# Patient Record
Sex: Female | Born: 1950 | Race: White | Hispanic: No | Marital: Single | Smoking: Never smoker
Health system: Southern US, Community
[De-identification: ages and names within clinical notes are randomized; demographics above are authoritative.]

## PROBLEM LIST (undated history)

## (undated) DIAGNOSIS — Z87442 Personal history of urinary calculi: Secondary | ICD-10-CM

## (undated) DIAGNOSIS — Z8709 Personal history of other diseases of the respiratory system: Secondary | ICD-10-CM

## (undated) DIAGNOSIS — M797 Fibromyalgia: Secondary | ICD-10-CM

## (undated) DIAGNOSIS — Z9889 Other specified postprocedural states: Secondary | ICD-10-CM

## (undated) DIAGNOSIS — M549 Dorsalgia, unspecified: Secondary | ICD-10-CM

## (undated) DIAGNOSIS — Z8669 Personal history of other diseases of the nervous system and sense organs: Secondary | ICD-10-CM

## (undated) DIAGNOSIS — K649 Unspecified hemorrhoids: Secondary | ICD-10-CM

## (undated) DIAGNOSIS — Z8489 Family history of other specified conditions: Secondary | ICD-10-CM

## (undated) DIAGNOSIS — H269 Unspecified cataract: Secondary | ICD-10-CM

## (undated) DIAGNOSIS — Z9289 Personal history of other medical treatment: Secondary | ICD-10-CM

## (undated) DIAGNOSIS — G4700 Insomnia, unspecified: Secondary | ICD-10-CM

## (undated) DIAGNOSIS — F329 Major depressive disorder, single episode, unspecified: Secondary | ICD-10-CM

## (undated) DIAGNOSIS — F32A Depression, unspecified: Secondary | ICD-10-CM

## (undated) DIAGNOSIS — Z860100 Personal history of colon polyps, unspecified: Secondary | ICD-10-CM

## (undated) DIAGNOSIS — E039 Hypothyroidism, unspecified: Secondary | ICD-10-CM

## (undated) DIAGNOSIS — M4326 Fusion of spine, lumbar region: Secondary | ICD-10-CM

## (undated) DIAGNOSIS — R202 Paresthesia of skin: Secondary | ICD-10-CM

## (undated) DIAGNOSIS — R200 Anesthesia of skin: Secondary | ICD-10-CM

## (undated) DIAGNOSIS — E119 Type 2 diabetes mellitus without complications: Secondary | ICD-10-CM

## (undated) DIAGNOSIS — E559 Vitamin D deficiency, unspecified: Secondary | ICD-10-CM

## (undated) DIAGNOSIS — K219 Gastro-esophageal reflux disease without esophagitis: Secondary | ICD-10-CM

## (undated) DIAGNOSIS — IMO0001 Reserved for inherently not codable concepts without codable children: Secondary | ICD-10-CM

## (undated) DIAGNOSIS — Z8719 Personal history of other diseases of the digestive system: Secondary | ICD-10-CM

## (undated) DIAGNOSIS — I10 Essential (primary) hypertension: Secondary | ICD-10-CM

## (undated) DIAGNOSIS — R350 Frequency of micturition: Secondary | ICD-10-CM

## (undated) DIAGNOSIS — D693 Immune thrombocytopenic purpura: Secondary | ICD-10-CM

## (undated) DIAGNOSIS — M62838 Other muscle spasm: Secondary | ICD-10-CM

## (undated) DIAGNOSIS — R011 Cardiac murmur, unspecified: Secondary | ICD-10-CM

## (undated) DIAGNOSIS — D649 Anemia, unspecified: Secondary | ICD-10-CM

## (undated) DIAGNOSIS — R112 Nausea with vomiting, unspecified: Secondary | ICD-10-CM

## (undated) DIAGNOSIS — R413 Other amnesia: Secondary | ICD-10-CM

## (undated) DIAGNOSIS — G8929 Other chronic pain: Secondary | ICD-10-CM

## (undated) HISTORY — PX: ABDOMINAL HYSTERECTOMY: SHX81

## (undated) HISTORY — PX: CERVICAL FUSION: SHX112

## (undated) HISTORY — PX: OTHER SURGICAL HISTORY: SHX169

## (undated) HISTORY — PX: CATARACT EXTRACTION, BILATERAL: SHX1313

## (undated) HISTORY — DX: Fusion of spine, lumbar region: M43.26

## (undated) HISTORY — PX: CARDIAC CATHETERIZATION: SHX172

## (undated) HISTORY — DX: Other amnesia: R41.3

## (undated) HISTORY — PX: ESOPHAGOGASTRODUODENOSCOPY: SHX1529

## (undated) HISTORY — PX: JOINT REPLACEMENT: SHX530

## (undated) HISTORY — PX: GASTRIC BYPASS: SHX52

## (undated) HISTORY — PX: LUMBAR FUSION: SHX111

## (undated) HISTORY — PX: KNEE ARTHROSCOPY: SUR90

## (undated) HISTORY — PX: SPINAL CORD STIMULATOR IMPLANT: SHX2422

## (undated) HISTORY — PX: COLONOSCOPY: SHX174

## (undated) HISTORY — PX: TONSILLECTOMY: SUR1361

---

## 2006-11-12 ENCOUNTER — Emergency Department (HOSPITAL_COMMUNITY): Admission: EM | Admit: 2006-11-12 | Discharge: 2006-11-12 | Payer: Self-pay | Admitting: Family Medicine

## 2007-10-09 DIAGNOSIS — Z8709 Personal history of other diseases of the respiratory system: Secondary | ICD-10-CM

## 2007-10-09 HISTORY — DX: Personal history of other diseases of the respiratory system: Z87.09

## 2008-03-31 ENCOUNTER — Inpatient Hospital Stay (HOSPITAL_COMMUNITY): Admission: RE | Admit: 2008-03-31 | Discharge: 2008-04-01 | Payer: Self-pay | Admitting: Neurosurgery

## 2009-02-01 ENCOUNTER — Encounter
Admission: RE | Admit: 2009-02-01 | Discharge: 2009-02-01 | Payer: Self-pay | Admitting: Physical Medicine and Rehabilitation

## 2009-07-26 ENCOUNTER — Ambulatory Visit (HOSPITAL_BASED_OUTPATIENT_CLINIC_OR_DEPARTMENT_OTHER): Admission: RE | Admit: 2009-07-26 | Discharge: 2009-07-26 | Payer: Self-pay | Admitting: Orthopedic Surgery

## 2010-05-25 ENCOUNTER — Ambulatory Visit (HOSPITAL_BASED_OUTPATIENT_CLINIC_OR_DEPARTMENT_OTHER): Admission: RE | Admit: 2010-05-25 | Discharge: 2010-05-25 | Payer: Self-pay | Admitting: Orthopedic Surgery

## 2010-06-16 ENCOUNTER — Ambulatory Visit (HOSPITAL_BASED_OUTPATIENT_CLINIC_OR_DEPARTMENT_OTHER): Admission: RE | Admit: 2010-06-16 | Discharge: 2010-06-16 | Payer: Self-pay | Admitting: Orthopedic Surgery

## 2010-12-21 LAB — CBC
HCT: 34.3 % — ABNORMAL LOW (ref 36.0–46.0)
Hemoglobin: 11.2 g/dL — ABNORMAL LOW (ref 12.0–15.0)
MCHC: 32.7 g/dL (ref 30.0–36.0)
RBC: 4.16 MIL/uL (ref 3.87–5.11)
WBC: 4.4 10*3/uL (ref 4.0–10.5)

## 2010-12-21 LAB — BASIC METABOLIC PANEL
GFR calc Af Amer: >60 mL/min (ref >60)
GFR calc non Af Amer: >60 mL/min (ref >60)
Glucose, Bld: 121 mg/dL — ABNORMAL HIGH (ref 70–99)
Potassium: 4.7 mEq/L (ref 3.5–5.1)
Sodium: 139 mEq/L (ref 135–145)

## 2010-12-22 LAB — POCT I-STAT 4, (NA,K, GLUC, HGB,HCT)
Glucose, Bld: 138 mg/dL — ABNORMAL HIGH (ref 70–99)
HCT: 36 % (ref 36.0–46.0)
Hemoglobin: 12.2 g/dL (ref 12.0–15.0)
Potassium: 4 mEq/L (ref 3.5–5.1)
Sodium: 138 mEq/L (ref 135–145)

## 2010-12-28 ENCOUNTER — Other Ambulatory Visit: Payer: Self-pay | Admitting: Orthopedic Surgery

## 2010-12-28 ENCOUNTER — Other Ambulatory Visit (HOSPITAL_COMMUNITY): Payer: Self-pay | Admitting: Orthopedic Surgery

## 2010-12-28 ENCOUNTER — Ambulatory Visit (HOSPITAL_COMMUNITY)
Admission: RE | Admit: 2010-12-28 | Discharge: 2010-12-28 | Disposition: A | Discharge status: Home / Self Care | Payer: Medicare Other | Source: Ambulatory Visit | Attending: Orthopedic Surgery | Admitting: Orthopedic Surgery

## 2010-12-28 ENCOUNTER — Encounter (HOSPITAL_COMMUNITY): Payer: Medicare Other

## 2010-12-28 DIAGNOSIS — E039 Hypothyroidism, unspecified: Secondary | ICD-10-CM | POA: Insufficient documentation

## 2010-12-28 DIAGNOSIS — Z01818 Encounter for other preprocedural examination: Secondary | ICD-10-CM

## 2010-12-28 DIAGNOSIS — E119 Type 2 diabetes mellitus without complications: Secondary | ICD-10-CM | POA: Insufficient documentation

## 2010-12-28 DIAGNOSIS — K219 Gastro-esophageal reflux disease without esophagitis: Secondary | ICD-10-CM | POA: Insufficient documentation

## 2010-12-28 DIAGNOSIS — R0602 Shortness of breath: Secondary | ICD-10-CM | POA: Insufficient documentation

## 2010-12-28 DIAGNOSIS — M48061 Spinal stenosis, lumbar region without neurogenic claudication: Secondary | ICD-10-CM | POA: Insufficient documentation

## 2010-12-28 LAB — BASIC METABOLIC PANEL
BUN: 9 mg/dL (ref 6–23)
CO2: 25 mEq/L (ref 19–32)
Calcium: 9.7 mg/dL (ref 8.4–10.5)
GFR calc non Af Amer: >60 mL/min (ref >60)
Glucose, Bld: 129 mg/dL — ABNORMAL HIGH (ref 70–99)

## 2010-12-28 LAB — CBC
MCH: 28.3 pg (ref 26.0–34.0)
MCHC: 31.3 g/dL (ref 30.0–36.0)
MCV: 90.3 fL (ref 78.0–100.0)
Platelets: 194 10*3/uL (ref 150–400)
RDW: 17.9 % — ABNORMAL HIGH (ref 11.5–15.5)

## 2010-12-28 LAB — SURGICAL PCR SCREEN: Staphylococcus aureus: NEGATIVE

## 2011-01-02 ENCOUNTER — Inpatient Hospital Stay (HOSPITAL_COMMUNITY): Payer: Medicare Other

## 2011-01-02 ENCOUNTER — Inpatient Hospital Stay (HOSPITAL_COMMUNITY)
Admission: RE | Admit: 2011-01-02 | Discharge: 2011-01-07 | DRG: 491 | Disposition: A | Discharge status: Home Health | Payer: Medicare Other | Source: Ambulatory Visit | Attending: Orthopedic Surgery | Admitting: Orthopedic Surgery

## 2011-01-02 DIAGNOSIS — J45909 Unspecified asthma, uncomplicated: Secondary | ICD-10-CM | POA: Diagnosis present

## 2011-01-02 DIAGNOSIS — I10 Essential (primary) hypertension: Secondary | ICD-10-CM | POA: Diagnosis present

## 2011-01-02 DIAGNOSIS — E039 Hypothyroidism, unspecified: Secondary | ICD-10-CM | POA: Diagnosis present

## 2011-01-02 DIAGNOSIS — E119 Type 2 diabetes mellitus without complications: Secondary | ICD-10-CM | POA: Diagnosis present

## 2011-01-02 DIAGNOSIS — K219 Gastro-esophageal reflux disease without esophagitis: Secondary | ICD-10-CM | POA: Diagnosis present

## 2011-01-02 DIAGNOSIS — M48061 Spinal stenosis, lumbar region without neurogenic claudication: Principal | ICD-10-CM | POA: Diagnosis present

## 2011-01-02 DIAGNOSIS — Z01812 Encounter for preprocedural laboratory examination: Secondary | ICD-10-CM

## 2011-01-03 LAB — GLUCOSE, CAPILLARY
Glucose-Capillary: 146 mg/dL — ABNORMAL HIGH (ref 70–99)
Glucose-Capillary: 176 mg/dL — ABNORMAL HIGH (ref 70–99)

## 2011-01-04 LAB — GLUCOSE, CAPILLARY
Glucose-Capillary: 173 mg/dL — ABNORMAL HIGH (ref 70–99)
Glucose-Capillary: 118 mg/dL — ABNORMAL HIGH (ref 70–99)
Glucose-Capillary: 113 mg/dL — ABNORMAL HIGH (ref 70–99)

## 2011-01-05 LAB — GLUCOSE, CAPILLARY
Glucose-Capillary: 110 mg/dL — ABNORMAL HIGH (ref 70–99)
Glucose-Capillary: 131 mg/dL — ABNORMAL HIGH (ref 70–99)

## 2011-01-06 LAB — GLUCOSE, CAPILLARY
Glucose-Capillary: 141 mg/dL — ABNORMAL HIGH (ref 70–99)
Glucose-Capillary: 115 mg/dL — ABNORMAL HIGH (ref 70–99)

## 2011-01-07 LAB — GLUCOSE, CAPILLARY: Glucose-Capillary: 116 mg/dL — ABNORMAL HIGH (ref 70–99)

## 2011-01-09 NOTE — Op Note (Signed)
  [name], [name]               ACCOUNT NO.:  [id number]  MEDICAL RECORD NO.:  [id number]           PATIENT TYPE:  I  LOCATION:  1616                         FACILITY:  [facility]  PHYSICIAN:  Marlowe Kays, M.D.  DATE OF BIRTH:  01-Apr-1951  DATE OF PROCEDURE:  01/02/2011 DATE OF DISCHARGE:                              OPERATIVE REPORT   PREOPERATIVE DIAGNOSIS:  Spinal stenosis L2-L3, L3-L4, L4-L5.  POSTOPERATIVE DIAGNOSIS:  Spinal stenosis L2-L3, L3-L4, L4-L5.  OPERATION:  Decompressive laminectomy L2-L3, L3-L4, L4-L5.  SURGEON:  Marlowe Kays, MD  ASSISTANT:  Georges Lynch. Gioffre, MD  ANESTHESIA:  General.  JUSTIFICATION FOR PROCEDURE:  She is having progressive back and bilateral leg pain, refractory to nonsurgical treatment.  MRIs demonstrated significant spinal stenosis at L4-L5, moderate at L3-L4, slightly less at L2-L3.  At L5-S1, there was some very minimal foraminal stenosis, but no central stenosis.  There was no true disk herniation.  PROCEDURE:  Prophylactic antibiotics, satisfied general anesthesia, Foley catheter inserted, prone position on the Wilson frame.  Time-out performed.  Back was prepped with DuraPrep, draped in sterile field. Ioban employed.  Vertical midline incision with spinous processes what we thought were L2 and L5 tagged with Kocher clamps after a shallow invasive entrance to these areas.  Now, x-ray was taken and confirmed that we were at L2 and at L4.  Accordingly, I extended the incision distalward and we took a second x-ray with Kocher clamps confirming that we are at L2 and L5.  Soft tissue was dissected off the neural arches in this interval and self-retaining McCullough retractors applied.  First with double-action rongeurs, I removed major portion of the spinous process of L2 and superior portion of L5 and the intervening bone as well.  When the dissection became a little more complex, we brought in the microscope and continued it with  2-3 mm Kerrison rongeurs.  Towards the end of the decompression, we used hockey-stick cephalad and a Penfield 4 instrument caudad to check on the extent of our decompression.  At the conclusion of decompression, the spinal canal was widely patent both proximally and distalward and the foramina were all patent to hockey-stick.  Wound was then irrigated with sterile saline and Gelfoam soaked thrombin was placed over the dura.  Self-retaining retractors were removed.  There was good bit of oozing and I elected to use a 0.25 inch Penrose drain to the posterior flank.  We carefully closed the wound with the Penrose drain under direct visualization with interrupted #1 Vicryl and the paralumbar muscle and fascia as well as the deep subcutaneous tissue, 2-0 Vicryl superficial subcutaneous tissue, staples in the skin.  Betadine, Adaptic, dry sterile dressing were applied.  At the time of this dictation, she was on her way to the recovery room in satisfactory addition, with no known complications.  Estimated blood loss 275 cc.  No blood replacement.          ______________________________ Marlowe Kays, M.D.     JA/MEDQ  D:  01/02/2011  T:  01/03/2011  Job:  [id number]  Electronically Signed by Marlowe Kays M.D. on 01/09/2011 03:40:47 PM

## 2011-01-09 NOTE — H&P (Signed)
NAMEWINNI, EHRHARD               ACCOUNT NO.:  [id number]  MEDICAL RECORD NO.:  [id number]           PATIENT TYPE:  LOCATION:                                 FACILITY:  PHYSICIAN:  Marlowe Kays, M.D.  DATE OF BIRTH:  January 24, 1951  DATE OF ADMISSION: DATE OF DISCHARGE:                             HISTORY & PHYSICAL   CHIEF COMPLAINT:  "Pain in my legs with numbness in my left foot."  HISTORY OF PRESENT ILLNESS:  A 60 year old white female who has been seen by [name] for continued breast progressive problems concerning her lumbar spine.  She has had evaluation by our physiatrist, Dr. Ethelene Hal, with epidural steroid injections x2.  Unfortunately, this does not reduce her pain and discomfort.  MRI has shown spinal stenosis, moderate at L3-L4, severe at L4-5 with spondylolisthesis of L5-S1.  Lateral recess stenosis was seen at L2-3.  The stenotic area is her most concern, Dr. Simonne Come discussed with her in detail as well as shared with and shown the films of the MRI.  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  She is quite exhausted with this continuing pain and discomfort, which is essentially "worn her out."  After much discussion including the health and benefits of surgery, it was felt that she would benefit with the surgical procedure and she is being admitted for same.  CURRENT MEDICATIONS: 1. Synthroid 25 mcg tablet daily. 2. Metformin 500 mg tablet 2 tablets by mouth twice a day. 3. Flexeril 10 mg tablet 3 times a day as needed. 4. Vicodin for discomfort. 5. Wellbutrin XL 150 mg tablet daily. 6. Restoril 15 mg tablet p.r.n. h.s. 7. Vitamin D. 8. Ferrous sulfate. 9. Cozaar 100 mg tablet daily. 10.Celexa 40 mg tablets daily. 11.Glucophage 5 mg 1 tablet b.i.d. 12.Neurontin 300 mg caps 3 times a day. 13.Estradiol acetate oral 1 mg p.o. daily. 14.Nexium 40 mg every morning before breakfast. 15.Flonase 50 mcg  actuation nasal spray, 1 spray each nostril daily. 16.Phenergan 25 mg p.r.n. 17.She uses butalbital p.r.n. for migraine headaches. 18.She use ammonium iodide, potassium iodide, topical for p.r.n.     rashes.  Also, 0.75% metronidazole twice a day p.r.n. rash. 19.Zyrtec 10 mg daily. 20.Scopolamine tabs 1 on the dermis every 3rd day p.r.n. cruises.  PAST MEDICAL HISTORY: 1. Positive for intermittent migraines. 2. Difficulty with vision at night. 3. She has depression as well. 4. Hypertension. 5. Heart murmur. 6. Reflux. 7. Hypothyroidism. 8. Frequent urinary tract infections. 9. Diabetes type 2. 10.Chronic anemia.  PAST SURGICAL HISTORY:  Right knee surgeries done in 2011, renal calculi in 2007, broken left arm with open reduction in 2006, tummy tuck in 2005, gastric bypass in 2004, hysterectomy in 2000, tonsillectomy as a child, and tubal ligation in 1984.  FAMILY HISTORY:  Positive for father deceased secondary to suicide. Mother deceased with lung cancer.  SOCIAL HISTORY:  The patient is single.  She is a Designer, jewellery.  No intake of alcohol products or tobacco products.  She lives alone, but has family support.  She has a one-level home.  REVIEW OF SYSTEMS:  CNS:  No seizure disorder or paralysis, she does have numbness in her foot.  CARDIOVASCULAR:  No chest pain, no angina, no orthopnea.  RESPIRATORY:  No productive cough, no hemoptysis, or no shortness of breath.  GASTROINTESTINAL:  No nausea, vomiting, melena, or bloody stool.  GENITOURINARY:  No discharge, dysuria, or hematuria, but she does have frequent urinary tract infections.  MUSCULOSKELETAL: Primarily in present illness.  PHYSICAL EXAMINATION:  GENERAL:  Alert, cooperative, friendly 60 year old white female. VITAL SIGNS:  Blood pressure, left arm seated, 118/68; pulse 76 and regular; respirations 18, unlabored. HEENT:  Normocephalic.  PERRLA.  EOM intact.  Oropharynx is clear. CHEST:  Clear to  auscultation.  No rhonchi, no rales. HEART:  Regular rate and rhythm.  No murmurs are heard. ABDOMEN:  Soft, obese.  Liver and spleen, not felt. GENITALIA/RECTAL/PELVIC/BREASTS:  Not done, not pertinent to present illness. EXTREMITIES:  The patient has no deformity, relatively good motor function.  She walks an aid.  ADMISSION DIAGNOSES: 1. Spinal stenosis, L2-L3, L3-L4, L5-L5. 2. Noninsulin diabetic. 3. Hypothyroidism. 4. Reflux.  PLAN:  The patient will undergo decompressive lumbar laminectomy at L2- L3, L3-L4, L4-L5.  Plan to have home health along with her family helping her after her discharge home.  Anticipated hospital stay is 2-3 days.  Gavin Pound and I went over today the perioperative plan for this surgical intervention.     [name].   ______________________________ Marlowe Kays, M.D.    DLU/MEDQ  D:  12/28/2010  T:  12/29/2010  Job:  [id number]  Electronically Signed by Marlowe Kays M.D. on 01/09/2011 03:40:41 PM

## 2011-01-11 LAB — GLUCOSE, CAPILLARY: Glucose-Capillary: 129 mg/dL — ABNORMAL HIGH (ref 70–99)

## 2011-01-11 LAB — POCT I-STAT 4, (NA,K, GLUC, HGB,HCT)
Hemoglobin: 12.6 g/dL (ref 12.0–15.0)
Potassium: 4 mEq/L (ref 3.5–5.1)

## 2011-01-24 NOTE — Discharge Summary (Signed)
[name], [name]               ACCOUNT NO.:  [id number]  MEDICAL RECORD NO.:  [id number]           PATIENT TYPE:  I  LOCATION:  1616                         FACILITY:  [facility]  PHYSICIAN:  Marlowe Kays, M.D.  DATE OF BIRTH:  28-Jul-1951  DATE OF ADMISSION:  01/02/2011 DATE OF DISCHARGE:  01/07/2011                              DISCHARGE SUMMARY   ADMITTING DIAGNOSIS:  Spinal stenosis, L2-L3, L3-L4, L4-L5.  DISCHARGE DIAGNOSIS:  Spinal stenosis, L2-L3, L3-L4, L4-L5.  OPERATION:  On January 02, 2011, the patient underwent decompressive lumbar laminectomy, L2-L3, L3-L4, L4-L5, Dr. Ranee Gosselin assisted.  BRIEF HISTORY:  This lady who has been seen by [name] for progressive pain in her low back as well as radiating in the lower legs.  Nonsurgical treatment did not seem to help her and we eventually went for an MRI, which showed significant spinal stenosis at L4-L5 with moderate L3-L4. Some was also seen at L2-L3.  Minimal foraminal stenosis, but no central stenosis seen at L5-S1.  After much discussion including the risks and benefits of surgery, it was decided she would benefit with the above procedure and was admitted for same.  COURSE IN THE HOSPITAL:  The patient tolerated surgical procedure quite well.  Physical Therapy evaluated her and she was able to ambulate in the hall at least for 150 feet.  Wound remained essentially clean and dry, was changed appropriately.  She had her own pain medications as well as muscle relaxants at home.  It was decided to place her on Coumadin protocol postoperatively, which be followed by [facility].  Dr. Jillyn Hidden saw the patient on the day of discharge.  She was out of bed, had marked reduction in the left leg pain and lower extremity pain.  He felt that she could be maintained in her home environment and arrangements made for discharge.  The patient was discharged on her home medications.  She will use, 1. Flexeril as a muscle  relaxant. 2. Metformin XR 500 mg twice daily. 3. Foltrin 1 cap daily. 4. Scopolamine patch every 3 days as needed. 5. Vitamin B12 one tablet daily. 6. Diphenhydramine 25 mg every 6 hours as needed. 7. Zyrtec 10 mg dose 1 tablet daily. 8. Metronidazole topical lotion as needed. 9. Ferrous sulfate 325 mg every morning. 10.Hydrocodone as needed for pain. 11.Citalopram 40 mg 1 tablet daily at bedtime. 12.Estradiol 1 tablet daily at bedtime. 13.Vitamin D 3 units as previously taken. 14.Levothroid 25 mcg every morning. 15.Nexium 40 mg every morning. 16.Glucosamine, she was taking at home. 17.We will start her on potassium 100 mEq dose every morning. 18.Wellbutrin XL 1 tablet every morning. 19.Gabapentin 300 mg cap 3 times a day.  Continue with her home medication and diet as well.  She is to return to the office 2 weeks after the date of surgery, they even use dry dressing p.r.n. to her low back, continue with home physical therapy, ambulating ad lib.  She may shower 4 days after the date of surgery.     [name].   ______________________________ Marlowe Kays, M.D.    DLU/MEDQ  D:  01/23/2011  T:  01/24/2011  Job:  [id number]  Electronically Signed by Marlowe Kays M.D. on 01/24/2011 07:34:56 AM

## 2011-02-20 NOTE — Op Note (Signed)
[name], [name]               ACCOUNT NO.:  [id number]   MEDICAL RECORD NO.:  [id number]          PATIENT TYPE:  INP   LOCATION:  3035                         FACILITY:  [facility]   PHYSICIAN:  Hilda Lias, M.D.   DATE OF BIRTH:  August 15, 1951   DATE OF PROCEDURE:  03/31/2008  DATE OF DISCHARGE:                               OPERATIVE REPORT   PREOPERATIVE DIAGNOSIS:  C4-C5, C5-C6, and C6-C7 spondylosis, stenosis  and chronic radiculopathy.   POSTOPERATIVE DIAGNOSIS:  C4-C5, C5-C6, and C6-C7 spondylosis, stenosis  and chronic radiculopathy.   PROCEDURE:  1. Anterior C4-5, C5-6 and C6-7 diskectomy.  2. Decompression of the spinal cord.  3. Interbody fusion with allograft and autograft with DPX.  4. Microscope.   SURGEON:  Hilda Lias, MD   ASSISTANT:  Hewitt Shorts, MD   CLINICAL HISTORY:  Ms. Pauling is a lady who has been complaining of neck  pain radiating to both upper extremities associated with weakness.  This  lady is seen and was supposed to have a corpectomy in [place], [place] where she worked.  But insurance company, there was some  question and she was sent to [name] for further evaluation.  Clinically, we  found that she had weakness of deltoid, biceps and triceps with a severe  stenosis at those two levels.  Surgery was advised.   PROCEDURE:  The patient was taken to the OR and after intubation, the  left side of the neck was cleaned with DuraPrep.  Transverse incision  was made through the skin, subcutaneous tissue down to the cervical  spine.  The patient has a short neck and we were able to see only the  needle at the level of C4-5.  We brought the microscope into the area.  The patient has an anterior osteophyte at the posterior level.  They  were removed.  To get into the disk space, we had to drill the anterior  ligament which was calcified.  Total gross diskectomy was achieved with  decompression of the posterior ligament, as well as with  decompression  of the spinal cord after we opened the posterior ligament.  Then,  centrally and laterally, decompression was achieved using the 1 and 2 mm  Kerrison punch, as well as the drill with plenty of room for the C5  nerve root.  Essentially, the same procedure was done at the level C5-6  and C6-7 with accomplishment of decompression and space for the C6-7  nerve root.  Having done that, the area was irrigated.  We drilled the  endplates.  Then 3 grafts of allograft with autograft and PBX and the  needle was inserted.  It was lordotic 7 mm height.  This was followed by  a plate using eight screws.  Lateral cervical  spine showed that the top of C4-5 and C5-6 were normal.  Because of the  shoulder, we were unable to see the C6-7.  The area was irrigated and  once we accomplished good hemostasis, the wound was closed with Vicryl  and Steri-Strips.  ______________________________  Hilda Lias, M.D.     EB/MEDQ  D:  03/31/2008  T:  04/01/2008  Job:  [id number]

## 2011-03-22 ENCOUNTER — Ambulatory Visit (HOSPITAL_BASED_OUTPATIENT_CLINIC_OR_DEPARTMENT_OTHER)
Admission: RE | Admit: 2011-03-22 | Discharge: 2011-03-22 | Disposition: A | Discharge status: Home / Self Care | Payer: Medicare Other | Source: Ambulatory Visit | Attending: Orthopedic Surgery | Admitting: Orthopedic Surgery

## 2011-03-22 DIAGNOSIS — X58XXXA Exposure to other specified factors, initial encounter: Secondary | ICD-10-CM | POA: Insufficient documentation

## 2011-03-22 DIAGNOSIS — M1710 Unilateral primary osteoarthritis, unspecified knee: Secondary | ICD-10-CM | POA: Insufficient documentation

## 2011-03-22 DIAGNOSIS — IMO0002 Reserved for concepts with insufficient information to code with codable children: Secondary | ICD-10-CM | POA: Insufficient documentation

## 2011-03-22 LAB — POCT I-STAT 4, (NA,K, GLUC, HGB,HCT)
Glucose, Bld: 163 mg/dL — ABNORMAL HIGH (ref 70–99)
HCT: 39 % (ref 36.0–46.0)
Potassium: 4.3 mEq/L (ref 3.5–5.1)
Sodium: 136 mEq/L (ref 135–145)

## 2011-04-04 NOTE — Op Note (Signed)
NAMECALIYA, [name]               ACCOUNT NO.:  [id number]  MEDICAL RECORD NO.:  [id number]  LOCATION:                                 FACILITY:  PHYSICIAN:  Marlowe Kays, M.D.  DATE OF BIRTH:  09-18-1951  DATE OF PROCEDURE:  03/22/2011 DATE OF DISCHARGE:                              OPERATIVE REPORT   PREOPERATIVE DIAGNOSES: 1. 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 2. Osteoarthritis of left knee.  POSTOPERATIVE DIAGNOSES: 1. 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 2. Osteoarthritis of left knee.  OPERATION: 1. Right knee arthroscopy with partial medial meniscectomy, shaving of     medial femoral condyle. 2. Intra-articular injection of left knee with 80 mg of Depo-Medrol.  SURGEON:  Marlowe Kays, MD  ASSISTANT:  Nurse.  ANESTHESIA:  General.  PATHOLOGY AND JUSTIFICATION FOR PROCEDURE:  I had arthroscoped her right knee some 18 years ago.  She had a recent fall with pain medially and an MRI demonstrated the preoperative diagnoses.  These were confirmed at surgery.  PROCEDURE:  Satisfied general anesthesia, Ace wrap and knee support to left lower extremity, pneumatic tourniquet applied to right lower extremity with leg Esmarched out nonsterilely and tourniquet inflated to 325 mmHg, right thigh stabilizer was applied.  The right leg was then prepped with DuraPrep from stabilizer to ankle and draped in sterile field.  Time-out performed.  Through the transpatellar tendon approach technique and a Betadine prep, I injected 80 mg of Depo-Medrol mixed with 2 cc of Marcaine into her left knee joint.  In the right knee, superomedial saline inflow through first an anterolateral portal, medial compartment of knee joint was evaluated.  She has marked wear of the joint.  Her ACL appeared to be intact.  There was a good bit of desquamation of the articular cartilage from medial femoral condyle, particularly adjacent to the  ACL.  I smoothed all this down with a combination of baskets and a 3.5 shaver.  Posteriorly she did have the tears depicted on MRI with a radial type tear of the posterior intercondylar root of the medial meniscus, which I resected back to stable rim with a combination of baskets and a 3.5 shaver.  Final pictures were taken.  It should be noted that she does have full- thickness wear of the medial tibial plateau as well in the midportion. Looking at the medial gutter and suprapatellar area, she had a good bit of wear of her patella, which I shaved down until smooth.  She has full thickness wear there.  Next, through an anteromedial portal, I evaluated the lateral joint.  She has minimal wear and fraying of lateral meniscus and so nothing required surgery.  Knee joint was then irrigated until clear and all fluid possibly removed.  I closed 2 anterior portals with 4-0 nylon and then injected through the inflow apparatus, 20 cc of 0.5% Marcaine with adrenaline, 4 mg of morphine.  This portal was then closed with 4-0 nylon as well.  Betadine, Adaptic, dry sterile dressing were applied.  Tourniquet was released.  At time of this dictation, she was on her way to recovery room in satisfactory condition with no  known complications.          ______________________________ Marlowe Kays, M.D.     JA/MEDQ  D:  03/22/2011  T:  03/22/2011  Job:  [id number]  Electronically Signed by Marlowe Kays M.D. on 04/04/2011 02:22:15 PM

## 2011-07-05 LAB — BASIC METABOLIC PANEL
CO2: 24
Calcium: 9.6
Chloride: 99
Creatinine, Ser: 0.73
GFR calc Af Amer: >60
Glucose, Bld: 154 — ABNORMAL HIGH

## 2011-07-05 LAB — CBC
MCHC: 33.4
MCV: 91.8
RBC: 4.57
RDW: 12.9

## 2011-09-21 ENCOUNTER — Other Ambulatory Visit: Payer: Self-pay | Admitting: Orthopedic Surgery

## 2011-09-21 NOTE — H&P (Signed)
  Give 2 gm ancef iv pre-op

## 2011-10-09 DIAGNOSIS — Z9289 Personal history of other medical treatment: Secondary | ICD-10-CM

## 2011-10-09 HISTORY — DX: Personal history of other medical treatment: Z92.89

## 2011-10-24 ENCOUNTER — Inpatient Hospital Stay (HOSPITAL_COMMUNITY): Admission: RE | Admit: 2011-10-24 | Payer: Medicare Other | Source: Ambulatory Visit | Admitting: Orthopedic Surgery

## 2011-10-24 ENCOUNTER — Encounter (HOSPITAL_COMMUNITY): Admission: RE | Payer: Self-pay | Source: Ambulatory Visit

## 2011-10-24 SURGERY — ARTHROPLASTY, KNEE, TOTAL
Anesthesia: General | Site: Knee | Laterality: Right

## 2014-12-30 ENCOUNTER — Ambulatory Visit: Payer: Medicare Other | Admitting: Internal Medicine

## 2015-01-05 ENCOUNTER — Other Ambulatory Visit: Payer: Self-pay | Admitting: Neurosurgery

## 2015-01-05 DIAGNOSIS — M5127 Other intervertebral disc displacement, lumbosacral region: Secondary | ICD-10-CM

## 2015-01-17 ENCOUNTER — Ambulatory Visit
Admission: RE | Admit: 2015-01-17 | Discharge: 2015-01-17 | Disposition: A | Discharge status: Home / Self Care | Payer: Medicare Other | Source: Ambulatory Visit | Attending: Neurosurgery | Admitting: Neurosurgery

## 2015-01-17 DIAGNOSIS — M5127 Other intervertebral disc displacement, lumbosacral region: Secondary | ICD-10-CM

## 2015-01-17 MED ORDER — IOHEXOL 300 MG/ML  SOLN: 10.0000 mL | Freq: Once | INTRAMUSCULAR | Status: AC | PRN

## 2015-01-17 MED ORDER — DIAZEPAM 5 MG PO TABS
5.0000 mg | ORAL_TABLET | Freq: Once | ORAL | Status: AC
  Administered 2015-01-17: 10 mg via ORAL

## 2015-01-17 NOTE — Discharge Instructions (Signed)

## 2015-01-24 ENCOUNTER — Other Ambulatory Visit: Payer: Self-pay | Admitting: Neurosurgery

## 2015-02-02 NOTE — Pre-Procedure Instructions (Signed)
[name]  02/02/2015   Your procedure is scheduled on:  Tues, May 3 @ 11:00 AM  Report to [facility] and report to Admitting at 8:00 AM.  Call this number if you have problems the morning of surgery: [phone]   Remember:   Do not eat food or drink liquids after midnight.   Take these medicines the morning of surgery with A SIP OF WATER: Gabapentin(Neurontin),Synthroid(Levothyroxine),and Pain Pill(if needed)              Stop taking your Mobic. No Goody's,BC's,Aleve,Aspirin,Ibuprofen,Fish Oil,or any Herbal Medications.    Do not wear jewelry, make-up or nail polish.  Do not wear lotions, powders, or perfumes. You may wear deodorant.  Do not shave 48 hours prior to surgery.   Do not bring valuables to the hospital.  [facility] is not responsible                  for any belongings or valuables.               Contacts, dentures or bridgework may not be worn into surgery.  Leave suitcase in the car. After surgery it may be brought to your room.  For patients admitted to the hospital, discharge time is determined by your                treatment team.                Special Instructions:  [place] - Preparing for Surgery  Before surgery, you can play an important role.  Because skin is not sterile, your skin needs to be as free of germs as possible.  You can reduce the number of germs on you skin by washing with CHG (chlorahexidine gluconate) soap before surgery.  CHG is an antiseptic cleaner which kills germs and bonds with the skin to continue killing germs even after washing.  Please DO NOT use if you have an allergy to CHG or antibacterial soaps.  If your skin becomes reddened/irritated stop using the CHG and inform your nurse when you arrive at Short Stay.  Do not shave (including legs and underarms) for at least 48 hours prior to the first CHG shower.  You may shave your face.  Please follow these instructions carefully:   1.  Shower with CHG Soap the  night before surgery and the                                morning of Surgery.  2.  If you choose to wash your hair, wash your hair first as usual with your       normal shampoo.  3.  After you shampoo, rinse your hair and body thoroughly to remove the                      Shampoo.  4.  Use CHG as you would any other liquid soap.  You can apply chg directly       to the skin and wash gently with scrungie or a clean washcloth.  5.  Apply the CHG Soap to your body ONLY FROM THE NECK DOWN.        Do not use on open wounds or open sores.  Avoid contact with your eyes,       ears, mouth and genitals (private parts).  Wash genitals (private parts)  with your normal soap.  6.  Wash thoroughly, paying special attention to the area where your surgery        will be performed.  7.  Thoroughly rinse your body with warm water from the neck down.  8.  DO NOT shower/wash with your normal soap after using and rinsing off       the CHG Soap.  9.  Pat yourself dry with a clean towel.            10.  Wear clean pajamas.            11.  Place clean sheets on your bed the night of your first shower and do not        sleep with pets.  Day of Surgery  Do not apply any lotions/deoderants the morning of surgery.  Please wear clean clothes to the hospital/surgery center.    Please read over the following fact sheets that you were given: Pain Booklet, Coughing and Deep Breathing, Blood Transfusion Information, MRSA Information and Surgical Site Infection Prevention

## 2015-02-03 ENCOUNTER — Encounter (HOSPITAL_COMMUNITY): Payer: Self-pay

## 2015-02-03 ENCOUNTER — Other Ambulatory Visit: Payer: Self-pay

## 2015-02-03 ENCOUNTER — Encounter (HOSPITAL_COMMUNITY)
Admission: RE | Admit: 2015-02-03 | Discharge: 2015-02-03 | Disposition: A | Discharge status: Home / Self Care | Payer: Medicare Other | Source: Ambulatory Visit | Attending: Neurosurgery | Admitting: Neurosurgery

## 2015-02-03 HISTORY — DX: Insomnia, unspecified: G47.00

## 2015-02-03 HISTORY — DX: Personal history of urinary calculi: Z87.442

## 2015-02-03 HISTORY — DX: Anemia, unspecified: D64.9

## 2015-02-03 HISTORY — DX: Frequency of micturition: R35.0

## 2015-02-03 HISTORY — DX: Nausea with vomiting, unspecified: R11.2

## 2015-02-03 HISTORY — DX: Reserved for inherently not codable concepts without codable children: IMO0001

## 2015-02-03 HISTORY — DX: Essential (primary) hypertension: I10

## 2015-02-03 HISTORY — DX: Major depressive disorder, single episode, unspecified: F32.9

## 2015-02-03 HISTORY — DX: Other muscle spasm: M62.838

## 2015-02-03 HISTORY — DX: Personal history of other diseases of the nervous system and sense organs: Z86.69

## 2015-02-03 HISTORY — DX: Anesthesia of skin: R20.0

## 2015-02-03 HISTORY — DX: Personal history of other medical treatment: Z92.89

## 2015-02-03 HISTORY — DX: Unspecified cataract: H26.9

## 2015-02-03 HISTORY — DX: Paresthesia of skin: R20.2

## 2015-02-03 HISTORY — DX: Personal history of colonic polyps: Z86.010

## 2015-02-03 HISTORY — DX: Personal history of colon polyps, unspecified: Z86.0100

## 2015-02-03 HISTORY — DX: Cardiac murmur, unspecified: R01.1

## 2015-02-03 HISTORY — DX: Hypothyroidism, unspecified: E03.9

## 2015-02-03 HISTORY — DX: Other specified postprocedural states: Z98.890

## 2015-02-03 HISTORY — DX: Immune thrombocytopenic purpura: D69.3

## 2015-02-03 HISTORY — DX: Gastro-esophageal reflux disease without esophagitis: K21.9

## 2015-02-03 HISTORY — DX: Family history of other specified conditions: Z84.89

## 2015-02-03 HISTORY — DX: Vitamin D deficiency, unspecified: E55.9

## 2015-02-03 HISTORY — DX: Type 2 diabetes mellitus without complications: E11.9

## 2015-02-03 HISTORY — DX: Personal history of other diseases of the digestive system: Z87.19

## 2015-02-03 HISTORY — DX: Depression, unspecified: F32.A

## 2015-02-03 HISTORY — DX: Personal history of other diseases of the respiratory system: Z87.09

## 2015-02-03 HISTORY — DX: Fibromyalgia: M79.7

## 2015-02-03 HISTORY — DX: Unspecified hemorrhoids: K64.9

## 2015-02-03 HISTORY — DX: Other chronic pain: G89.29

## 2015-02-03 HISTORY — DX: Dorsalgia, unspecified: M54.9

## 2015-02-03 LAB — CBC
HEMATOCRIT: 35.5 % — AB (ref 36.0–46.0)
Hemoglobin: 11.2 g/dL — ABNORMAL LOW (ref 12.0–15.0)
MCH: 29.4 pg (ref 26.0–34.0)
MCHC: 31.5 g/dL (ref 30.0–36.0)
MCV: 93.2 fL (ref 78.0–100.0)
Platelets: 179 10*3/uL (ref 150–400)
RBC: 3.81 MIL/uL — ABNORMAL LOW (ref 3.87–5.11)
RDW: 14.1 % (ref 11.5–15.5)
WBC: 5.1 10*3/uL (ref 4.0–10.5)

## 2015-02-03 LAB — TYPE AND SCREEN
ABO/RH(D): O POS
ANTIBODY SCREEN: NEGATIVE

## 2015-02-03 LAB — SURGICAL PCR SCREEN
MRSA, PCR: NEGATIVE
STAPHYLOCOCCUS AUREUS: NEGATIVE

## 2015-02-03 LAB — BASIC METABOLIC PANEL
ANION GAP: 8 (ref 5–15)
BUN: 10 mg/dL (ref 6–23)
CALCIUM: 9.2 mg/dL (ref 8.4–10.5)
CO2: 22 mmol/L (ref 19–32)
Chloride: 107 mmol/L (ref 96–112)
Creatinine, Ser: 0.98 mg/dL (ref 0.50–1.10)
GFR calc Af Amer: 69 mL/min — ABNORMAL LOW (ref >90)
GFR, EST NON AFRICAN AMERICAN: 59 mL/min — AB (ref >90)
Glucose, Bld: 103 mg/dL — ABNORMAL HIGH (ref 70–99)
Potassium: 4.5 mmol/L (ref 3.5–5.1)
SODIUM: 137 mmol/L (ref 135–145)

## 2015-02-03 LAB — ABO/RH: ABO/RH(D): O POS

## 2015-02-03 NOTE — Progress Notes (Addendum)
Medical Md is Dr.Janice Bobette Mo with [facility] in [place] [phone]-requested last visit from 2 days ago along with last EKG for comparison(last EKG on file there was 2014)  Cardiologist is Dr.Chris Stephanie Coup in [place] with last visit was about 6-33yrs ago  Echo possibly done about 20+yrs ago but not positive  stress test done at least 72yrs ago  Heart cath done 27yrs ago  Denies EKG/CXR in past yr

## 2015-02-04 ENCOUNTER — Encounter (HOSPITAL_COMMUNITY): Payer: Self-pay

## 2015-02-04 NOTE — Progress Notes (Signed)
Anesthesia Chart Review:  Patient is a 64 year old female scheduled for L2-3, L3-4, L4-5 PLIF on 02/08/15 by Dr. Joya Salm.  History includes HTN, murmur (not specified, but reported negative echo 20 years ago), SOB, hypothyroidism, depression, DM2, GERD, hiatal hernia, fibromyalgia, ITP, anemia, insomnia, nephrolithiasis, migraines, hysterectomy, C4-7 ACDF '09, laminectomy '12, multiple bilateral knee arthroscopies ~ '10 -'12 with bilateral TKA 10/2011, gastric bypass '04, post-operative N/V. BMI is consistent with obesity. PCP is Dr. Georgina Peer at [facility] ([place]). She saw patient on 02/01/15 and is aware of plans for surgery.  Meds include Zyrtec, Flexeril, Nexium, Estrace, Neurontin, metformin, levothyroxine, Micardis, Restoril.  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 Interpreting cardiologist Dr. Johnsie Cancel did not feel that it was significantly changed since prior EKG on 07/26/09.   She reported prior echo ~ 20 years ago that was "not positive," stress test > 10 years ago, cath 15 year ago, but did not report any diagnosis of CAD. She reported seeing a cardiologist is the past, Dr. Carroll Kinds in [place], but > 5 years ago.   Preoperative labs noted.   Patient's EKG was felt stable for at least the past five years.  She has undergone multiple surgeries since then including bilateral TKA in 2013. She did not report known history of CAD. No murmur was documented at her recent PCP visit. Her PCP is aware of plans for surgery. Further evaluation by her assigned anesthesiologist on the day of surgery, but if no acute changes then I would anticipate that she could proceed as planned.  George Hugh [facility] Short Stay Center/Anesthesiology Phone [phone] 02/04/2015 12:04 PM

## 2015-02-07 MED ORDER — CEFAZOLIN SODIUM-DEXTROSE 2-3 GM-% IV SOLR
2.0000 g | INTRAVENOUS | Status: AC
  Administered 2015-02-08 (×2): 2 g via INTRAVENOUS
  Filled 2015-02-07: qty 50

## 2015-02-07 NOTE — H&P (Signed)
[name] is an 64 y.o. female.   Chief Complaint: pain right leg HPI: patient who had a lumbar discectomy by an orhopedic doctor because as she described ,numbness in both legs. Lately because on continuation of pain she was advised two spine procedures. since i did her neck fusion in the past  She came to our office. The pain goes to the right quadripes and gets worse while sitting. She did have an outpatient myelogram.  Past Medical History  Diagnosis Date  . Hypothyroidism     takes Synthroid daily  . Insomnia     takes Restoril nightly as needed   . Hypertension     takes Micardis daily  . Diabetes mellitus without complication     takes Metformin daily  . Muscle spasm     takes Flexeril daily as needed  . Cataracts, bilateral   . History of kidney stones     has a kidney stone now  . PONV (postoperative nausea and vomiting)   . Heart murmur   . Shortness of breath dyspnea     with exertion but states its bc she can't exercise  . History of bronchitis 2009  . History of migraine     hasn't had one in over a yr  . Numbness and tingling     both toes  . Fibromyalgia     takes Gabapentin daily  . Chronic back pain     scoliosis/displacement of lumbosacral intervertebral disc/stenosis  . History of hiatal hernia   . GERD (gastroesophageal reflux disease)     takes Nexium daily  . Hemorrhoids   . History of colon polyps     benign  . Urinary frequency   . Anemia   . History of blood transfusion 2013    no abnormal reaction noted  . Idiopathic thrombocytopenia   . Vitamin D deficiency   . Depression     but doens't take any meds  . History of surgery on arm     plates and screws  . Family history of adverse reaction to anesthesia     pts mom would get short of breath after anesthesia but was a smoker    Past Surgical History  Procedure Laterality Date  . Abdominal hysterectomy    . Tonsillectomy    . Knee arthroscopy      total of 7 between both knees  .  Joint replacement Bilateral   . Cervical fusion    . Kidney stone removed    . Gastric bypass    . Tummy tuck     . Colonoscopy    . Esophagogastroduodenoscopy    . Cardiac catheterization  59yrs ago  . Left arm surgery      pins    No family history on file. Social History:  reports that she has never smoked. She does not have any smokeless tobacco history on file. She reports that she does not drink alcohol or use illicit drugs.  Allergies:  Allergies  Allergen Reactions  . Morphine And Related     itching    No prescriptions prior to admission    No results found for this or any previous visit (from the past 48 hour(s)). No results found.  Review of Systems  Constitutional: Negative.   HENT: Positive for tinnitus.   Eyes: Negative.   Respiratory: Negative.   Cardiovascular: Negative.   Gastrointestinal: Negative.   Genitourinary: Negative.   Musculoskeletal: Positive for back pain.  Skin: Negative.  Neurological: Positive for sensory change and focal weakness.  Endo/Heme/Allergies: Negative.   Psychiatric/Behavioral: Positive for depression.    There were no vitals taken for this visit. Physical Exam hent, nl. Neck, anterior scar. Cv, nl. Lungs, clear. Abdomen, soft. Extremities scar in both knees for replacements. NEURO STRENGTH NL. DECREASE OF LUMBAR FLEXIBILITY. slr POSITIVE BILATERALLY AT 60 DEGREES. FEMORAL STRETCH positive bilaterally. Lumbar myelogram and CT shows ddd at l23,34 34. Scoliosis spondylolisthesis at l34 which is  The site of the laminectomies.   Assessment/Plan Patient to go ahead with decompression from l2 to l5 with pedicles screws and cages as well as PLA. Will take a look at left l12. She is aware of risks and benefits  Evea Sheek M 02/07/2015, 5:27 PM

## 2015-02-08 ENCOUNTER — Inpatient Hospital Stay (HOSPITAL_COMMUNITY): Payer: Medicare Other | Admitting: Vascular Surgery

## 2015-02-08 ENCOUNTER — Inpatient Hospital Stay (HOSPITAL_COMMUNITY): Payer: Medicare Other

## 2015-02-08 ENCOUNTER — Encounter (HOSPITAL_COMMUNITY)
Admission: RE | Disposition: A | Discharge status: Home / Self Care | Payer: Self-pay | Source: Ambulatory Visit | Attending: Neurosurgery

## 2015-02-08 ENCOUNTER — Inpatient Hospital Stay (HOSPITAL_COMMUNITY): Payer: Medicare Other | Admitting: Anesthesiology

## 2015-02-08 ENCOUNTER — Inpatient Hospital Stay (HOSPITAL_COMMUNITY)
Admission: RE | Admit: 2015-02-08 | Discharge: 2015-02-12 | DRG: 460 | Disposition: A | Discharge status: Home / Self Care | Payer: Medicare Other | Source: Ambulatory Visit | Attending: Neurosurgery | Admitting: Neurosurgery

## 2015-02-08 ENCOUNTER — Encounter (HOSPITAL_COMMUNITY): Payer: Self-pay | Admitting: *Deleted

## 2015-02-08 DIAGNOSIS — Z885 Allergy status to narcotic agent status: Secondary | ICD-10-CM

## 2015-02-08 DIAGNOSIS — K219 Gastro-esophageal reflux disease without esophagitis: Secondary | ICD-10-CM | POA: Diagnosis present

## 2015-02-08 DIAGNOSIS — Z9884 Bariatric surgery status: Secondary | ICD-10-CM | POA: Diagnosis not present

## 2015-02-08 DIAGNOSIS — M4316 Spondylolisthesis, lumbar region: Secondary | ICD-10-CM | POA: Diagnosis present

## 2015-02-08 DIAGNOSIS — M4326 Fusion of spine, lumbar region: Secondary | ICD-10-CM

## 2015-02-08 DIAGNOSIS — M4806 Spinal stenosis, lumbar region: Secondary | ICD-10-CM | POA: Diagnosis present

## 2015-02-08 DIAGNOSIS — E119 Type 2 diabetes mellitus without complications: Secondary | ICD-10-CM | POA: Diagnosis present

## 2015-02-08 DIAGNOSIS — M62838 Other muscle spasm: Secondary | ICD-10-CM | POA: Diagnosis present

## 2015-02-08 DIAGNOSIS — I10 Essential (primary) hypertension: Secondary | ICD-10-CM | POA: Diagnosis not present

## 2015-02-08 DIAGNOSIS — Z90710 Acquired absence of both cervix and uterus: Secondary | ICD-10-CM | POA: Diagnosis not present

## 2015-02-08 DIAGNOSIS — M5116 Intervertebral disc disorders with radiculopathy, lumbar region: Secondary | ICD-10-CM | POA: Diagnosis present

## 2015-02-08 DIAGNOSIS — M419 Scoliosis, unspecified: Secondary | ICD-10-CM | POA: Diagnosis present

## 2015-02-08 DIAGNOSIS — M79604 Pain in right leg: Secondary | ICD-10-CM | POA: Diagnosis present

## 2015-02-08 DIAGNOSIS — E039 Hypothyroidism, unspecified: Secondary | ICD-10-CM | POA: Diagnosis present

## 2015-02-08 DIAGNOSIS — G4700 Insomnia, unspecified: Secondary | ICD-10-CM | POA: Diagnosis present

## 2015-02-08 DIAGNOSIS — M797 Fibromyalgia: Secondary | ICD-10-CM | POA: Diagnosis present

## 2015-02-08 LAB — GLUCOSE, CAPILLARY
GLUCOSE-CAPILLARY: 96 mg/dL (ref 70–99)
Glucose-Capillary: 169 mg/dL — ABNORMAL HIGH (ref 70–99)

## 2015-02-08 SURGERY — POSTERIOR LUMBAR FUSION 3 LEVEL
Anesthesia: General | Site: Spine Lumbar

## 2015-02-08 MED ORDER — IRBESARTAN 75 MG PO TABS
37.5000 mg | ORAL_TABLET | Freq: Every day | ORAL | Status: DC
  Administered 2015-02-09 – 2015-02-12 (×4): 37.5 mg via ORAL
  Filled 2015-02-08 (×5): qty 0.5

## 2015-02-08 MED ORDER — SODIUM CHLORIDE 0.9 % IJ SOLN
3.0000 mL | Freq: Two times a day (BID) | INTRAMUSCULAR | Status: DC
  Administered 2015-02-10 – 2015-02-11 (×3): 3 mL via INTRAVENOUS

## 2015-02-08 MED ORDER — SCOPOLAMINE 1 MG/3DAYS TD PT72
MEDICATED_PATCH | TRANSDERMAL | Status: AC
  Administered 2015-02-08: 1 via TRANSDERMAL
  Filled 2015-02-08: qty 1

## 2015-02-08 MED ORDER — ROCURONIUM BROMIDE 50 MG/5ML IV SOLN
INTRAVENOUS | Status: AC
  Filled 2015-02-08: qty 1

## 2015-02-08 MED ORDER — 0.9 % SODIUM CHLORIDE (POUR BTL) OPTIME
TOPICAL | Status: DC | PRN
  Administered 2015-02-08: 1000 mL

## 2015-02-08 MED ORDER — DIPHENHYDRAMINE HCL 12.5 MG/5ML PO ELIX: 12.5000 mg | ORAL_SOLUTION | Freq: Four times a day (QID) | ORAL | Status: DC | PRN

## 2015-02-08 MED ORDER — ESTRADIOL 1 MG PO TABS
1.0000 mg | ORAL_TABLET | Freq: Every day | ORAL | Status: DC
  Administered 2015-02-09 – 2015-02-12 (×4): 1 mg via ORAL
  Filled 2015-02-08 (×4): qty 1

## 2015-02-08 MED ORDER — FENTANYL CITRATE (PF) 250 MCG/5ML IJ SOLN
INTRAMUSCULAR | Status: AC
  Filled 2015-02-08: qty 5

## 2015-02-08 MED ORDER — FENTANYL CITRATE (PF) 100 MCG/2ML IJ SOLN
INTRAMUSCULAR | Status: DC | PRN
  Administered 2015-02-08 (×4): 50 ug via INTRAVENOUS
  Administered 2015-02-08: 200 ug via INTRAVENOUS
  Administered 2015-02-08 (×2): 50 ug via INTRAVENOUS

## 2015-02-08 MED ORDER — DEXAMETHASONE SODIUM PHOSPHATE 10 MG/ML IJ SOLN
INTRAMUSCULAR | Status: DC | PRN
  Administered 2015-02-08: 10 mg via INTRAVENOUS

## 2015-02-08 MED ORDER — OXYCODONE-ACETAMINOPHEN 5-325 MG PO TABS
ORAL_TABLET | ORAL | Status: AC
  Filled 2015-02-08: qty 2

## 2015-02-08 MED ORDER — METFORMIN HCL 500 MG PO TABS
1000.0000 mg | ORAL_TABLET | Freq: Two times a day (BID) | ORAL | Status: DC
  Administered 2015-02-08 – 2015-02-12 (×8): 1000 mg via ORAL
  Filled 2015-02-08 (×8): qty 2

## 2015-02-08 MED ORDER — ZOLPIDEM TARTRATE 5 MG PO TABS
5.0000 mg | ORAL_TABLET | Freq: Every evening | ORAL | Status: DC | PRN
  Administered 2015-02-08 – 2015-02-12 (×4): 5 mg via ORAL
  Filled 2015-02-08 (×4): qty 1

## 2015-02-08 MED ORDER — BUPIVACAINE LIPOSOME 1.3 % IJ SUSP
20.0000 mL | INTRAMUSCULAR | Status: AC
  Filled 2015-02-08: qty 20

## 2015-02-08 MED ORDER — NEOSTIGMINE METHYLSULFATE 10 MG/10ML IV SOLN
INTRAVENOUS | Status: AC
  Filled 2015-02-08: qty 1

## 2015-02-08 MED ORDER — MIDAZOLAM HCL 2 MG/2ML IJ SOLN
INTRAMUSCULAR | Status: AC
  Filled 2015-02-08: qty 2

## 2015-02-08 MED ORDER — PHENYLEPHRINE HCL 10 MG/ML IJ SOLN
INTRAMUSCULAR | Status: DC | PRN
  Administered 2015-02-08 (×5): 80 ug via INTRAVENOUS

## 2015-02-08 MED ORDER — LIDOCAINE HCL (CARDIAC) 20 MG/ML IV SOLN
INTRAVENOUS | Status: AC
  Filled 2015-02-08: qty 5

## 2015-02-08 MED ORDER — ONDANSETRON HCL 4 MG/2ML IJ SOLN: 4.0000 mg | Freq: Four times a day (QID) | INTRAMUSCULAR | Status: DC | PRN

## 2015-02-08 MED ORDER — GLYCOPYRROLATE 0.2 MG/ML IJ SOLN
INTRAMUSCULAR | Status: DC | PRN
Start: 2015-02-08 — End: 2015-02-08
  Administered 2015-02-08: 0.6 mg via INTRAVENOUS

## 2015-02-08 MED ORDER — ONDANSETRON HCL 4 MG/2ML IJ SOLN
INTRAMUSCULAR | Status: AC
  Filled 2015-02-08: qty 2

## 2015-02-08 MED ORDER — CYCLOBENZAPRINE HCL 10 MG PO TABS
ORAL_TABLET | ORAL | Status: AC
  Filled 2015-02-08: qty 1

## 2015-02-08 MED ORDER — LEVOTHYROXINE SODIUM 25 MCG PO TABS
25.0000 ug | ORAL_TABLET | Freq: Every day | ORAL | Status: DC
  Administered 2015-02-09 – 2015-02-12 (×4): 25 ug via ORAL
  Filled 2015-02-08 (×4): qty 1

## 2015-02-08 MED ORDER — HYDROMORPHONE HCL 1 MG/ML IJ SOLN
INTRAMUSCULAR | Status: AC
  Filled 2015-02-08: qty 1

## 2015-02-08 MED ORDER — SODIUM CHLORIDE 0.9 % IV SOLN
INTRAVENOUS | Status: DC | PRN
  Administered 2015-02-08: 15:00:00 via INTRAVENOUS

## 2015-02-08 MED ORDER — GABAPENTIN 600 MG PO TABS
600.0000 mg | ORAL_TABLET | Freq: Three times a day (TID) | ORAL | Status: DC
  Administered 2015-02-08 – 2015-02-12 (×11): 600 mg via ORAL
  Filled 2015-02-08 (×11): qty 1

## 2015-02-08 MED ORDER — SODIUM CHLORIDE 0.9 % IJ SOLN
INTRAMUSCULAR | Status: AC
  Filled 2015-02-08: qty 10

## 2015-02-08 MED ORDER — ROCURONIUM BROMIDE 100 MG/10ML IV SOLN
INTRAVENOUS | Status: DC | PRN
  Administered 2015-02-08 (×2): 10 mg via INTRAVENOUS
  Administered 2015-02-08: 40 mg via INTRAVENOUS
  Administered 2015-02-08 (×3): 10 mg via INTRAVENOUS

## 2015-02-08 MED ORDER — CEFAZOLIN SODIUM 1-5 GM-% IV SOLN
1.0000 g | Freq: Three times a day (TID) | INTRAVENOUS | Status: AC
  Administered 2015-02-08 – 2015-02-09 (×2): 1 g via INTRAVENOUS
  Filled 2015-02-08 (×2): qty 50

## 2015-02-08 MED ORDER — NALOXONE HCL 0.4 MG/ML IJ SOLN: 0.4000 mg | INTRAMUSCULAR | Status: DC | PRN

## 2015-02-08 MED ORDER — DIPHENHYDRAMINE HCL 50 MG/ML IJ SOLN
12.5000 mg | Freq: Four times a day (QID) | INTRAMUSCULAR | Status: DC | PRN
Start: 2015-02-08 — End: 2015-02-09
  Filled 2015-02-08: qty 1

## 2015-02-08 MED ORDER — METOCLOPRAMIDE HCL 5 MG/ML IJ SOLN
INTRAMUSCULAR | Status: DC | PRN
  Administered 2015-02-08: 10 mg via INTRAVENOUS

## 2015-02-08 MED ORDER — LIDOCAINE HCL (CARDIAC) 20 MG/ML IV SOLN
INTRAVENOUS | Status: DC | PRN
  Administered 2015-02-08: 60 mg via INTRAVENOUS

## 2015-02-08 MED ORDER — LACTATED RINGERS IV SOLN
INTRAVENOUS | Status: DC | PRN
  Administered 2015-02-08 (×3): via INTRAVENOUS

## 2015-02-08 MED ORDER — CYCLOBENZAPRINE HCL 10 MG PO TABS
10.0000 mg | ORAL_TABLET | Freq: Three times a day (TID) | ORAL | Status: DC | PRN
  Administered 2015-02-08 – 2015-02-11 (×9): 10 mg via ORAL
  Filled 2015-02-08 (×9): qty 1

## 2015-02-08 MED ORDER — PANTOPRAZOLE SODIUM 40 MG PO TBEC
40.0000 mg | DELAYED_RELEASE_TABLET | Freq: Every day | ORAL | Status: DC
Start: 2015-02-08 — End: 2015-02-12
  Administered 2015-02-09 – 2015-02-12 (×4): 40 mg via ORAL
  Filled 2015-02-08 (×4): qty 1

## 2015-02-08 MED ORDER — ACETAMINOPHEN 650 MG RE SUPP: 650.0000 mg | RECTAL | Status: DC | PRN

## 2015-02-08 MED ORDER — HYDROMORPHONE HCL 1 MG/ML IJ SOLN
0.2500 mg | INTRAMUSCULAR | Status: DC | PRN
  Administered 2015-02-08 (×4): 0.5 mg via INTRAVENOUS

## 2015-02-08 MED ORDER — SODIUM CHLORIDE 0.9 % IJ SOLN: 9.0000 mL | INTRAMUSCULAR | Status: DC | PRN

## 2015-02-08 MED ORDER — HYDROMORPHONE HCL 1 MG/ML IJ SOLN
INTRAMUSCULAR | Status: DC | PRN
  Administered 2015-02-08 (×2): 0.5 mg via INTRAVENOUS

## 2015-02-08 MED ORDER — FENTANYL 10 MCG/ML IV SOLN
INTRAVENOUS | Status: DC
Start: 2015-02-08 — End: 2015-02-09
  Administered 2015-02-08: 16:00:00 via INTRAVENOUS
  Administered 2015-02-08: 240 ug via INTRAVENOUS
  Administered 2015-02-08: 60 ug via INTRAVENOUS
  Administered 2015-02-09: 120 ug via INTRAVENOUS
  Administered 2015-02-09: 45 ug via INTRAVENOUS
  Administered 2015-02-09: 120 ug via INTRAVENOUS
  Filled 2015-02-08 (×3): qty 50

## 2015-02-08 MED ORDER — EPHEDRINE SULFATE 50 MG/ML IJ SOLN
INTRAMUSCULAR | Status: AC
  Filled 2015-02-08: qty 1

## 2015-02-08 MED ORDER — VANCOMYCIN HCL 1000 MG IV SOLR
INTRAVENOUS | Status: DC | PRN
  Administered 2015-02-08 (×2): 1000 mg

## 2015-02-08 MED ORDER — MIDAZOLAM HCL 5 MG/5ML IJ SOLN
INTRAMUSCULAR | Status: DC | PRN
  Administered 2015-02-08: 2 mg via INTRAVENOUS

## 2015-02-08 MED ORDER — SODIUM CHLORIDE 0.9 % IV SOLN
250.0000 mL | INTRAVENOUS | Status: DC
  Administered 2015-02-08: 250 mL via INTRAVENOUS

## 2015-02-08 MED ORDER — NEOSTIGMINE METHYLSULFATE 10 MG/10ML IV SOLN
INTRAVENOUS | Status: DC | PRN
  Administered 2015-02-08: 4 mg via INTRAVENOUS

## 2015-02-08 MED ORDER — LACTATED RINGERS IV SOLN: INTRAVENOUS | Status: DC

## 2015-02-08 MED ORDER — SUCCINYLCHOLINE CHLORIDE 20 MG/ML IJ SOLN
INTRAMUSCULAR | Status: AC
  Filled 2015-02-08: qty 1

## 2015-02-08 MED ORDER — GLYCOPYRROLATE 0.2 MG/ML IJ SOLN
INTRAMUSCULAR | Status: AC
  Filled 2015-02-08: qty 3

## 2015-02-08 MED ORDER — PHENOL 1.4 % MT LIQD
1.0000 | OROMUCOSAL | Status: DC | PRN
  Administered 2015-02-08: 1 via OROMUCOSAL
  Filled 2015-02-08: qty 177

## 2015-02-08 MED ORDER — ONDANSETRON HCL 4 MG/2ML IJ SOLN
INTRAMUSCULAR | Status: DC | PRN
  Administered 2015-02-08: 4 mg via INTRAVENOUS

## 2015-02-08 MED ORDER — SODIUM CHLORIDE 0.9 % IV SOLN
INTRAVENOUS | Status: DC
  Administered 2015-02-10: 04:00:00 via INTRAVENOUS

## 2015-02-08 MED ORDER — OXYCODONE-ACETAMINOPHEN 5-325 MG PO TABS
1.0000 | ORAL_TABLET | ORAL | Status: DC | PRN
  Administered 2015-02-08 – 2015-02-12 (×11): 2 via ORAL
  Administered 2015-02-12 (×2): 1 via ORAL
  Filled 2015-02-08 (×5): qty 2
  Filled 2015-02-08 (×2): qty 1
  Filled 2015-02-08 (×6): qty 2

## 2015-02-08 MED ORDER — METOCLOPRAMIDE HCL 5 MG/ML IJ SOLN
INTRAMUSCULAR | Status: AC
  Filled 2015-02-08: qty 2

## 2015-02-08 MED ORDER — ONDANSETRON HCL 4 MG/2ML IJ SOLN
4.0000 mg | INTRAMUSCULAR | Status: DC | PRN
  Filled 2015-02-08: qty 2

## 2015-02-08 MED ORDER — THROMBIN 20000 UNITS EX SOLR
CUTANEOUS | Status: DC | PRN
  Administered 2015-02-08: 20 mL via TOPICAL

## 2015-02-08 MED ORDER — EPHEDRINE SULFATE 50 MG/ML IJ SOLN
INTRAMUSCULAR | Status: DC | PRN
  Administered 2015-02-08 (×2): 10 mg via INTRAVENOUS
  Administered 2015-02-08: 15 mg via INTRAVENOUS

## 2015-02-08 MED ORDER — ACETAMINOPHEN 325 MG PO TABS
650.0000 mg | ORAL_TABLET | ORAL | Status: DC | PRN
  Administered 2015-02-09 – 2015-02-11 (×3): 650 mg via ORAL
  Filled 2015-02-08 (×3): qty 2

## 2015-02-08 MED ORDER — PHENYLEPHRINE 40 MCG/ML (10ML) SYRINGE FOR IV PUSH (FOR BLOOD PRESSURE SUPPORT)
PREFILLED_SYRINGE | INTRAVENOUS | Status: AC
  Filled 2015-02-08: qty 10

## 2015-02-08 MED ORDER — VANCOMYCIN HCL 1000 MG IV SOLR
INTRAVENOUS | Status: AC
  Filled 2015-02-08: qty 2000

## 2015-02-08 MED ORDER — SODIUM CHLORIDE 0.9 % IJ SOLN: 3.0000 mL | INTRAMUSCULAR | Status: DC | PRN

## 2015-02-08 MED ORDER — ALBUMIN HUMAN 5 % IV SOLN
INTRAVENOUS | Status: DC | PRN
  Administered 2015-02-08: 13:00:00 via INTRAVENOUS

## 2015-02-08 MED ORDER — PROPOFOL 10 MG/ML IV BOLUS
INTRAVENOUS | Status: DC | PRN
  Administered 2015-02-08: 150 mg via INTRAVENOUS

## 2015-02-08 MED ORDER — PROMETHAZINE HCL 25 MG/ML IJ SOLN: 6.2500 mg | INTRAMUSCULAR | Status: DC | PRN

## 2015-02-08 MED ORDER — MENTHOL 3 MG MT LOZG: 1.0000 | LOZENGE | OROMUCOSAL | Status: DC | PRN

## 2015-02-08 MED ORDER — ARTIFICIAL TEARS OP OINT
TOPICAL_OINTMENT | OPHTHALMIC | Status: DC | PRN
  Administered 2015-02-08: 1 via OPHTHALMIC

## 2015-02-08 MED ORDER — ARTIFICIAL TEARS OP OINT
TOPICAL_OINTMENT | OPHTHALMIC | Status: AC
  Filled 2015-02-08: qty 3.5

## 2015-02-08 MED ORDER — PROPOFOL 10 MG/ML IV BOLUS
INTRAVENOUS | Status: AC
  Filled 2015-02-08: qty 20

## 2015-02-08 MED ORDER — DEXAMETHASONE SODIUM PHOSPHATE 10 MG/ML IJ SOLN
INTRAMUSCULAR | Status: AC
  Filled 2015-02-08: qty 1

## 2015-02-08 SURGICAL SUPPLY — 70 items
APL SKNCLS STERI-STRIP NONHPOA (GAUZE/BANDAGES/DRESSINGS) ×1
BENZOIN TINCTURE PRP APPL 2/3 (GAUZE/BANDAGES/DRESSINGS) ×2 IMPLANT
BLADE CLIPPER SURG (BLADE) IMPLANT
BUR ACORN 6.0 (BURR) ×2 IMPLANT
BUR MATCHSTICK NEURO 3.0 LAGG (BURR) ×2 IMPLANT
CANISTER SUCT 3000ML PPV (MISCELLANEOUS) ×2 IMPLANT
CAP LOCKING THREADED (Cap) ×8 IMPLANT
CONT SPEC 4OZ CLIKSEAL STRL BL (MISCELLANEOUS) ×2 IMPLANT
COVER BACK TABLE 60X90IN (DRAPES) ×2 IMPLANT
CROSSLINK SPINAL 38-50MM (Neuro Prosthesis/Implant) ×1 IMPLANT
DRAPE C-ARM 42X72 X-RAY (DRAPES) ×4 IMPLANT
DRAPE LAPAROTOMY 100X72X124 (DRAPES) ×2 IMPLANT
DRAPE POUCH INSTRU U-SHP 10X18 (DRAPES) ×2 IMPLANT
DRAPE PROXIMA HALF (DRAPES) ×1 IMPLANT
DRSG OPSITE POSTOP 4X8 (GAUZE/BANDAGES/DRESSINGS) ×1 IMPLANT
DRSG PAD ABDOMINAL 8X10 ST (GAUZE/BANDAGES/DRESSINGS) IMPLANT
DURAPREP 26ML APPLICATOR (WOUND CARE) ×2 IMPLANT
ELECT REM PT RETURN 9FT ADLT (ELECTROSURGICAL) ×2
ELECTRODE REM PT RTRN 9FT ADLT (ELECTROSURGICAL) ×1 IMPLANT
EVACUATOR 1/8 PVC DRAIN (DRAIN) IMPLANT
GAUZE SPONGE 4X4 12PLY STRL (GAUZE/BANDAGES/DRESSINGS) ×2 IMPLANT
GAUZE SPONGE 4X4 16PLY XRAY LF (GAUZE/BANDAGES/DRESSINGS) ×2 IMPLANT
GLOVE BIO SURGEON STRL SZ7 (GLOVE) ×3 IMPLANT
GLOVE BIOGEL M 8.0 STRL (GLOVE) ×2 IMPLANT
GLOVE BIOGEL PI IND STRL 7.5 (GLOVE) IMPLANT
GLOVE BIOGEL PI INDICATOR 7.5 (GLOVE) ×5
GLOVE ECLIPSE 7.5 STRL STRAW (GLOVE) ×2 IMPLANT
GLOVE EXAM NITRILE LRG STRL (GLOVE) IMPLANT
GLOVE EXAM NITRILE MD LF STRL (GLOVE) IMPLANT
GLOVE EXAM NITRILE XL STR (GLOVE) IMPLANT
GLOVE EXAM NITRILE XS STR PU (GLOVE) IMPLANT
GOWN STRL REUS W/ TWL LRG LVL3 (GOWN DISPOSABLE) ×1 IMPLANT
GOWN STRL REUS W/ TWL XL LVL3 (GOWN DISPOSABLE) ×1 IMPLANT
GOWN STRL REUS W/TWL 2XL LVL3 (GOWN DISPOSABLE) IMPLANT
GOWN STRL REUS W/TWL LRG LVL3 (GOWN DISPOSABLE) ×4
GOWN STRL REUS W/TWL XL LVL3 (GOWN DISPOSABLE) ×2
KIT BASIN OR (CUSTOM PROCEDURE TRAY) ×2 IMPLANT
KIT INFUSE LRG II (Orthopedic Implant) ×1 IMPLANT
KIT ROOM TURNOVER OR (KITS) ×2 IMPLANT
NDL HYPO 18GX1.5 BLUNT FILL (NEEDLE) IMPLANT
NDL HYPO 21X1.5 SAFETY (NEEDLE) IMPLANT
NDL HYPO 25X1 1.5 SAFETY (NEEDLE) IMPLANT
NEEDLE HYPO 18GX1.5 BLUNT FILL (NEEDLE) IMPLANT
NEEDLE HYPO 21X1.5 SAFETY (NEEDLE) IMPLANT
NEEDLE HYPO 25X1 1.5 SAFETY (NEEDLE) IMPLANT
NS IRRIG 1000ML POUR BTL (IV SOLUTION) ×2 IMPLANT
PACK LAMINECTOMY NEURO (CUSTOM PROCEDURE TRAY) ×2 IMPLANT
PAD ARMBOARD 7.5X6 YLW CONV (MISCELLANEOUS) ×6 IMPLANT
PATTIES SURGICAL .5 X1 (DISPOSABLE) ×2 IMPLANT
PATTIES SURGICAL .5 X3 (DISPOSABLE) IMPLANT
ROD CREO 100MM SPINAL (Rod) ×2 IMPLANT
SCREW SPINE 40X5.5XPA CREO (Screw) IMPLANT
SCREW SPINE CREO 5.5X40 (Screw) ×16 IMPLANT
SPACER RISE 8X22 11-17MM-15 (Spacer) ×2 IMPLANT
SPACER RISE 8X22 8-14MM-10 (Neuro Prosthesis/Implant) ×2 IMPLANT
SPONGE LAP 4X18 X RAY DECT (DISPOSABLE) IMPLANT
SPONGE NEURO XRAY DETECT 1X3 (DISPOSABLE) IMPLANT
SPONGE SURGIFOAM ABS GEL 100 (HEMOSTASIS) ×2 IMPLANT
STRIP CLOSURE SKIN 1/2X4 (GAUZE/BANDAGES/DRESSINGS) ×2 IMPLANT
SUT VIC AB 1 CT1 18XBRD ANBCTR (SUTURE) ×2 IMPLANT
SUT VIC AB 1 CT1 8-18 (SUTURE) ×4
SUT VIC AB 2-0 CP2 18 (SUTURE) ×4 IMPLANT
SUT VIC AB 3-0 SH 8-18 (SUTURE) ×4 IMPLANT
SYR 20CC LL (SYRINGE) IMPLANT
SYR 20ML ECCENTRIC (SYRINGE) ×2 IMPLANT
SYR 5ML LL (SYRINGE) IMPLANT
TOWEL OR 17X24 6PK STRL BLUE (TOWEL DISPOSABLE) ×2 IMPLANT
TOWEL OR 17X26 10 PK STRL BLUE (TOWEL DISPOSABLE) ×2 IMPLANT
TRAY FOLEY CATH 14FRSI W/METER (CATHETERS) ×2 IMPLANT
WATER STERILE IRR 1000ML POUR (IV SOLUTION) ×2 IMPLANT

## 2015-02-08 NOTE — Anesthesia Procedure Notes (Signed)
Procedure Name: Intubation Date/Time: 02/08/2015 10:56 AM Performed by: Trixie Deis A Pre-anesthesia Checklist: Patient identified, Timeout performed, Emergency Drugs available, Suction available and Patient being monitored Patient Re-evaluated:Patient Re-evaluated prior to inductionOxygen Delivery Method: Circle system utilized Preoxygenation: Pre-oxygenation with 100% oxygen Intubation Type: IV induction Ventilation: Mask ventilation without difficulty Laryngoscope Size: Mac and 3 Grade View: Grade I Tube type: Oral Tube size: 7.0 mm Number of attempts: 1 Airway Equipment and Method: Stylet Placement Confirmation: ETT inserted through vocal cords under direct vision,  breath sounds checked- equal and bilateral and positive ETCO2 Secured at: 21 cm Tube secured with: Tape Dental Injury: Teeth and Oropharynx as per pre-operative assessment

## 2015-02-08 NOTE — Anesthesia Preprocedure Evaluation (Signed)
Anesthesia Evaluation  Patient identified by MRN, date of birth, ID band Patient awake    Reviewed: Allergy & Precautions, NPO status , Patient's Chart, lab work & pertinent test results  History of Anesthesia Complications (+) PONV  Airway Mallampati: II  TM Distance: >3 FB Neck ROM: Full    Dental no notable dental hx.    Pulmonary neg pulmonary ROS,  breath sounds clear to auscultation  Pulmonary exam normal       Cardiovascular hypertension, Pt. on medications Rhythm:Regular Rate:Normal     Neuro/Psych negative neurological ROS  negative psych ROS   GI/Hepatic Neg liver ROS, GERD-  Medicated,  Endo/Other  diabetes, Oral Hypoglycemic AgentsHypothyroidism   Renal/GU negative Renal ROS  negative genitourinary   Musculoskeletal negative musculoskeletal ROS (+)   Abdominal   Peds negative pediatric ROS (+)  Hematology negative hematology ROS (+)   Anesthesia Other Findings   Reproductive/Obstetrics negative OB ROS                             Anesthesia Physical Anesthesia Plan  ASA: II  Anesthesia Plan: General   Post-op Pain Management:    Induction: Intravenous  Airway Management Planned: Oral ETT  Additional Equipment:   Intra-op Plan:   Post-operative Plan: Extubation in OR  Informed Consent: I have reviewed the patients History and Physical, chart, labs and discussed the procedure including the risks, benefits and alternatives for the proposed anesthesia with the patient or authorized representative who has indicated his/her understanding and acceptance.   Dental advisory given  Plan Discussed with: CRNA and Surgeon  Anesthesia Plan Comments:         Anesthesia Quick Evaluation

## 2015-02-08 NOTE — Anesthesia Postprocedure Evaluation (Signed)
  Anesthesia Post-op Note  Patient: [name]  Procedure(s) Performed: Procedure(s) with comments: Lumbar Two-Three, Lumbar Three-Four, Lumbar Four-Five Posterior lumbar interbody fusion (N/A) - L2-3 L3-4 L4-5 Posterior lumbar interbody fusion  Patient Location: PACU  Anesthesia Type:General  Level of Consciousness: awake  Airway and Oxygen Therapy: Patient Spontanous Breathing  Post-op Pain: mild  Post-op Assessment: Post-op Vital signs reviewed  Post-op Vital Signs: Reviewed  Last Vitals:  Filed Vitals:   02/08/15 1647  BP:   Pulse: 95  Temp:   Resp: 19    Complications: No apparent anesthesia complications

## 2015-02-08 NOTE — Progress Notes (Signed)
Utilization review completed.  

## 2015-02-08 NOTE — Transfer of Care (Signed)
Immediate Anesthesia Transfer of Care Note  Patient: [name]  Procedure(s) Performed: Procedure(s) with comments: Lumbar Two-Three, Lumbar Three-Four, Lumbar Four-Five Posterior lumbar interbody fusion (N/A) - L2-3 L3-4 L4-5 Posterior lumbar interbody fusion  Patient Location: PACU  Anesthesia Type:General  Level of Consciousness: awake, alert  and oriented  Airway & Oxygen Therapy: Patient Spontanous Breathing and Patient connected to nasal cannula oxygen  Post-op Assessment: Report given to RN, Post -op Vital signs reviewed and stable and Patient moving all extremities  Post vital signs: Reviewed and stable  Last Vitals:  Filed Vitals:   02/08/15 1545  BP: 121/58  Pulse: 95  Temp: 37 C  Resp: 23    Complications: No apparent anesthesia complications

## 2015-02-09 MED ORDER — HYDROMORPHONE 0.3 MG/ML IV SOLN
INTRAVENOUS | Status: DC
  Administered 2015-02-09: 1.5 mg via INTRAVENOUS
  Administered 2015-02-09: 1.44 mg via INTRAVENOUS
  Administered 2015-02-09: 6 mg via INTRAVENOUS
  Administered 2015-02-09: 14:00:00 via INTRAVENOUS
  Administered 2015-02-10: 3.6 mg via INTRAVENOUS
  Administered 2015-02-10: 6.3 mg via INTRAVENOUS
  Administered 2015-02-10: 22:00:00 via INTRAVENOUS
  Administered 2015-02-10: 0.9 mg via INTRAVENOUS
  Administered 2015-02-10: 12:00:00 via INTRAVENOUS
  Administered 2015-02-10: 2.4 mg via INTRAVENOUS
  Administered 2015-02-11: 1.5 mg via INTRAVENOUS
  Administered 2015-02-11: 0.06 mg via INTRAVENOUS
  Filled 2015-02-09 (×4): qty 25

## 2015-02-09 MED ORDER — SODIUM CHLORIDE 0.9 % IJ SOLN: 9.0000 mL | INTRAMUSCULAR | Status: DC | PRN

## 2015-02-09 MED ORDER — DIPHENHYDRAMINE HCL 50 MG/ML IJ SOLN
12.5000 mg | Freq: Four times a day (QID) | INTRAMUSCULAR | Status: DC | PRN
  Administered 2015-02-10: 12.5 mg via INTRAVENOUS
  Filled 2015-02-09: qty 1

## 2015-02-09 MED ORDER — MANAGING BACK PAIN BOOK
Freq: Once | Status: AC
  Administered 2015-02-10: 04:00:00
  Filled 2015-02-09: qty 1

## 2015-02-09 MED ORDER — ONDANSETRON HCL 4 MG/2ML IJ SOLN: 4.0000 mg | Freq: Four times a day (QID) | INTRAMUSCULAR | Status: DC | PRN

## 2015-02-09 MED ORDER — DIPHENHYDRAMINE HCL 12.5 MG/5ML PO ELIX: 12.5000 mg | ORAL_SOLUTION | Freq: Four times a day (QID) | ORAL | Status: DC | PRN

## 2015-02-09 MED ORDER — NALOXONE HCL 0.4 MG/ML IJ SOLN: 0.4000 mg | INTRAMUSCULAR | Status: DC | PRN

## 2015-02-09 NOTE — Op Note (Signed)
[name], [name]               ACCOUNT NO.:  [id number]  MEDICAL RECORD NO.:  [id number]  LOCATION:  [id number]                        FACILITY:  [facility]  PHYSICIAN:  Leeroy Cha, M.D.   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  Scoliosis.  Chronic radiculopathy.  Status post 3-4 laminectomies.  POSTOPERATIVE DIAGNOSES:  Lumbar stenosis with spondylolisthesis of L2- 3, L3-4, L4-5.  Scoliosis.  Chronic radiculopathy.  Status post 3-4 laminectomies.  PROCEDURE:  Bilateral 2, 3, 4 laminectomy and facetectomy.  Bilateral L4- 5 diskectomy more than normal to introduce 2 expandable cages. Introduction of cage at the level of 2-3 and 3-4 on the left side. Pedicle screws L2-L3, L4-L5.  Posterolateral arthrodesis with BMP and autograft.  Laminectomies at the previous surgery 3-4 and new at the level of L2.  Lysis of adhesion.  Cell Saver, C-arm.  SURGEON:  Leeroy Cha, MD  ASSISTANT:  Dr. ________numd_ Dwyane Dee.  CLINICAL HISTORY:  The lady underwent surgery in 2009 in the lumbar area.  We do not have the operative report, but according to her, she had laminectomies in the lumbar area at the level of 3 and 4. Nevertheless, she had been complaining of back pain, radiation to both legs, left worse than the right one.  X-ray showed that she has step-off which was at the level of 2-3, 3-4 with severe degenerative disk disease and scoliosis.  Surgery was advised.  The patient knew the risks and benefits.  She already was seen initially by a second surgeon who advised the same procedure in 2 stages.  PROCEDURE IN DETAIL:  The patient was taken to the OR, and after intubation, she was positioned in a prone manner.  The back was cleaned with Betadine and DuraPrep.  Drapes were applied.  Midline incision above from the  previous one was made all the way down to the area of L4- L5.  Muscles were retracted all the way laterally.  Immediately, what we found out was although she had previous surgery, the lamina grew back together.  We started with removal of the spinous process of L2 with laminectomy of 2 bilaterally and facetectomy of L2.  We proceeded with removal of the lamina of 3 and 4.  We facetectomied bilaterally.  The patient had quite a bit of tissue and it took [name] 90 minutes to be able to decompress the thecal sac as well as the nerve root.  At the end, we entered the disk space at the L4-5, first in the right side and later on the left side.  Total gross diskectomy was done medial and laterally more than normal.  This was followed by introduction of 2 cages, expanded all the way to 70 mm.  The cages were 15 degrees lordotic.  The cages had BMP and autograft and the rest of the disk space was filled up with both materials.  At the level of 3-4 and 2-3, we went from the left side.  We introduced 2 cages that were expanded to 15 and there were  8 mm lordotic.  ___x-rays_______ show quite a bit of correction of the scoliosis.  We had plenty of space for the nerve roots.  From then on, we made holes in the pedicles of 2, 3, 4 and 5.  We filled the holes in all 4 quadrants, yet to be sure that we were surrounded by bone.  A total of 8 screws of 5.5 x 40 were introduced and kept in place with rods and caps.  CrossLink from right to left was done.  From then on, we went laterally to the lateral aspect of the facet of 2-3, 3-4, 4-5 and a mix of BMP and autograft was used for arthrodesis.  We went back again to the midline.  We filled every single foramen.  We introduced a large hook at the level of L1 and L2, and there was plenty of space for the thecal sac as well as the L1-L2 nerve root. From then on, the area was irrigated.  Valsalva maneuver up to 40 was negative.  Drains were left in the operative site  and the wound was closed with Vicryl and staples.          ______________________________ Leeroy Cha, M.D.     EB/MEDQ  D:  02/08/2015  T:  02/09/2015  Job:  [id number]

## 2015-02-09 NOTE — Evaluation (Signed)
Occupational Therapy Evaluation Patient Details Name: [name] MRN: [id number] DOB: 24-Sep-1951 Today's Date: 02/09/2015    History of Present Illness 64 yo female s/p L2-3 L3-4 L4-5 Posterior lumbar interbody fusion. PMH - back surg, neck surg   Clinical Impression   Patient is s/p L2-3 L3-4 L4-5 PLIF surgery resulting in functional limitations due to the deficits listed below (see OT problem list). PTA living at home independent . Pt planting flowers days prior to surg. Patient will benefit from skilled OT acutely to increase independence and safety with ADLS to allow discharge [place] with RW. Pt progressing well with OT at this time . Pt will have elderly aunt and friend to help with grocery store/ dogs. Pt does not have 24/7 (A) upon d/c.      Follow Up Recommendations  Home health OT    Equipment Recommendations  Other (comment) (RW)    Recommendations for Other Services       Precautions / Restrictions Precautions Precautions: Back Required Braces or Orthoses: Spinal Brace Spinal Brace: Thoracolumbosacral orthotic;Applied in sitting position Restrictions Weight Bearing Restrictions: No      Mobility Bed Mobility Overal bed mobility: Needs Assistance Bed Mobility: Supine to Sit     Supine to sit: Min assist     General bed mobility comments: cues for safety and back precautions  Transfers Overall transfer level: Needs assistance Equipment used: Rolling walker (2 wheeled) Transfers: Sit to/from Stand Sit to Stand: Min assist         General transfer comment: first time up and needed (A) to complete. Pt once standing reports having pain in feet still as prior to surg    Balance                                            ADL Overall ADL's : Needs assistance/impaired Eating/Feeding: Independent   Grooming: Wash/dry face;Oral care;Supervision/safety;Sitting Grooming Details (indicate cue type and reason): verbal cues for back  precautions. pt positioned in chair at this time to perform due to back pain Upper Body Bathing: Min guard;Sitting   Lower Body Bathing: Maximal assistance Lower Body Bathing Details (indicate cue type and reason): will need AE education. 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 Pt however reports she does plan to transfer to the bathroom when she needs to go to the bathroom. pt advised to ask for Assistance and that ambulating with RW and IV pole requires staff assistance. Pt next session we will aim to incr independence for stand pivot transfers to 3n1 but current requires (A) . Pt making face and reports understanding. Pt provided handout for back precautions and reviewed in detail.     Vision Vision Assessment?: No apparent visual deficits   Perception     Praxis      Pertinent Vitals/Pain       Hand Dominance Right   Extremity/Trunk Assessment Upper Extremity Assessment Upper Extremity Assessment: Overall WFL for tasks assessed  Lower Extremity Assessment Lower Extremity Assessment: Defer to PT evaluation   Cervical / Trunk Assessment Cervical / Trunk Assessment: Other exceptions Cervical / Trunk Exceptions: cervical surg    Communication Communication Communication: No difficulties   Cognition Arousal/Alertness: Awake/alert Behavior During Therapy: WFL for tasks assessed/performed Overall Cognitive Status: Within Functional Limits for tasks assessed                     General Comments       Exercises       Shoulder Instructions      Home  Living Family/patient expects to be discharged to:: Private residence Living Arrangements: Alone Available Help at Discharge: Family;Available PRN/intermittently (66 yo aunt and a friend ) Type of Home: House Home Access: Stairs to enter Technical brewer of Steps: 2   Home Layout: Two level Alternate Level Stairs-Number of Steps: 1 (into a den)   Bathroom Shower/Tub: Walk-in Hydrologist: Standard     Home Equipment: Bedside commode   Additional Comments: has two small dogs ( COCO and Charlie) that pt will need to be able to care for       Prior Functioning/Environment Level of Independence: Independent             OT Diagnosis: Generalized weakness;Acute pain   OT Problem List: Decreased strength;Decreased activity tolerance;Impaired balance (sitting and/or standing);Decreased safety awareness;Decreased knowledge of use of DME or AE;Decreased knowledge of precautions;Pain   OT Treatment/Interventions: Self-care/ADL training;Therapeutic exercise;DME and/or AE instruction;Therapeutic activities;Patient/family education;Balance training    OT Goals(Current goals can be found in the care plan section) Acute Rehab OT Goals Patient Stated Goal: to stay here as long as they let me before going home but I am giong home not a rehab OT Goal Formulation: With patient Time For Goal Achievement: 02/23/15 Potential to Achieve Goals: Good  OT Frequency: Min 2X/week   Barriers to D/C:            Co-evaluation              End of Session Equipment Utilized During Treatment: Rolling walker;Gait belt;Back brace Nurse Communication: Mobility status;Precautions  Activity Tolerance: Patient tolerated treatment well Patient left: in chair;with call bell/phone within reach   Time: 0751-0846 OT Time Calculation (min): 55 min Charges:  OT General Charges $OT Visit: 1 Procedure OT Evaluation $Initial OT Evaluation Tier I: 1 Procedure OT  Treatments $Self Care/Home Management : 23-37 mins G-Codes:    Parke Poisson B 19-Feb-2015, 11:12 AM Pager: [phone]

## 2015-02-09 NOTE — Progress Notes (Signed)
Wasted 31 ml IV fentanyl in sink with Meyer Cory, RN.

## 2015-02-09 NOTE — Progress Notes (Signed)
Noted PT and OT recommend Home health. Will defer rehab consult at this time . [phone]

## 2015-02-09 NOTE — Progress Notes (Signed)
Patient ID: [name], female   DOB: 1951-08-04, 64 y.o.   MRN: [id number] C/o incisional pain with spasms. No weakness. Was oob. Wants the foley in place till she can go to the BR. 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

## 2015-02-09 NOTE — Progress Notes (Signed)
Physical medicine rehabilitation consult requested with chart reviewed. Patient status post lumbar 2, 3, 4 laminectomy 02/08/2015. Await physical and occupational therapy evaluations to be completed in follow-up at that time with appropriate recommendations.

## 2015-02-09 NOTE — Evaluation (Signed)
Physical Therapy Evaluation Patient Details Name: [name] MRN: [id number] DOB: [birth year] Today's Date: 02/09/2015   History of Present Illness  64 yo female s/p L2-3 L3-4 L4-5 Posterior lumbar interbody fusion. PMH - back surg, neck surg  Clinical Impression  Pt very painful and declined attempting to ambulate at this time.  Pt lives alone and indicates she only has her 40 yr old [place] and a female friend who can provide any A, but that she would be alone primarily.  Discussed need for A at home with homemaking tasks and driving, as well as meals and helping care for her dogs.  Will need to maximize A at home and [facility] services for safety at D/C.  Will continue to follow.      Follow Up Recommendations Home health PT;Supervision - Intermittent    Equipment Recommendations   (pt wants [phone])    Recommendations for Other Services       Precautions / Restrictions Precautions Precautions: Back Precaution Booklet Issued: No Precaution Comments: Reviewed precaution.   Required Braces or Orthoses: Spinal Brace Spinal Brace: Thoracolumbosacral orthotic;Applied in sitting position Restrictions Weight Bearing Restrictions: No      Mobility  Bed Mobility Overal bed mobility: Needs Assistance Bed Mobility: Sit to Sidelying     Supine to sit: Min assist   Sit to sidelying: Min guard General bed mobility comments: cues for safety and back precautions.    Transfers Overall transfer level: Needs assistance Equipment used: Rolling walker (2 wheeled) Transfers: Sit to/from [facility] Sit to Stand: Min assist Stand pivot transfers: Min assist       General transfer comment: Cues for UE use and movement of RW through pivot to bed.    Ambulation/Gait             General Gait Details: pt declined.    Stairs            Wheelchair Mobility    Modified Rankin (Stroke Patients Only)       Balance Overall balance assessment: No apparent balance  deficits (not formally assessed)                                           Pertinent Vitals/Pain Pain Assessment: 0-10 Pain Score: 8  Pain Location: Back and hips Pain Descriptors / Indicators: Burning Pain Intervention(s): Monitored during session;Repositioned;PCA encouraged    Home Living Family/patient expects to be discharged to:: Private residence Living Arrangements: Alone Available Help at Discharge: Family;Available PRN/intermittently (69 yo aunt and a friend ) Type of Home: House Home Access: Stairs to enter Entrance Stairs-Rails: Left Entrance Stairs-Number of Steps: 2 Home Layout: Two level Home Equipment: Bedside commode;Walker - 2 wheels;Shower seat Additional Comments: has two small dogs ( COCO and Charlie) that pt will need to be able to care for     Prior Function Level of Independence: Independent               Hand Dominance   Dominant Hand: Right    Extremity/Trunk Assessment   Upper Extremity Assessment: Defer to OT evaluation           Lower Extremity Assessment: Overall WFL for tasks assessed      Cervical / Trunk Assessment: Other exceptions  Communication   Communication: No difficulties  Cognition Arousal/Alertness: Awake/alert Behavior During Therapy: WFL for tasks assessed/performed Overall Cognitive Status: Within Functional  Limits for tasks assessed                      General Comments General comments (skin integrity, edema, etc.): incision site intact    Exercises        Assessment/Plan    PT Assessment Patient needs continued PT services  PT Diagnosis Difficulty walking;Acute pain   PT Problem List Decreased activity tolerance;Decreased balance;Decreased mobility;Decreased knowledge of use of DME;Decreased knowledge of precautions;Pain  PT Treatment Interventions DME instruction;Gait training;Stair training;Functional mobility training;Therapeutic activities;Therapeutic exercise;Balance  training;Neuromuscular re-education;Patient/family education   PT Goals (Current goals can be found in the Care Plan section) Acute Rehab PT Goals Patient Stated Goal: to stay here as long as they let me before going home but I am giong home not a rehab PT Goal Formulation: With patient Time For Goal Achievement: 02/23/15 Potential to Achieve Goals: Good    Frequency Min 5X/week   Barriers to discharge Decreased caregiver support      Co-evaluation               End of Session Equipment Utilized During Treatment: Back brace;Oxygen Activity Tolerance: Patient limited by pain Patient left: in bed;with call bell/phone within reach Nurse Communication: Mobility status         Time: 1029-1050 PT Time Calculation (min) (ACUTE ONLY): 21 min   Charges:   PT Evaluation $Initial PT Evaluation Tier I: 1 Procedure     PT G CodesCatarina Hartshorn, [place] 02/09/2015, 12:17 PM

## 2015-02-09 NOTE — Clinical Social Work Note (Signed)
CSW acknowledged:  Clinical Education officer, museum received a consult for SNF placement. PT currently recommending Home Health. Clinical Social Worker will sign off for now as social work intervention is no longer needed. Please consult [name] again if new need arises.  Glendon Axe, MSW, LCSWA [phone] 02/09/2015 12:38 PM

## 2015-02-10 MED ORDER — SENNOSIDES-DOCUSATE SODIUM 8.6-50 MG PO TABS
1.0000 | ORAL_TABLET | Freq: Two times a day (BID) | ORAL | Status: DC
  Administered 2015-02-10 – 2015-02-12 (×5): 1 via ORAL
  Filled 2015-02-10 (×5): qty 1

## 2015-02-10 NOTE — Progress Notes (Signed)
Occupational Therapy Treatment Patient Details Name: [name] MRN: [id number] DOB: 1951-10-03 Today's Date: 02/10/2015    History of present illness 64 yo female s/p L2-3 L3-4 L4-5 Posterior lumbar interbody fusion. PMH - back surg, neck surg   OT comments  Pt educated on 3n1 transfer and completed transfer to help encourage d/c of foley. Pt understands risk and demonstrates this by informing OT at the mention of 3n1 transfer that she is not taking out the foley. Pt completed 3n1 and immediately states "oh no. I can not do this everytime yet. I am keeping my foley". Pt educated on d/c options and declined any option but home.      Follow Up Recommendations  Home health OT    Equipment Recommendations  Other (comment) (RW)    Recommendations for Other Services      Precautions / Restrictions Precautions Precautions: Back Precaution Comments: Reviewed precaution.   Required Braces or Orthoses: Spinal Brace Spinal Brace: Thoracolumbosacral orthotic;Applied in sitting position       Mobility Bed Mobility               General bed mobility comments: in chair on arrival  Transfers Overall transfer level: Needs assistance Equipment used: Rolling walker (2 wheeled) Transfers: Sit to/from Stand Sit to Stand: Min assist         General transfer comment: using bil UE to complete chair transfer. Pt with back brace lose upon standing and needing adjustment total (A). Pt ambualted to 3n1 and needed min (A) to complete transfer from this surface as well    Balance                                   ADL Overall ADL's : Needs assistance/impaired                       Lower Body Dressing Details (indicate cue type and reason): pt with reacher in room that was provided from OT closet at bedside. pt educated this AE is OT staff and that it can only be used while in acute. AE must be returned prior to d/c home. Pt agreeable. Pt has reacher at home . Pt  has previous knowledge of use of reacher for LB Toilet Transfer: Minimal assistance;Ambulation;BSC Toilet Transfer Details (indicate cue type and reason): pt completes task and states "oh no. I can not do this everytime yet. I am keeping my foley"         Functional mobility during ADLs: Min guard;Rolling walker General ADL Comments: pt very direct and honest this session about intentions to d/c home only. Pt asked about CIR and SNf due to pain levels and functional level. Pt states "I plan to stay here the 3 days Dr Joya Salm said I could stay and then go home and if I am not ready I am not going yet" Pt verbalized having extra pain medication at home to help with break through pain and plans to use if needed , even if not prescribed at this time. Pt plans to use microwave meals . pt educated on use of bag on RW to transport and use of plastic containers to transport. Pt will have 28 yo aunt feed dogs and use doggie door for animal care. Pt plans to sit in lift chair and use for transfers. Pt educated on decr weakness and back precautions with chair use. Pt states "well  using it a little while wont hurt me" Pt very concerned with access to her animals and care for dogs.      Vision                     Perception     Praxis      Cognition   Behavior During Therapy: WFL for tasks assessed/performed Overall Cognitive Status: Within Functional Limits for tasks assessed                       Extremity/Trunk Assessment               Exercises     Shoulder Instructions       General Comments      Pertinent Vitals/ Pain       Pain Assessment: 0-10 Pain Score: 7  Pain Location: back / back spasms Pain Descriptors / Indicators: Burning Pain Intervention(s): Monitored during session;Repositioned;RN gave pain meds during session;PCA encouraged  Home Living                                          Prior Functioning/Environment               Frequency Min 2X/week     Progress Toward Goals  OT Goals(current goals can now be found in the care plan section)  Progress towards OT goals: Progressing toward goals  Acute Rehab OT Goals Patient Stated Goal: to stay here for as long as Dr Joya Salm will let me until I can do it OT Goal Formulation: With patient Time For Goal Achievement: 02/23/15 Potential to Achieve Goals: Good ADL Goals Pt Will Perform Lower Body Bathing: with modified independence;sit to/from stand;with adaptive equipment Pt Will Perform Tub/Shower Transfer: Shower transfer;with supervision;tub bench;rolling walker Additional ADL Goal #1: Pt will complete bed mobility MOD I  Plan Discharge plan remains appropriate    Co-evaluation                 End of Session Equipment Utilized During Treatment: Gait belt;Rolling walker;Back brace   Activity Tolerance Patient tolerated treatment well   Patient Left in chair;with call bell/phone within reach;with nursing/sitter in room   Nurse Communication Mobility status;Precautions        Time: 5631-4970 OT Time Calculation (min): 34 min  Charges: OT General Charges $OT Visit: 1 Procedure OT Treatments $Self Care/Home Management : 23-37 mins  Peri Maris 02/10/2015, 12:41 PM  Pager: [phone]

## 2015-02-10 NOTE — Progress Notes (Signed)
Patient pulled hemo vac on accident. MD paged to notify. Dressing change done. Will continue to monitor.

## 2015-02-10 NOTE — Progress Notes (Signed)
Physical Therapy Treatment Patient Details Name: [name] MRN: [id number] DOB: Apr 28, 1951 Today's Date: 02/10/2015    History of Present Illness 64 yo female s/p L2-3 L3-4 L4-5 Posterior lumbar interbody fusion. PMH - back surg, neck surg    PT Comments    Max encouragement need for mobility and attempting to ambulate today.  Pt able to don brace with MinA, but then refused to tighten brace when it became loose in standing, stating "I'm not staying up long".  Pt ed on importance of increasing mobility for improved recovery and pt adamantly not interested in increasing mobility.  Pt had not been out of bed from dinner time last night until almost 11am this morning.  Educated pt that staying in bed such a long period of time is not beneficial to her pain, but pt not interested.  Pt adamant that she will stay here until she feels ready to D/C to home and is not receptive to discussion about rehabs.  Will continue to follow.    Follow Up Recommendations  Home health PT;Supervision - Intermittent     Equipment Recommendations  Rolling walker with 5" wheels    Recommendations for Other Services       Precautions / Restrictions Precautions Precautions: Back Precaution Booklet Issued: No Precaution Comments: Reviewed precautions.  pt able to verbalize, but needs cues for following.   Required Braces or Orthoses: Spinal Brace Spinal Brace: Lumbar corset;Applied in sitting position Restrictions Weight Bearing Restrictions: No    Mobility  Bed Mobility Overal bed mobility: Needs Assistance Bed Mobility: Sidelying to Sit   Sidelying to sit: Min assist;HOB elevated       General bed mobility comments: cues for sequencing and technique.  A for bringing trunk up to sitting.    Transfers Overall transfer level: Needs assistance Equipment used: Rolling walker (2 wheeled) Transfers: Sit to/from Stand Sit to Stand: Min assist         General transfer comment: cues for UE use.   Brace became loose upon coming to stand and pt refused to tighten despite education on purpose for wearing brace.    Ambulation/Gait Ambulation/Gait assistance: Min guard Ambulation Distance (Feet): 20 Feet Assistive device: Rolling walker (2 wheeled) Gait Pattern/deviations: Step-through pattern;Decreased stride length     General Gait Details: pt initially declined any ambulation, but finally agreed to ambulate to door, but not leave room.  pt ed on importance of increasing mobility, but pt adamantly not interested.     Stairs            Wheelchair Mobility    Modified Rankin (Stroke Patients Only)       Balance Overall balance assessment: No apparent balance deficits (not formally assessed)                                  Cognition Arousal/Alertness: Awake/alert Behavior During Therapy: WFL for tasks assessed/performed Overall Cognitive Status: Within Functional Limits for tasks assessed                      Exercises      General Comments        Pertinent Vitals/Pain Pain Assessment: 0-10 Pain Score: 8  Pain Location: Back Pain Descriptors / Indicators: Burning;Tightness Pain Intervention(s): Monitored during session;Repositioned;PCA encouraged    Home Living  Prior Function            PT Goals (current goals can now be found in the care plan section) Acute Rehab PT Goals Patient Stated Goal: to stay here for as long as Dr Joya Salm will let me until I can do it PT Goal Formulation: With patient Time For Goal Achievement: 02/23/15 Potential to Achieve Goals: Good Progress towards PT goals: Progressing toward goals    Frequency  Min 5X/week    PT Plan Current plan remains appropriate    Co-evaluation             End of Session Equipment Utilized During Treatment: Back brace Activity Tolerance: Patient limited by pain Patient left: in chair;with call bell/phone within reach     Time:  7473-4037 PT Time Calculation (min) (ACUTE ONLY): 18 min  Charges:  $Gait Training: 8-22 mins                    G CodesCatarina Hartshorn, [place] 02/10/2015, 2:23 PM

## 2015-02-10 NOTE — Progress Notes (Signed)
Patient ID: [name], female   DOB: 1951-06-02, 64 y.o.   MRN: [id number] Still with incisional pain. Declined to have the foley out. She is aware of risks. Continue pt/ot

## 2015-02-10 NOTE — Progress Notes (Signed)
Pain continues but better controlled with PCA Dilaudid.  VSS, afebrile, Hemovac draining 100cc, foley continued per MD order.

## 2015-02-11 MED ORDER — HYDROMORPHONE HCL 1 MG/ML IJ SOLN
0.5000 mg | INTRAMUSCULAR | Status: DC | PRN
  Administered 2015-02-11 (×2): 0.5 mg via INTRAVENOUS
  Filled 2015-02-11 (×2): qty 1

## 2015-02-11 MED ORDER — POLYETHYLENE GLYCOL 3350 17 G PO PACK
17.0000 g | PACK | Freq: Every day | ORAL | Status: DC
  Administered 2015-02-11 – 2015-02-12 (×2): 17 g via ORAL
  Filled 2015-02-11 (×2): qty 1

## 2015-02-11 MED ORDER — FLUTICASONE PROPIONATE 50 MCG/ACT NA SUSP
2.0000 | Freq: Every day | NASAL | Status: DC
  Administered 2015-02-11 – 2015-02-12 (×2): 2 via NASAL
  Filled 2015-02-11: qty 16

## 2015-02-11 MED ORDER — FLEET ENEMA 7-19 GM/118ML RE ENEM: 1.0000 | ENEMA | Freq: Every day | RECTAL | Status: DC | PRN

## 2015-02-11 NOTE — Progress Notes (Signed)
Patient ID: [name], female   DOB: Aug 15, 1951, 64 y.o.   MRN: [id number] Better, able to get oob by herself. Agrees to have the foley by this pm. Wound dry

## 2015-02-11 NOTE — Progress Notes (Signed)
Physical Therapy Treatment Patient Details Name: [name] MRN: [id number] DOB: [birth year] Today's Date: 02/11/2015    History of Present Illness 64 yo female s/p L2-3 L3-4 L4-5 Posterior lumbar interbody fusion. PMH - back surg, neck surg    PT Comments    Pt with excellent progression with mobility today able to walk in hall and tolerate more activity. Pt EOB on arrival and still needing cues to attend to and maintain precautions with mobility as well as cues for brace wear. Pt greatest issue is bed mobility and plan to have repeated practice with bed mobility next session.   Follow Up Recommendations  Home health PT;Supervision - Intermittent     Equipment Recommendations       Recommendations for Other Services       Precautions / Restrictions Precautions Precautions: Back Precaution Comments: Reviewed precautions.  pt able to verbalize, but needs cues for following.   Required Braces or Orthoses: Spinal Brace Spinal Brace: Lumbar corset;Applied in sitting position Restrictions Weight Bearing Restrictions: No    Mobility  Bed Mobility Overal bed mobility: Needs Assistance Bed Mobility: Rolling Rolling: Min guard       Sit to sidelying: Min guard General bed mobility comments: cues for sequence and precautions particularly not to twist  Transfers       Sit to Stand: Supervision         General transfer comment: cues for hand placement with sitting and standing  Ambulation/Gait Ambulation/Gait assistance: Supervision Ambulation Distance (Feet): 400 Feet Assistive device: Rolling walker (2 wheeled) Gait Pattern/deviations: Step-through pattern;Decreased stride length   Gait velocity interpretation: Below normal speed for age/gender General Gait Details: good posture and position in Rw today with greatly increased distance   Stairs Stairs: Yes Stairs assistance: Modified independent (Device/Increase time) Stair Management: One rail  Left;Alternating pattern;Forwards Number of Stairs: 3    Wheelchair Mobility    Modified Rankin (Stroke Patients Only)       Balance Overall balance assessment: Needs assistance   Sitting balance-Leahy Scale: Good       Standing balance-Leahy Scale: Fair                      Cognition Arousal/Alertness: Awake/alert Behavior During Therapy: WFL for tasks assessed/performed Overall Cognitive Status: Within Functional Limits for tasks assessed                      Exercises      General Comments        Pertinent Vitals/Pain Pain Score: 7  Pain Location: back Pain Descriptors / Indicators: Aching Pain Intervention(s): Repositioned;Monitored during session    Home Living                      Prior Function            PT Goals (current goals can now be found in the care plan section) Progress towards PT goals: Progressing toward goals    Frequency  Min 5X/week    PT Plan Current plan remains appropriate    Co-evaluation             End of Session Equipment Utilized During Treatment: Back brace Activity Tolerance: Patient tolerated treatment well Patient left: in bed;with call bell/phone within reach     Time: 4481-8563 PT Time Calculation (min) (ACUTE ONLY): 26 min  Charges:  $Gait Training: 8-22 mins $Therapeutic Activity: 8-22 mins  G CodesMelford Aase 02/18/15, 1:49 PM Elwyn Reach, [place]

## 2015-02-11 NOTE — Progress Notes (Signed)
Pt refuses VTE prophylaxis SCDs. Pt educated and understands risks of not wearing them. Pt also refuses bed alarm, was very agitated that we keep putting them on and saying she is a "fall risk". Pt was also educated on this manner. Agreed to not put bed alarm on if she calls for help to get up. Will continue to monitor.

## 2015-02-12 ENCOUNTER — Encounter (HOSPITAL_COMMUNITY): Payer: Self-pay | Admitting: General Practice

## 2015-02-12 MED ORDER — ONDANSETRON HCL 4 MG PO TABS
4.0000 mg | ORAL_TABLET | Freq: Three times a day (TID) | ORAL | Status: DC | PRN
  Administered 2015-02-12: 4 mg via ORAL
  Filled 2015-02-12: qty 1

## 2015-02-12 MED ORDER — OXYCODONE HCL 20 MG PO TABS: 20.0000 mg | ORAL_TABLET | ORAL | Status: DC | PRN

## 2015-02-12 MED ORDER — OXYCODONE HCL 5 MG PO TABS: 20.0000 mg | ORAL_TABLET | ORAL | Status: DC | PRN

## 2015-02-12 MED ORDER — OXYCODONE HCL 5 MG PO TABS
15.0000 mg | ORAL_TABLET | ORAL | Status: DC | PRN
  Administered 2015-02-12: 15 mg via ORAL
  Filled 2015-02-12: qty 3

## 2015-02-12 MED ORDER — CYCLOBENZAPRINE HCL 10 MG PO TABS: 10.0000 mg | ORAL_TABLET | Freq: Three times a day (TID) | ORAL | Status: DC | PRN

## 2015-02-12 NOTE — Discharge Summary (Signed)
Physician Discharge Summary  Patient ID: [name] MRN: [id number] DOB/AGE: 1950/10/29 64 y.o.  Admit date: 02/08/2015 Discharge date: 02/12/2015  Admission Diagnoses: L2-3, L3-4, L4-5 spondylolisthesis, disc degeneration, lumbago, lumbar radiculopathy  Discharge Diagnoses: The same Active Problems:   Spondylolisthesis of lumbar region   Discharged Condition: good  Hospital Course: Dr. Joya Salm performed in L2-3, L3-4 and L4-5 decompression, instrumentation, and fusion on the patient on 02/08/2015.  The patient's postoperative course was only remarkable for some difficulty with pain management. The patient has been taking oxycodone chronically and had a high tolerance to this medications. We increased her oxycodone to 20 mg. The patient requested discharge to home on 02/12/2015. She was given oral and written discharge instructions. All her questions were answered.  Consults: PT Significant Diagnostic Studies: None Treatments: L2-3, L3-4 and L4-5 decompression, instrumentation, and fusion. Discharge Exam: Blood pressure 112/95, pulse 120, temperature 97.8 F (36.6 C), temperature source Oral, resp. rate 18, height 5\' 3"  (1.6 m), weight 87.941 kg (193 lb 14 oz), SpO2 94 %. The patient is alert and pleasant. Her lower extremity strength is grossly normal. Her wound is healing well. She has staples in place.  Disposition: Home  Discharge Instructions    Call MD for:  difficulty breathing, headache or visual disturbances    Complete by:  As directed      Call MD for:  extreme fatigue    Complete by:  As directed      Call MD for:  hives    Complete by:  As directed      Call MD for:  persistant dizziness or light-headedness    Complete by:  As directed      Call MD for:  persistant nausea and vomiting    Complete by:  As directed      Call MD for:  redness, tenderness, or signs of infection (pain, swelling, redness, odor or green/yellow discharge around incision site)     Complete by:  As directed      Call MD for:  severe uncontrolled pain    Complete by:  As directed      Call MD for:  temperature >100.4    Complete by:  As directed      Diet - low sodium heart healthy    Complete by:  As directed      Discharge instructions    Complete by:  As directed   Call [phone] for a followup appointment. Take a stool softener while you are using pain medications.     Driving Restrictions    Complete by:  As directed   Do not drive for 2 weeks.     Increase activity slowly    Complete by:  As directed      Lifting restrictions    Complete by:  As directed   Do not lift more than 5 pounds. No excessive bending or twisting.     May shower / Bathe    Complete by:  As directed   He may shower after the pain she is removed 3 days after surgery. Leave the incision alone.     No dressing needed    Complete by:  As directed             Medication List    STOP taking these medications        meloxicam 7.5 MG tablet  Commonly known as:  MOBIC     temazepam 30 MG capsule  Commonly known as:  RESTORIL  TAKE these medications        cetirizine 10 MG tablet  Commonly known as:  ZYRTEC  Take 10 mg by mouth daily.     cholecalciferol 1000 UNITS tablet  Commonly known as:  VITAMIN D  Take 5,000 Units by mouth daily.     cyclobenzaprine 10 MG tablet  Commonly known as:  FLEXERIL  Take 10 mg by mouth 3 (three) times daily as needed for muscle spasms.     cyclobenzaprine 10 MG tablet  Commonly known as:  FLEXERIL  Take 1 tablet (10 mg total) by mouth 3 (three) times daily as needed for muscle spasms.     diphenhydrAMINE 25 MG tablet  Commonly known as:  SOMINEX  Take 75 mg by mouth at bedtime as needed for sleep.     esomeprazole 40 MG capsule  Commonly known as:  NEXIUM  Take 40 mg by mouth daily at 12 noon.     estradiol 1 MG tablet  Commonly known as:  ESTRACE  Take 1 mg by mouth daily.     gabapentin 600 MG tablet  Commonly known  as:  NEURONTIN  Take 600 mg by mouth 3 (three) times daily.     levothyroxine 25 MCG tablet  Commonly known as:  SYNTHROID, LEVOTHROID  Take 25 mcg by mouth daily before breakfast.     metFORMIN 1000 MG tablet  Commonly known as:  GLUCOPHAGE  Take 1,000 mg by mouth 2 (two) times daily with a meal.     Oxycodone HCl 20 MG Tabs  Take 1 tablet (20 mg total) by mouth every 4 (four) hours as needed for moderate pain.     telmisartan 40 MG tablet  Commonly known as:  MICARDIS  Take 40 mg by mouth daily.         SignedNewman Pies D 02/12/2015, 10:09 AM

## 2015-02-12 NOTE — Progress Notes (Signed)
Physical Therapy Treatment Patient Details Name: [name] MRN: [id number] DOB: 1951/07/27 Today's Date: 02/12/2015    History of Present Illness 64 yo female s/p L2-3 L3-4 L4-5 Posterior lumbar interbody fusion. PMH - back surg, neck surg    PT Comments    Patient requests discharge home today.  Seen for generalized mobility and review of back precautions, to which she needs frequent reminders during functional mobility.  Advised patient on potential benefit of elastic shoelaces, reacher and toilet aid to abide back precautions (and where/cost of purchase) with return demonstration understanding.  Patient slightly impulsive during treatment, highly distracted.  Patient requests 3:1 to be delivered to her home by [facility].   Follow Up Recommendations  Home health PT;Supervision - Intermittent     Equipment Recommendations  3in1 (PT) (requests to be delivered to her home by [place])    Recommendations for Other Services       Precautions / Restrictions Precautions Precautions: Back Precaution Booklet Issued: Yes (comment) Precaution Comments: Reviewed precautions.  pt able to verbalize, but needs cues for following.   (advised patient on toilet aid, reacher and elastic shoelaces) Required Braces or Orthoses: Spinal Brace Spinal Brace: Lumbar corset (on upon arrival. Patient doffed independently in bed) Restrictions Weight Bearing Restrictions: No    Mobility  Bed Mobility Overal bed mobility: Needs Assistance Bed Mobility: Sit to Sidelying         Sit to sidelying: Supervision (min cues for technique to stay within back precautions) General bed mobility comments: without rail  Transfers Overall transfer level: Independent Equipment used: Rolling walker (2 wheeled) Transfers: Sit to/from Stand Sit to Stand: Modified independent (Device/Increase time)         General transfer comment: needs assist to don  shoes  Ambulation/Gait Ambulation/Gait assistance: Modified independent (Device/Increase time) Ambulation Distance (Feet): 400 Feet Assistive device: Rolling walker (2 wheeled) Gait Pattern/deviations: WFL(Within Functional Limits)     General Gait Details: decr awareness of back precautions during functional mobility   Stairs Stairs: Yes Stairs assistance: Modified independent (Device/Increase time) Stair Management: Two rails;Alternating pattern;Forwards Number of Stairs: 3    Wheelchair Mobility    Modified Rankin (Stroke Patients Only)       Balance Overall balance assessment: Modified Independent   Sitting balance-Leahy Scale: Good       Standing balance-Leahy Scale: Good                      Cognition Arousal/Alertness: Awake/alert Behavior During Therapy: WFL for tasks assessed/performed Overall Cognitive Status: Within Functional Limits for tasks assessed                      Exercises      General Comments        Pertinent Vitals/Pain Pain Assessment: 0-10 Pain Score: 7  Pain Location: back, Rt hip and leg Pain Descriptors / Indicators: Sore;Aching Pain Intervention(s): Limited activity within patient's tolerance;Monitored during session    Home Living                      Prior Function            PT Goals (current goals can now be found in the care plan section) Acute Rehab PT Goals Patient Stated Goal: go home as soon as able so I can do my own thing PT Goal Formulation: With patient Time For Goal Achievement: 02/23/15 Potential to Achieve Goals: Good Progress towards PT  goals: Goals met/education completed, patient discharged from PT    Frequency  Min 5X/week    PT Plan Current plan remains appropriate    Co-evaluation             End of Session Equipment Utilized During Treatment: Back brace Activity Tolerance: Patient tolerated treatment well Patient left: in bed;with call bell/phone within  reach     Time: 1026-1055 PT Time Calculation (min) (ACUTE ONLY): 29 min  Charges:  $Gait Training: 8-22 mins $Therapeutic Activity: 8-22 mins                    G CodesMalka So, [place] [phone]  [place] 02/12/2015, 11:05 AM

## 2015-02-12 NOTE — Progress Notes (Signed)
Patient ready for discharge to home; discharge instructions given and reviewed; Rx given and reviewed; patient's ride has arrived; discharged out via wheelchair.

## 2015-02-27 NOTE — Addendum Note (Signed)
Addendum  created 02/27/15 2025 by Laurie Panda, MD   Modules edited: Anesthesia Events, Narrator   Narrator:  Narrator: Event Log Edited

## 2015-02-28 NOTE — Addendum Note (Signed)
Addendum  created 02/28/15 1750 by Myrtie Soman, MD   Modules edited: Anesthesia Attestations

## 2015-03-01 ENCOUNTER — Encounter: Payer: Self-pay | Admitting: Internal Medicine

## 2015-03-01 ENCOUNTER — Ambulatory Visit (INDEPENDENT_AMBULATORY_CARE_PROVIDER_SITE_OTHER): Payer: Medicare Other | Admitting: Internal Medicine

## 2015-03-01 VITALS — BP 158/68 | HR 72 | Ht 63.0 in | Wt 191.6 lb

## 2015-03-01 DIAGNOSIS — Z86010 Personal history of colonic polyps: Secondary | ICD-10-CM | POA: Diagnosis not present

## 2015-03-01 DIAGNOSIS — Z9884 Bariatric surgery status: Secondary | ICD-10-CM | POA: Diagnosis not present

## 2015-03-01 DIAGNOSIS — K219 Gastro-esophageal reflux disease without esophagitis: Secondary | ICD-10-CM | POA: Diagnosis not present

## 2015-03-01 MED ORDER — OMEPRAZOLE 20 MG PO CPDR
20.0000 mg | DELAYED_RELEASE_CAPSULE | Freq: Two times a day (BID) | ORAL | Status: DC
Start: 2015-03-01 — End: 2018-11-14

## 2015-03-01 NOTE — Progress Notes (Signed)
[name] 04/06/1951 [id number]  Note: This dictation was prepared with Dragon digital system. Any transcriptional errors that result from this procedure are unintentional.   History of Present Illness: This is a 64 year old, white female with  complains of nausea and increased reflux. She underwent  spinal surgery involving L2-3, L3-4 and L4-5 fusion by Dr. Joya Mendoza on 02/07/2005 and  during hospitalization had difficulty with  pain management. She was on  Oxycodone prior to the surgery and since the surgery  has been taking one every 4 hours , total of 6 at day . She has a history of gastric bypass for  obesity 11 years ago. She has occasional dysphagia. There is a history of  familial polyposis of the colon in her father. She has undergone numerous colonoscopies by me many years ago but we no longer have the records. She,later on, moved to [place] and we did not see her for several years. Her last colonoscopy was about 6 years ago and she reports  having had polyps removed. There is also a family history of colon polyps in her mother who was our patient. [name]  is currently on Pepcid 20 mg twice a day for acid reflux but is having breakthrough symptoms. She has type 2 diabetes,    Past Medical History  Diagnosis Date  . Hypothyroidism     takes Synthroid daily  . Insomnia     takes Restoril nightly as needed   . Hypertension     takes Micardis daily  . Diabetes mellitus without complication     takes Metformin daily  . Muscle spasm     takes Flexeril daily as needed  . Cataracts, bilateral   . History of kidney stones     has a kidney stone now  . PONV (postoperative nausea and vomiting)   . Heart murmur   . Shortness of breath dyspnea     with exertion but states its bc she can't exercise  . History of bronchitis 2009  . History of migraine     hasn't had one in over a yr  . Numbness and tingling     both toes  . Fibromyalgia     takes Gabapentin daily  . Chronic back  pain     scoliosis/displacement of lumbosacral intervertebral disc/stenosis  . History of hiatal hernia   . GERD (gastroesophageal reflux disease)     takes Nexium daily  . Hemorrhoids   . History of colon polyps     benign  . Urinary frequency   . Anemia   . History of blood transfusion 2013    no abnormal reaction noted  . Idiopathic thrombocytopenia   . Vitamin D deficiency   . Depression     but doens't take any meds  . History of surgery on arm     plates and screws  . Family history of adverse reaction to anesthesia     pts mom would get short of breath after anesthesia but was a smoker    Past Surgical History  Procedure Laterality Date  . Abdominal hysterectomy    . Tonsillectomy    . Knee arthroscopy      total of 7 between both knees  . Joint replacement Bilateral   . Cervical fusion    . Kidney stone removed    . Gastric bypass    . Tummy tuck     . Colonoscopy    . Esophagogastroduodenoscopy    . Cardiac catheterization  69yrs ago  . Left arm surgery      pins    Allergies  Allergen Reactions  . Morphine And Related     itching    Family history and social history have been reviewed.  Review of Systems: Constipation after the surgery. Gastroesophageal reflux. Occasional dysphagia to solids  The remainder of the 10 point ROS is negative except as outlined in the H&P  Physical Exam: General Appearance Well developed, in no distress Eyes  Non icteric  HEENT  Non traumatic, normocephalic  Mouth No lesion, tongue papillated, no cheilosis Neck Supple without adenopathy, thyroid not enlarged, no carotid bruits, no JVD Lungs Clear to auscultation bilaterally COR Normal S1, normal S2, regular rhythm, no murmur, quiet precordium Abdomen soft nontender with normoactive bowel sounds. No scars. Rectal soft Hemoccult-negative stool Extremities  No pedal edema Skin No lesions Neurological Alert and oriented x 3 Psychological Normal mood and  affect  Assessment and Plan:   64 year old white female who had three level  spinal fusion 4 weeks ago and is doing very well from that standpoint although she is still taking 6 oxycodone a day which is likely contributing to decreased GI motility and increased gastroesophageal reflux. She had gastric bypass surgery in the past and most likely has a small gastric remnant. Solid food dysphagia is  suggestive of mild esophageal stricture. She may have agastroparesis due to pain medications. We will proceed with upper endoscopy. We will also switch from Pepcid to Prilosec 40 mg twice a day.  I have discussed gastroparesis diet with her family history and  familial polyposis in her father. Colon polyps in her mother. Last colonoscopy 5 years ago. We don't have her records but she will be scheduled for colonoscopy and upper endoscopy. She will be scheduled for  July 2016. We'll give her enough time to recover from the back surgery      [name] 03/01/2015

## 2015-03-01 NOTE — Patient Instructions (Addendum)
Dr Georgina Peer, Dr Bayard Hugger have been scheduled for an endoscopy and colonoscopy. Please follow the written instructions given to you at your visit today. Please pick up your prep supplies at the pharmacy within the next 1-3 days. If you use inhalers (even only as needed), please bring them with you on the day of your procedure. Your physician has requested that you go to www.[email or web address].com and enter the access code given to you at your visit today. This web site gives a general overview about your procedure. However, you should still follow specific instructions given to you by our office regarding your preparation for the procedure.

## 2015-03-16 ENCOUNTER — Encounter: Payer: Self-pay | Admitting: Internal Medicine

## 2015-03-30 ENCOUNTER — Telehealth: Payer: Self-pay | Admitting: Internal Medicine

## 2015-03-31 MED ORDER — NA SULFATE-K SULFATE-MG SULF 17.5-3.13-1.6 GM/177ML PO SOLN: 1.0000 | Freq: Once | ORAL | Status: AC

## 2015-03-31 NOTE — Telephone Encounter (Signed)
Called patient at [phone] (home #) to talk to them about their Suprep. Patient stated [facility] to [facility] [place], [place]. [facility] [place], [place]. Patient's neurosurgeon, Dr. Joya Salm, did say it was okay and safe for the patient to go ahead and get their colonoscopy and endoscopy procedures on April 06, 2015 at 3:30 pm with Dr. Olevia Perches. Will send a note to Dr. Olevia Perches letting her know the patient has been cleared by doctor.

## 2015-04-04 ENCOUNTER — Other Ambulatory Visit: Payer: Self-pay

## 2015-04-06 ENCOUNTER — Encounter: Payer: Self-pay | Admitting: Internal Medicine

## 2015-04-06 ENCOUNTER — Ambulatory Visit (AMBULATORY_SURGERY_CENTER): Payer: Medicare Other | Admitting: Internal Medicine

## 2015-04-06 VITALS — BP 148/70 | HR 80 | Temp 97.6°F | Resp 17 | Ht 63.0 in | Wt 191.0 lb

## 2015-04-06 DIAGNOSIS — Z860100 Personal history of colon polyps, unspecified: Secondary | ICD-10-CM

## 2015-04-06 DIAGNOSIS — K2970 Gastritis, unspecified, without bleeding: Secondary | ICD-10-CM

## 2015-04-06 DIAGNOSIS — K219 Gastro-esophageal reflux disease without esophagitis: Secondary | ICD-10-CM

## 2015-04-06 DIAGNOSIS — K2990 Gastroduodenitis, unspecified, without bleeding: Secondary | ICD-10-CM

## 2015-04-06 DIAGNOSIS — K2950 Unspecified chronic gastritis without bleeding: Secondary | ICD-10-CM

## 2015-04-06 DIAGNOSIS — R1314 Dysphagia, pharyngoesophageal phase: Secondary | ICD-10-CM

## 2015-04-06 DIAGNOSIS — D122 Benign neoplasm of ascending colon: Secondary | ICD-10-CM

## 2015-04-06 LAB — GLUCOSE, CAPILLARY
GLUCOSE-CAPILLARY: 88 mg/dL (ref 65–99)
GLUCOSE-CAPILLARY: 90 mg/dL (ref 65–99)

## 2015-04-06 MED ORDER — SODIUM CHLORIDE 0.9 % IV SOLN: 500.0000 mL | INTRAVENOUS | Status: DC

## 2015-04-06 NOTE — Progress Notes (Signed)
Called to room to assist during endoscopic procedure.  Patient ID and intended procedure confirmed with present staff. Received instructions for my participation in the procedure from the performing physician.  

## 2015-04-06 NOTE — Op Note (Signed)
[place]  [address] [place], [address]   ENDOSCOPY PROCEDURE REPORT  PATIENT: [name], [name]  MR#: [id number] BIRTHDATE: [birth year] , 48  yrs. old GENDER: female ENDOSCOPIST: Lafayette Dragon, MD REFERRED BY:  Thayer Headings Dickerson,MD PROCEDURE DATE:  04/06/2015 PROCEDURE:  EGD w/ biopsy and EGD w/ wire guided (savary) dilation  ASA CLASS:     Class III INDICATIONS:  dysphagia, heartburn, and History of gastric bypass for obesity 11 years ago.  Now recurrent dysphagia. MEDICATIONS: Monitored anesthesia care and Propofol 250 mg IV TOPICAL ANESTHETIC: none  DESCRIPTION OF PROCEDURE: After the risks benefits and alternatives of the procedure were thoroughly explained, informed consent was obtained.  The LB [id number] endoscope was introduced through the mouth and advanced to the second portion of the duodenum , Without limitations.  The instrument was slowly withdrawn as the mucosa was fully examined.    Esophagus: benign-appearing nonobstructing esophageal stricture at 32 cm from the incisors endoscope to traverse into gastric remnant. There was no acute esophagitis.[  Stomach: there was a prior gastrectomy and gastrojejunostomy with efferent  and afferent limbs  of jejunum widely patent. There was no bile in the stomach. Gastric remnant measured 8 cm and extended from 32-40 cm from the incisors. There were multiple linear erosions in the proximal stomach were biopsies to rule out H. pylori. There was no food in the stomach  Jejunum: jejunal mucosa appeared normal in both limbs which were reviewed over the length of at least 15 cm .guidewire was placed in the gastric remnant with the tip in jejunal loop,       The scope was then withdrawn from the patient and the procedure completed.  Savary dilators passed over the guidewire difficulty. 16 mm dilator was used to dilate the esophageal stricture. There was no blood on the dilator   COMPLICATIONS:  There were no immediate complications.  ENDOSCOPIC IMPRESSION: 1. mild distal esophageal stricture. Status post dilatation to 16 mm 2. Erosive gastritis proximal gastric remnant, biopsies to rule out H. pylori 3. Status post remote gastrojejunal bypass for obesity and a patent anastomosis  RECOMMENDATIONS: 1.  Await pathology results 2.  Anti-reflux regimen to be follow 3.  Continue PPI  REPEAT EXAM: for EGD pending biopsy results.  eSigned:  Lafayette Dragon, MD 04/06/2015 4:19 PM    CC:  PATIENT NAME:  [name], [name] MR#: [id number]

## 2015-04-06 NOTE — Patient Instructions (Signed)
YOU HAD AN ENDOSCOPIC PROCEDURE TODAY AT [facility]:   Refer to the procedure report that was given to you for any specific questions about what was found during the examination.  If the procedure report does not answer your questions, please call your gastroenterologist to clarify.  If you requested that your care partner not be given the details of your procedure findings, then the procedure report has been included in a sealed envelope for you to review at your convenience later.  YOU SHOULD EXPECT: Some feelings of bloating in the abdomen. Passage of more gas than usual.  Walking can help get rid of the air that was put into your GI tract during the procedure and reduce the bloating. If you had a lower endoscopy (such as a colonoscopy or flexible sigmoidoscopy) you may notice spotting of blood in your stool or on the toilet paper. If you underwent a bowel prep for your procedure, you may not have a normal bowel movement for a few days.  Please Note:  You might notice some irritation and congestion in your nose or some drainage.  This is from the oxygen used during your procedure.  There is no need for concern and it should clear up in a day or so.  SYMPTOMS TO REPORT IMMEDIATELY:   Following lower endoscopy (colonoscopy or flexible sigmoidoscopy):  Excessive amounts of blood in the stool  Significant tenderness or worsening of abdominal pains  Swelling of the abdomen that is new, acute  Fever of 100F or higher   Following upper endoscopy (EGD)  Vomiting of blood or coffee ground material  New chest pain or pain under the shoulder blades  Painful or persistently difficult swallowing  New shortness of breath  Fever of 100F or higher  [place], tarry-looking stools  For urgent or emergent issues, a gastroenterologist can be reached at any hour by calling [phone].   DIET: See dilation diet- Drink plenty of fluids but you should avoid alcoholic beverages for 24  hours.  ACTIVITY:  You should plan to take it easy for the rest of today and you should NOT DRIVE or use heavy machinery until tomorrow (because of the sedation medicines used during the test).    FOLLOW UP: Our staff will call the number listed on your records the next business day following your procedure to check on you and address any questions or concerns that you may have regarding the information given to you following your procedure. If we do not reach you, we will leave a message.  However, if you are feeling well and you are not experiencing any problems, there is no need to return our call.  We will assume that you have returned to your regular daily activities without incident.  If any biopsies were taken you will be contacted by phone or by letter within the next 1-3 weeks.  Please call us at [phone] if you have not heard about the biopsies in 3 weeks.    SIGNATURES/CONFIDENTIALITY: You and/or your care partner have signed paperwork which will be entered into your electronic medical record.  These signatures attest to the fact that that the information above on your After Visit Summary has been reviewed and is understood.  Full responsibility of the confidentiality of this discharge information lies with you and/or your care-partner.  Polyps, hemorrhoids, diverticulosis, high fiber diet, dilation diet, stricture, gastritis, GERD-handouts given  Repeat colonoscopy will be determined by pathology.  Anti-reflux regimen in GERD handout

## 2015-04-06 NOTE — Op Note (Signed)
[place]  [address]. [place], [address]   COLONOSCOPY PROCEDURE REPORT  PATIENT: [name], [name]  MR#: [id number] BIRTHDATE: 19-Apr-1951 , 1  yrs. old GENDER: female ENDOSCOPIST: Lafayette Dragon, MD REFERRED BY:Dr Georgina Peer PROCEDURE DATE:  04/06/2015 PROCEDURE:   Colonoscopy, screening and Colonoscopy with cold biopsy polypectomy First Screening Colonoscopy - Avg.  risk and is 50 yrs.  old or older - No.  Prior Negative Screening - Now for repeat screening. N/A  History of Adenoma - Now for follow-up colonoscopy & has been > or = to 3 yrs.  Yes hx of adenoma.  Has been 3 or more years since last colonoscopy.  Polyps removed today? Yes ASA CLASS:   Class III INDICATIONS:Screening for colonic neoplasia, patient's family history of colon cancer, distant relatives, and positive family history of colonic polyposis in patient's father.  Family history of colon polyp in mother.  Personal history of colon polyps during last colonoscopy 6 years ago in [place].  Records not available. MEDICATIONS: Monitored anesthesia care and Propofol 200 mg IV  DESCRIPTION OF PROCEDURE:   After the risks benefits and alternatives of the procedure were thoroughly explained, informed consent was obtained.  The digital rectal exam revealed no abnormalities of the rectum.   The LB [id number]  endoscope was introduced through the anus and advanced to the cecum, which was identified by both the appendix and ileocecal valve. No adverse events experienced.   The quality of the prep was good.  (MoviPrep was used)  The instrument was then slowly withdrawn as the colon was fully examined. Estimated blood loss is zero unless otherwise noted in this procedure report.      COLON FINDINGS: A sessile polyp measuring 4 mm in size was found in the ascending colon.  A polypectomy was performed with cold forceps.  The resection was complete, the polyp tissue was completely  retrieved and sent to histology.   There was moderate diverticulosis noted in the sigmoid colon with associated tortuosity, muscular hypertrophy and angulation.   Small internal Grade I hemorrhoids were found.  Retroflexed views revealed no abnormalities. The time to cecum = 6.39 Withdrawal time = 9.55 The scope was withdrawn and the procedure completed. COMPLICATIONS: There were no immediate complications.  ENDOSCOPIC IMPRESSION: 1.   Sessile polyp was found in the ascending colon; polypectomy was performed with cold forceps 2.   There was moderate diverticulosis noted in the sigmoid colon 3.   Small internal Grade I hemorrhoids  RECOMMENDATIONS: 1.  Await pathology results 2.  Recall colonoscopy pending path report  eSigned:  Lafayette Dragon, MD 04/06/2015 4:24 PM   cc:   PATIENT NAME:  Mc, Hollen MR#: [id number]

## 2015-04-06 NOTE — Progress Notes (Signed)
Patient awakening, vss

## 2015-04-07 ENCOUNTER — Telehealth: Payer: Self-pay

## 2015-04-07 NOTE — Telephone Encounter (Signed)
Left message on answering machine. 

## 2015-04-08 ENCOUNTER — Other Ambulatory Visit: Payer: Self-pay | Admitting: Internal Medicine

## 2015-04-12 ENCOUNTER — Encounter: Payer: Self-pay | Admitting: Internal Medicine

## 2015-08-29 ENCOUNTER — Encounter: Payer: Self-pay | Admitting: Neurology

## 2015-08-29 ENCOUNTER — Ambulatory Visit (INDEPENDENT_AMBULATORY_CARE_PROVIDER_SITE_OTHER): Payer: Medicare Other | Admitting: Neurology

## 2015-08-29 VITALS — BP 140/81 | HR 95 | Ht 63.0 in | Wt 184.0 lb

## 2015-08-29 DIAGNOSIS — M6289 Other specified disorders of muscle: Secondary | ICD-10-CM | POA: Diagnosis not present

## 2015-08-29 DIAGNOSIS — R531 Weakness: Secondary | ICD-10-CM | POA: Insufficient documentation

## 2015-08-29 NOTE — Progress Notes (Addendum)
PATIENT: [name] DOB: May 18, 1951  Chief Complaint  Patient presents with  . Transient Ischemic Attack    MMSE 29/30 - 16 animals. Reports history of previous TIA.  She is concerned about her left-sided weakness, fatique, headaches and memory loss.     HISTORICAL  [name] is a 64 years old right-handed female, seen in refer by her primary care physician Dr.Janice Theola Sequin in November 20 first 2016 for evaluation of one episode of sudden onset left-sided weakness, difficulty texting  She had a history of hypertension, diabetes since 2012, hyperlipidemia, past history of depression, kidney stone, lumbar decompression surgery in May 2016 by neurosurgeon Dr. Joya Salm, bilateral knee replacements   In July 25 2015, while his she was texting using her phone,, she tends to type the wrong letters, also noticed mild left arm weakness, when she called her aunt, she was noticed to have slurred speech, her wound was a Equities trader, who came by to check on the patient, noticed that she was mildly weak on the left arm and leg, mild slurred speech, initial weakness last about 30 minutes, she felt tired, took a nap,  Since the incident, she continue complains of fatigue,               = His symptoms overall has much improved, but she continue complaining mild gait difficulty, this been present since her previous knee replacement, lumbar decompression surgery,   she also complains of gradual onset mild memory trouble, missing her routine lunch appointment with her family, tends to take the note Laboratory evaluation showed normal B12, TSH,  REVIEW OF SYSTEMS: Full 14 system review of systems performed and notable only for scratch that fatigue, swelling in legs, ringing ears, rash, itching, blurry vision, shortness of breath, anemia, easy bruising, feeling hot, joint pain, cramps, runny nose, skin sensitivity, memory loss, headaches, weakness, dizziness, depression, decreased energy,  disinterested in activities, suicidal thoughts.   ALLERGIES: Allergies  Allergen Reactions  . Formaldehyde     Other reaction(s): Shortness Of Breath  . Ramipril Cough  . Morphine And Related     itching    HOME MEDICATIONS: Current Outpatient Prescriptions  Medication Sig Dispense Refill  . cyclobenzaprine (FLEXERIL) 10 MG tablet Take 10 mg by mouth 3 (three) times daily as needed for muscle spasms.    . diazepam (VALIUM) 5 MG tablet Take 5 mg by mouth every 6 (six) hours as needed for Anxiety.    . diphenhydrAMINE (SOMINEX) 25 MG tablet Take 25 mg by mouth as needed for sleep.    Marland Kitchen estradiol (ESTRACE) 1 MG tablet Take 1 mg by mouth daily.    Marland Kitchen gabapentin (NEURONTIN) 600 MG tablet Take 600 mg by mouth 3 (three) times daily.    Marland Kitchen levothyroxine (SYNTHROID, LEVOTHROID) 25 MCG tablet Take 25 mcg by mouth daily before breakfast.    . meloxicam (MOBIC) 7.5 MG tablet Take 1 tablet (7.5 mg total) by mouth daily.    . metFORMIN (GLUCOPHAGE) 1000 MG tablet Take 1,000 mg by mouth 2 (two) times daily with a meal.    . omeprazole (PRILOSEC) 20 MG capsule Take 1 capsule (20 mg total) by mouth 2 (two) times daily before a meal. 60 capsule 6  . oxyCODONE 20 MG TABS Take 1 tablet (20 mg total) by mouth every 4 (four) hours as needed for moderate pain. 100 tablet 0  . promethazine (PHENERGAN) 25 MG tablet Take 25 mg by mouth every 6 (six) hours  as needed for nausea or vomiting.    . temazepam (RESTORIL) 30 MG capsule TAKE 1 BY MOUTH NIGHTLY AS NEEDED    . valsartan (DIOVAN) 80 MG tablet Take 1 tablet (80 mg total) by mouth daily.     No current facility-administered medications for this visit.    PAST MEDICAL HISTORY: Past Medical History  Diagnosis Date  . Hypothyroidism     takes Synthroid daily  . Insomnia     takes Restoril nightly as needed   . Hypertension     takes Micardis daily  . Diabetes mellitus without complication ([place])     takes Metformin daily  . Muscle spasm     takes  Flexeril daily as needed  . Cataracts, bilateral   . History of kidney stones     has a kidney stone now  . PONV (postoperative nausea and vomiting)   . Heart murmur   . Shortness of breath dyspnea     with exertion but states its bc she can't exercise  . History of bronchitis 2009  . History of migraine     hasn't had one in over a yr  . Numbness and tingling     both toes  . Fibromyalgia     takes Gabapentin daily  . Chronic back pain     scoliosis/displacement of lumbosacral intervertebral disc/stenosis  . History of hiatal hernia   . GERD (gastroesophageal reflux disease)     takes Nexium daily  . Hemorrhoids   . History of colon polyps     benign  . Urinary frequency   . Anemia   . History of blood transfusion 2013    no abnormal reaction noted  . Idiopathic thrombocytopenia ([place])   . Vitamin D deficiency   . Depression     but doens't take any meds  . History of surgery on arm     plates and screws  . Family history of adverse reaction to anesthesia     pts mom would get short of breath after anesthesia but was a smoker  . Fusion of lumbar spine   . Hypertension   . Diabetes ([place])   . Memory loss     PAST SURGICAL HISTORY: Past Surgical History  Procedure Laterality Date  . Abdominal hysterectomy    . Tonsillectomy    . Knee arthroscopy      total of 7 between both knees  . Joint replacement Bilateral   . Cervical fusion    . Kidney stone removed    . Gastric bypass    . Tummy tuck     . Colonoscopy    . Esophagogastroduodenoscopy    . Cardiac catheterization  41yrs ago  . Left arm surgery      pins  . Lumbar fusion      FAMILY HISTORY: Family History  Problem Relation Age of Onset  . Lung cancer Mother   . Lymphoma Paternal Uncle   . Diabetes Maternal Grandmother     x 5 uncles  . Heart disease      Maternal and Paternal  . COPD Mother   . Hypertension Mother   . Depression Mother     SOCIAL HISTORY:  Social History   Social  History  . Marital Status: Single    Spouse Name: N/A  . Number of Children: 1  . Years of Education: 16   Occupational History  . Retired Therapist, sports / Disabled    Social History Main Topics  .  Smoking status: Never Smoker   . Smokeless tobacco: Never Used  . Alcohol Use: No  . Drug Use: No  . Sexual Activity: Not on file   Other Topics Concern  . Not on file   Social History Narrative   Lives at home alone.   Right handed.   Pot of coffee each morning.     PHYSICAL EXAM   Filed Vitals:   08/29/15 1001  BP: 140/81  Pulse: 95  Height: 5\' 3"  (1.6 m)  Weight: 184 lb (83.462 kg)    Not recorded      Body mass index is 32.6 kg/(m^2).  PHYSICAL EXAMNIATION:  Gen: NAD, conversant, well nourised, obese, well groomed                     Cardiovascular: Regular rate rhythm, no peripheral edema, warm, nontender. Eyes: Conjunctivae clear without exudates or hemorrhage Neck: Supple, no carotid bruise. Pulmonary: Clear to auscultation bilaterally   NEUROLOGICAL EXAM:  MENTAL STATUS: Speech:    Speech is normal; fluent and spontaneous with normal comprehension.  Cognition: Mini-Mental Status Examination is 29 out of 30, animal naming is 16,     Orientation to time, place and person     recent and remote memory: She missed one out of 3 recalls      Normal Attention span and concentration     Normal Language, naming, repeating,spontaneous speech     Fund of knowledge   CRANIAL NERVES: CN II: Visual fields are full to confrontation. Fundoscopic exam is normal with sharp discs and no vascular changes. Pupils are round equal and briskly reactive to light. CN III, IV, VI: extraocular movement are normal. No ptosis. CN V: Facial sensation is intact to pinprick in all 3 divisions bilaterally. Corneal responses are intact.  CN VII: Face is symmetric with normal eye closure and smile. CN VIII: Hearing is normal to rubbing fingers CN IX, X: Palate elevates symmetrically. Phonation is  normal. CN XI: Head turning and shoulder shrug are intact CN XII: Tongue is midline with normal movements and no atrophy.  MOTOR: There is no pronator drift of out-stretched arms. Muscle bulk and tone are normal. Muscle strength is normal.  REFLEXES: Reflexes are 2+ and symmetric at the biceps, triceps, knees, and ankles. Plantar responses are flexor.  SENSORY: Intact to light touch, pinprick, position sense, and vibration sense are intact in fingers and toes.  COORDINATION: Rapid alternating movements and fine finger movements are intact. There is no dysmetria on finger-to-nose and heel-knee-shin.    GAIT/STANCE: Posture is normal. Gait is steady with normal steps, base, arm swing, and turning. Heel and toe walking are normal. Tandem gait is normal.  Romberg is absent.   DIAGNOSTIC DATA (LABS, IMAGING, TESTING) - I reviewed patient records, labs, notes, testing and imaging myself where available.   ASSESSMENT AND PLAN  [name] is a 64 y.o. female   with multiple vascular risk factors of hypertension, diabetes, presented with acute onset left-sided weakness, mild slurred speech,   differentiation diagnosis includes right hemisphere small vessel stroke,  Proceed with MRI of the brain Ultrasound of carotid artery Echocardiogram Aspirin daily    Marcial Pacas, M.D. Ph.D.  [facility] [address] [place], [place] [address] Ph: [phone] Fax: [phone]  CC: Referring Provider

## 2015-08-30 ENCOUNTER — Telehealth (HOSPITAL_COMMUNITY): Payer: Self-pay | Admitting: *Deleted

## 2015-09-07 ENCOUNTER — Ambulatory Visit (INDEPENDENT_AMBULATORY_CARE_PROVIDER_SITE_OTHER): Payer: Medicare Other

## 2015-09-07 DIAGNOSIS — M6289 Other specified disorders of muscle: Secondary | ICD-10-CM | POA: Diagnosis not present

## 2015-09-07 DIAGNOSIS — R531 Weakness: Secondary | ICD-10-CM

## 2015-09-12 ENCOUNTER — Other Ambulatory Visit (HOSPITAL_COMMUNITY): Payer: Medicare Other

## 2015-09-14 ENCOUNTER — Telehealth: Payer: Self-pay | Admitting: Neurology

## 2015-09-14 NOTE — Telephone Encounter (Signed)
Please call patient ultrasound of carotid artery showed no significant stenosis

## 2015-09-14 NOTE — Telephone Encounter (Signed)
I called and LMVM for pt on her hm (ok per DPR) that her carotid doppler/ ultrasound was negative for significant stenosis (narrowing).  She is to call back if questions.

## 2015-09-15 ENCOUNTER — Ambulatory Visit (INDEPENDENT_AMBULATORY_CARE_PROVIDER_SITE_OTHER): Payer: Medicare Other

## 2015-09-15 ENCOUNTER — Other Ambulatory Visit: Payer: Self-pay

## 2015-09-15 DIAGNOSIS — M6289 Other specified disorders of muscle: Secondary | ICD-10-CM

## 2015-09-15 DIAGNOSIS — I10 Essential (primary) hypertension: Secondary | ICD-10-CM

## 2015-09-15 DIAGNOSIS — R531 Weakness: Secondary | ICD-10-CM

## 2015-09-16 ENCOUNTER — Ambulatory Visit
Admission: RE | Admit: 2015-09-16 | Discharge: 2015-09-16 | Disposition: A | Discharge status: Home / Self Care | Payer: Medicare Other | Source: Ambulatory Visit | Attending: Neurology | Admitting: Neurology

## 2015-09-16 DIAGNOSIS — R531 Weakness: Secondary | ICD-10-CM

## 2015-09-16 DIAGNOSIS — M6289 Other specified disorders of muscle: Secondary | ICD-10-CM

## 2015-09-20 ENCOUNTER — Telehealth: Payer: Self-pay | Admitting: *Deleted

## 2015-09-20 NOTE — Telephone Encounter (Signed)
LMVM for pt that MRI brain was normal.  (ok to LM home # per [facility]).

## 2015-09-20 NOTE — Telephone Encounter (Signed)
-----   Message from Marcial Pacas, MD sent at 09/19/2015 10:13 AM EST ----- Please call pt for normal MRI brain.

## 2015-09-28 ENCOUNTER — Ambulatory Visit: Payer: Medicare Other | Admitting: Adult Health

## 2015-11-08 ENCOUNTER — Telehealth: Payer: Self-pay | Admitting: *Deleted

## 2015-11-08 ENCOUNTER — Ambulatory Visit: Payer: Medicare Other | Admitting: Neurology

## 2015-11-08 NOTE — Telephone Encounter (Signed)
Canceled appt due to getting flat tire in route to our office.

## 2015-11-09 ENCOUNTER — Encounter: Payer: Self-pay | Admitting: Neurology

## 2015-11-09 ENCOUNTER — Ambulatory Visit (INDEPENDENT_AMBULATORY_CARE_PROVIDER_SITE_OTHER): Payer: Medicare Other | Admitting: Neurology

## 2015-11-09 VITALS — BP 138/69 | HR 98 | Ht 63.0 in | Wt 193.0 lb

## 2015-11-09 DIAGNOSIS — M4316 Spondylolisthesis, lumbar region: Secondary | ICD-10-CM

## 2015-11-09 DIAGNOSIS — R252 Cramp and spasm: Secondary | ICD-10-CM | POA: Diagnosis not present

## 2015-11-09 MED ORDER — OXCARBAZEPINE 150 MG PO TABS: 150.0000 mg | ORAL_TABLET | Freq: Every day | ORAL | Status: DC

## 2015-11-09 NOTE — Progress Notes (Signed)
Chief Complaint  Patient presents with  . Transient Ischemic Attack    She would like to review her MRI, ECHO and carotid US results.  Reports her symptoms have improved.  She is having problems with cramping, especially in her bilateral lower extremities and hands.      PATIENT: [name] DOB: [birth year]  Chief Complaint  Patient presents with  . Transient Ischemic Attack    She would like to review her MRI, ECHO and carotid US results.  Reports her symptoms have improved.  She is having problems with cramping, especially in her bilateral lower extremities and hands.     HISTORICAL  [name] is a 65 years old right-handed female, seen in refer by her primary care physician Dr.Janice Theola Sequin in November 20 first 2016 for evaluation of one episode of sudden onset left-sided weakness, difficulty texting  She had a history of hypertension, diabetes since 2012, hyperlipidemia, past history of depression, kidney stone, lumbar decompression surgery in May 2016 by neurosurgeon Dr. Joya Salm, bilateral knee replacements   In July 25 2015, while his she was texting using her phone,, she noticed she typed the wrong letters, also noticed mild left arm weakness, when she called her aunt, she was noticed to have slurred speech, her aunt was a Equities trader, who came by to check on the patient, noticed that she was mildly weak on the left arm and leg, mild slurred speech, initial weakness last about 30 minutes, she felt tired, took a nap,  Since the incident, she continue complains of fatigue,           Her symptoms overall has much improved, but she continue complaining mild gait difficulty, this been present since her previous knee replacement, lumbar decompression surgery,   she also complains of gradual onset mild memory trouble, missing her routine lunch appointment with her family, tends to take the note Laboratory evaluation showed normal B12, TSH,  UPDATE Nov 09 2015: Her  left side numbness weakness is getting better. She still has right lumbar radiculopathy, radiating pain has improved post lumbar decompression surgery in May 2016, getting worse after bearing weight.  She has frequent bilateral legs, left hand muscle spasm, she does have neck pain, hx of cervical fusion in the past.  She has tried valium, flexeril, without helping her symptoms  We have reviewed MRI of the brain that was normal, MRI of lumbar in 2015, level degenerative disc disease, most severe at L4-5, L3 and 4, with moderate to severe bilateral foraminal stenosis, mild central canal stenosis  Echocardiogram was within normal limit, ultrasound of carotid artery showed no significant abnormality  Laboratory evaluation, normal CMP, glucose 106 mild elevated, creatinine was 1.0, normal CBC, with hemoglobin of 10 point 2, mild decrease, magnesium 1.8 T1 was within normal limits 7, folic acid 8.9 123456 more than 1700, A1c was 5.4, normal thyroid functional task, TSH, cholesterol 166 LDL 23 HDL was 133, normal iron panel  REVIEW OF SYSTEMS: Full 14 system review of systems performed and notable only for fatigue, ringing ears, excessive thirst, excessive eating, frequent wakening sleep talking, anemia, joint pain, back pain, muscle cramps, neck pain, depression, suicidal thoughts.  ALLERGIES: Allergies  Allergen Reactions  . Formaldehyde     Other reaction(s): Shortness Of Breath, coughing  . Altace [Ramipril] Cough  . Morphine And Related     itching    HOME MEDICATIONS: Current Outpatient Prescriptions  Medication Sig Dispense Refill  . cyclobenzaprine (FLEXERIL) 10 MG tablet  Take 10 mg by mouth 3 (three) times daily as needed for muscle spasms.    . diazepam (VALIUM) 5 MG tablet Take 5 mg by mouth every 6 (six) hours as needed for cramping and muscle spasms.    Marland Kitchen estradiol (ESTRACE) 1 MG tablet Take 1 mg by mouth daily.    Marland Kitchen gabapentin (NEURONTIN) 600 MG tablet Take 600 mg by mouth 3 (three)  times daily.    Marland Kitchen levothyroxine (SYNTHROID, LEVOTHROID) 25 MCG tablet Take 25 mcg by mouth daily before breakfast.    . meloxicam (MOBIC) 7.5 MG tablet Take 1 tablet (7.5 mg total) by mouth daily.    . metFORMIN (GLUCOPHAGE) 1000 MG tablet Take 1,000 mg by mouth 2 (two) times daily with a meal.    . omeprazole (PRILOSEC) 20 MG capsule Take 1 capsule (20 mg total) by mouth 2 (two) times daily before a meal. 60 capsule 6  . oxyCODONE 20 MG TABS Take 1 tablet (20 mg total) by mouth every 4 (four) hours as needed for moderate pain. 100 tablet 0  . promethazine (PHENERGAN) 25 MG tablet Take 25 mg by mouth every 6 (six) hours as needed for nausea or vomiting.    . temazepam (RESTORIL) 30 MG capsule TAKE 1 BY MOUTH NIGHTLY AS NEEDED    . valsartan (DIOVAN) 80 MG tablet Take 1 tablet (80 mg total) by mouth daily.     No current facility-administered medications for this visit.    PAST MEDICAL HISTORY: Past Medical History  Diagnosis Date  . Hypothyroidism     takes Synthroid daily  . Insomnia     takes Restoril nightly as needed   . Hypertension     takes Micardis daily  . Diabetes mellitus without complication ([place])     takes Metformin daily  . Muscle spasm     takes Flexeril daily as needed  . Cataracts, bilateral   . History of kidney stones     has a kidney stone now  . PONV (postoperative nausea and vomiting)   . Heart murmur   . Shortness of breath dyspnea     with exertion but states its bc she can't exercise  . History of bronchitis 2009  . History of migraine     hasn't had one in over a yr  . Numbness and tingling     both toes  . Fibromyalgia     takes Gabapentin daily  . Chronic back pain     scoliosis/displacement of lumbosacral intervertebral disc/stenosis  . History of hiatal hernia   . GERD (gastroesophageal reflux disease)     takes Nexium daily  . Hemorrhoids   . History of colon polyps     benign  . Urinary frequency   . Anemia   . History of blood  transfusion 2013    no abnormal reaction noted  . Idiopathic thrombocytopenia ([place])   . Vitamin D deficiency   . Depression     but doens't take any meds  . History of surgery on arm     plates and screws  . Family history of adverse reaction to anesthesia     pts mom would get short of breath after anesthesia but was a smoker  . Fusion of lumbar spine   . Hypertension   . Diabetes ([place])   . Memory loss     PAST SURGICAL HISTORY: Past Surgical History  Procedure Laterality Date  . Abdominal hysterectomy    . Tonsillectomy    .  Knee arthroscopy      total of 7 between both knees  . Joint replacement Bilateral   . Cervical fusion    . Kidney stone removed    . Gastric bypass    . Tummy tuck     . Colonoscopy    . Esophagogastroduodenoscopy    . Cardiac catheterization  105yrs ago  . Left arm surgery      pins  . Lumbar fusion      FAMILY HISTORY: Family History  Problem Relation Age of Onset  . Lung cancer Mother   . Lymphoma Paternal Uncle   . Diabetes Maternal Grandmother     x 5 uncles  . Heart disease      Maternal and Paternal  . COPD Mother   . Hypertension Mother   . Depression Mother     SOCIAL HISTORY:  Social History   Social History  . Marital Status: Single    Spouse Name: N/A  . Number of Children: 1  . Years of Education: 16   Occupational History  . Retired Therapist, sports / Disabled    Social History Main Topics  . Smoking status: Never Smoker   . Smokeless tobacco: Never Used  . Alcohol Use: No  . Drug Use: No  . Sexual Activity: Not on file   Other Topics Concern  . Not on file   Social History Narrative   Lives at home alone.   Right handed.   Pot of coffee each morning.     PHYSICAL EXAM   Filed Vitals:   11/09/15 1134  BP: 138/69  Pulse: 98  Height: 5\' 3"  (1.6 m)  Weight: 193 lb (87.544 kg)    Not recorded      Body mass index is 34.2 kg/(m^2).  PHYSICAL EXAMNIATION:  Gen: NAD, conversant, well nourised, obese, well  groomed                     Cardiovascular: Regular rate rhythm, no peripheral edema, warm, nontender. Eyes: Conjunctivae clear without exudates or hemorrhage Neck: Supple, no carotid bruise. Pulmonary: Clear to auscultation bilaterally   NEUROLOGICAL EXAM:  MENTAL STATUS: Speech:    Speech is normal; fluent and spontaneous with normal comprehension.  Cognition: Mini-Mental Status Examination is 29 out of 30, animal naming is 16,     Orientation to time, place and person     recent and remote memory: She missed one out of 3 recalls      Normal Attention span and concentration     Normal Language, naming, repeating,spontaneous speech     Fund of knowledge   CRANIAL NERVES: CN II: Visual fields are full to confrontation. Fundoscopic exam is normal with sharp discs and no vascular changes. Pupils are round equal and briskly reactive to light. CN III, IV, VI: extraocular movement are normal. No ptosis. CN V: Facial sensation is intact to pinprick in all 3 divisions bilaterally. Corneal responses are intact.  CN VII: Face is symmetric with normal eye closure and smile. CN VIII: Hearing is normal to rubbing fingers CN IX, X: Palate elevates symmetrically. Phonation is normal. CN XI: Head turning and shoulder shrug are intact CN XII: Tongue is midline with normal movements and no atrophy.  MOTOR: There is no pronator drift of out-stretched arms. Muscle bulk and tone are normal. Muscle strength is normal.  REFLEXES: Reflexes are 1 and symmetric at the biceps, triceps, knees, and ankles. Plantar responses are flexor.  SENSORY: Mildly dense dependent  light touch, pinprick,  and vibration sensation at toes   COORDINATION: Rapid alternating movements and fine finger movements are intact. There is no dysmetria on finger-to-nose and heel-knee-shin.    GAIT/STANCE: Posture is normal. Gait is steady with normal steps, base, arm swing, and turning. Heel and toe walking are normal. Tandem gait  is normal.  Romberg is absent.   DIAGNOSTIC DATA (LABS, IMAGING, TESTING) - I reviewed patient records, labs, notes, testing and imaging myself where available.   ASSESSMENT AND PLAN  [name] is a 65 y.o. female  Lumbar radiculopathy frequent bilateral lower extremity muscle cramping  EMG nerve conduction study  Keep gabapentin 600 mg 3 times a day  Add on Trileptal 150 mg half to 1 tablet every night  Continue moderate exercise    Marcial Pacas, M.D. Ph.D.  [facility] [address], [place], [place] [address] Ph: [phone] Fax: [phone]  CC: Referring Provider

## 2015-12-08 ENCOUNTER — Telehealth: Payer: Self-pay | Admitting: *Deleted

## 2015-12-08 NOTE — Telephone Encounter (Signed)
Release faxed to Dr Joya Salm requesting NCV/EMG report.

## 2015-12-15 ENCOUNTER — Ambulatory Visit (INDEPENDENT_AMBULATORY_CARE_PROVIDER_SITE_OTHER): Payer: Self-pay | Admitting: Neurology

## 2015-12-15 ENCOUNTER — Ambulatory Visit (INDEPENDENT_AMBULATORY_CARE_PROVIDER_SITE_OTHER): Payer: Medicare Other | Admitting: Neurology

## 2015-12-15 DIAGNOSIS — R252 Cramp and spasm: Secondary | ICD-10-CM

## 2015-12-15 DIAGNOSIS — M4316 Spondylolisthesis, lumbar region: Secondary | ICD-10-CM

## 2015-12-15 DIAGNOSIS — M6289 Other specified disorders of muscle: Secondary | ICD-10-CM

## 2015-12-15 DIAGNOSIS — Z0289 Encounter for other administrative examinations: Secondary | ICD-10-CM

## 2015-12-15 DIAGNOSIS — R531 Weakness: Secondary | ICD-10-CM

## 2015-12-15 NOTE — Progress Notes (Signed)
EMG nerve conduction study today, see report under procedure note

## 2015-12-15 NOTE — Procedures (Signed)
   NCS (NERVE CONDUCTION STUDY) WITH EMG (ELECTROMYOGRAPHY) REPORT   STUDY DATE: December 15 2015 PATIENT NAME: [name] DOB: [birth year] MRN: [id number]    TECHNOLOGIST: Laretta Alstrom ELECTROMYOGRAPHER: Marcial Pacas M.D.  CLINICAL INFORMATION:  65 year old female with history of lumbar decompression surgery, complains of bilateral lower extremity and upper extremity paresthesia, right worse than left  FINDINGS: NERVE CONDUCTION STUDY: Bilateral peroneal sensory responses were normal. Bilateral peroneal to EDB and tibial motor responses were normal. Bilateral tibial H reflexes were normal and symmetric. Right median ulnar sensory and motor responses were normal.  NEEDLE ELECTROMYOGRAPHY: Selective needle examinations was performed at right upper, bilateral lower extremity muscles, right cervical paraspinal muscles and bilateral lumbar sacral paraspinal muscles.  Needle examination of right tibialis anterior, tibialis posterior, medial gastrocnemius, vastus lateralis showed no significant abnormality Left tibialis anterior, vastus lateralis: Increased insertional activity, no spontaneous activity, mildly enlarged motor unit potential, with mildly decreased recruitment patterns.  Left medial gastrocnemius, normal insertion activity, no spontaneous activity, normal morphology motor unit potential, with normal recruitment patterns.  There was well-healed midline lumbar scar, increased insertional activity along bilateral lumbar sacral paraspinal muscles, there was active denervation's at left L5-S1,  Needle examination of left pronator teres, first dorsal interossei, biceps, triceps, deltoid was normal.  There was no spontaneous activity at left cervical paraspinal muscles, left C5-6 and 7.  IMPRESSION:   This is an abnormal study, there is electrodiagnostic evidence of mild chronic left lumbar radiculopathy mainly involving left L4-5 myotomes. There is no evidence of right upper  extremity neuropathy or right cervical radiculopathy.   INTERPRETING PHYSICIAN:   Marcial Pacas M.D. Ph.D. [facility] [address] [place], [place] [address] [phone]

## 2015-12-19 ENCOUNTER — Telehealth: Payer: Self-pay | Admitting: Neurology

## 2015-12-19 MED ORDER — OXCARBAZEPINE 150 MG PO TABS: ORAL_TABLET | ORAL | Status: DC

## 2015-12-19 NOTE — Telephone Encounter (Signed)
Pt needs a refill on OXcarbazepine (TRILEPTAL) 150 MG tablet, she is asking that the new rx state she may have up to 4 pillls a day and to have the # of tablets reflected as such. If she has only 30 tablets she will run out before she is allowed to have another refill. May call pt [phone]

## 2015-12-19 NOTE — Telephone Encounter (Signed)
Ok per Dr. Krista Blue, to send in for four times daily.  Patient aware.

## 2016-01-19 ENCOUNTER — Telehealth: Payer: Self-pay | Admitting: Neurology

## 2016-01-19 NOTE — Telephone Encounter (Signed)
Spoke to patient - states Dr. Krista Blue discussed the results with her during her test - she thought our office was going to call her to schedule a follow up appt - she would like to do that today - appt scheduled.

## 2016-01-19 NOTE — Telephone Encounter (Signed)
Patient called to advise she had NCS/EMG 12/15/15 and no one has called her with results, also states she has no follow up appointment. 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 Please call [phone].

## 2016-02-16 ENCOUNTER — Encounter: Payer: Self-pay | Admitting: Neurology

## 2016-02-16 ENCOUNTER — Ambulatory Visit (INDEPENDENT_AMBULATORY_CARE_PROVIDER_SITE_OTHER): Payer: Medicare Other | Admitting: Neurology

## 2016-02-16 VITALS — BP 165/73 | HR 108 | Ht 63.0 in | Wt 189.5 lb

## 2016-02-16 DIAGNOSIS — R531 Weakness: Secondary | ICD-10-CM

## 2016-02-16 DIAGNOSIS — R252 Cramp and spasm: Secondary | ICD-10-CM | POA: Diagnosis not present

## 2016-02-16 DIAGNOSIS — M542 Cervicalgia: Secondary | ICD-10-CM | POA: Diagnosis not present

## 2016-02-16 DIAGNOSIS — M4316 Spondylolisthesis, lumbar region: Secondary | ICD-10-CM

## 2016-02-16 DIAGNOSIS — M6289 Other specified disorders of muscle: Secondary | ICD-10-CM | POA: Diagnosis not present

## 2016-02-16 NOTE — Progress Notes (Signed)
Chief Complaint  Patient presents with  . Muscle Cramps    She would like to review NCV/EMG results.        PATIENT: [name] DOB: Nov 04, 1950  Chief Complaint  Patient presents with  . Muscle Cramps    She would like to review NCV/EMG results.       HISTORICAL  [name] is a 65 years old right-handed female, seen in refer by her primary care physician Dr.Janice Theola Sequin in November 20 first 2016 for evaluation of one episode of sudden onset left-sided weakness, difficulty texting  She had a history of hypertension, diabetes since 2012, hyperlipidemia, past history of depression, kidney stone, lumbar decompression surgery in May 2016 by neurosurgeon Dr. Joya Salm, bilateral knee replacements, She had a cyst history of cervical decompression surgery in 2009, by Dr. Joya Salm, prior to surgery, she had neck pain, shooting pain to her spine,   In July 25 2015, while his she was texting using her phone,, she noticed she typed the wrong letters, also noticed mild left arm weakness, when she called her aunt, she was noticed to have slurred speech, her aunt was a Equities trader, who came by to check on the patient, noticed that she was mildly weak on the left arm and leg, mild slurred speech, initial weakness last about 30 minutes, she felt tired, took a nap,  Since the incident, she continue complains of fatigue,           Her symptoms overall has much improved, but she continue complaining mild gait difficulty, this been present since her previous knee replacement, lumbar decompression surgery,  She also complains of gradual onset mild memory trouble, missing her routine lunch appointment with her family, tends to take the note Laboratory evaluation showed normal B12, TSH,  UPDATE Nov 09 2015: Her left side numbness weakness is getting better. She still has right lumbar radiculopathy, radiating pain has improved post lumbar decompression surgery in May 2016, getting worse  after bearing weight.  She has frequent bilateral legs, left hand muscle spasm, she does have neck pain, hx of cervical fusion in the past.  She has tried valium, flexeril, without helping her symptoms  We have reviewed MRI of the brain in December 2016 that was normal, MRI of lumbar in 2015 prior to lumbar decompression surgery in May 2016, level degenerative disc disease, most severe at L4-5, L3 and 4, with moderate to severe bilateral foraminal stenosis, mild central canal stenosis  Echocardiogram was within normal limit, ultrasound of carotid artery showed no significant abnormality  Laboratory evaluation, normal CMP, glucose 106 mild elevated, creatinine was 1.0, normal CBC, with hemoglobin of 10 point 2, mild decrease, magnesium 1.8 T1 was within normal limits 7, folic acid 8.9 [id number] more than 1700, A1c was 5.4, normal thyroid functional task, TSH, cholesterol 166 LDL 23 HDL was 133, normal iron panel  Update Feb 16 2016: EMG nerve conduction study March 2017: Showed evidence of mild chronic left lumbar radiculopathy mainly involving left L4-5 myotomes. There is no evidence of right upper extremity neuropathy or right cervical radiculopathy.  She complains of chronic low back pain neck pain, radiating pain to bilateral upper extremity, had a history of cervical decompression surgery in 2009 by Dr. Joya Salm after heavy lifting, radiating pain to her spine sounds like cervical myelopathy  Trileptal 150 mg every night has helped her muscle cramping   REVIEW OF SYSTEMS: Full 14 system review of systems performed and notable only for fatigue, ringing  ears, excessive thirst, excessive eating, frequent wakening sleep talking, anemia, joint pain, back pain, muscle cramps, neck pain, depression,  ALLERGIES: Allergies  Allergen Reactions  . Formaldehyde     Other reaction(s): Shortness Of Breath, coughing  . Altace [Ramipril] Cough  . Morphine And Related     itching    HOME  MEDICATIONS: Current Outpatient Prescriptions  Medication Sig Dispense Refill  . cyclobenzaprine (FLEXERIL) 10 MG tablet Take 10 mg by mouth 3 (three) times daily as needed for muscle spasms.    Marland Kitchen estradiol (ESTRACE) 1 MG tablet Take 1 mg by mouth daily.    Marland Kitchen gabapentin (NEURONTIN) 600 MG tablet Take 600 mg by mouth 3 (three) times daily.    Marland Kitchen levothyroxine (SYNTHROID, LEVOTHROID) 25 MCG tablet Take 25 mcg by mouth daily before breakfast.    . meloxicam (MOBIC) 7.5 MG tablet Take 1 tablet (7.5 mg total) by mouth daily.    . metFORMIN (GLUCOPHAGE) 1000 MG tablet Take 1,000 mg by mouth 2 (two) times daily with a meal.    . omeprazole (PRILOSEC) 20 MG capsule Take 1 capsule (20 mg total) by mouth 2 (two) times daily before a meal. 60 capsule 6  . OXcarbazepine (TRILEPTAL) 150 MG tablet Take one tablet four times daily. 120 tablet 11  . oxyCODONE 20 MG TABS Take 1 tablet (20 mg total) by mouth every 4 (four) hours as needed for moderate pain. 100 tablet 0  . permethrin (ELIMITE) 5 % cream Apply topically once a week.     . predniSONE (DELTASONE) 20 MG tablet Take 40mg  x 14 days.    . promethazine (PHENERGAN) 25 MG tablet Take 25 mg by mouth every 6 (six) hours as needed for nausea or vomiting.    . temazepam (RESTORIL) 30 MG capsule TAKE 1 BY MOUTH NIGHTLY AS NEEDED    . triamcinolone cream (KENALOG) 0.1 % APPLY TOPICALLY AA BID PRN  3  . valsartan (DIOVAN) 80 MG tablet Take 1 tablet (80 mg total) by mouth daily.     No current facility-administered medications for this visit.    PAST MEDICAL HISTORY: Past Medical History  Diagnosis Date  . Hypothyroidism     takes Synthroid daily  . Insomnia     takes Restoril nightly as needed   . Hypertension     takes Micardis daily  . Diabetes mellitus without complication ([place])     takes Metformin daily  . Muscle spasm     takes Flexeril daily as needed  . Cataracts, bilateral   . History of kidney stones     has a kidney stone now  . PONV  (postoperative nausea and vomiting)   . Heart murmur   . Shortness of breath dyspnea     with exertion but states its bc she can't exercise  . History of bronchitis 2009  . History of migraine     hasn't had one in over a yr  . Numbness and tingling     both toes  . Fibromyalgia     takes Gabapentin daily  . Chronic back pain     scoliosis/displacement of lumbosacral intervertebral disc/stenosis  . History of hiatal hernia   . GERD (gastroesophageal reflux disease)     takes Nexium daily  . Hemorrhoids   . History of colon polyps     benign  . Urinary frequency   . Anemia   . History of blood transfusion 2013    no abnormal reaction noted  . Idiopathic  thrombocytopenia ([place])   . Vitamin D deficiency   . Depression     but doens't take any meds  . History of surgery on arm     plates and screws  . Family history of adverse reaction to anesthesia     pts mom would get short of breath after anesthesia but was a smoker  . Fusion of lumbar spine   . Hypertension   . Diabetes ([place])   . Memory loss     PAST SURGICAL HISTORY: Past Surgical History  Procedure Laterality Date  . Abdominal hysterectomy    . Tonsillectomy    . Knee arthroscopy      total of 7 between both knees  . Joint replacement Bilateral   . Cervical fusion    . Kidney stone removed    . Gastric bypass    . Tummy tuck     . Colonoscopy    . Esophagogastroduodenoscopy    . Cardiac catheterization  28yrs ago  . Left arm surgery      pins  . Lumbar fusion      FAMILY HISTORY: Family History  Problem Relation Age of Onset  . Lung cancer Mother   . Lymphoma Paternal Uncle   . Diabetes Maternal Grandmother     x 5 uncles  . Heart disease      Maternal and Paternal  . COPD Mother   . Hypertension Mother   . Depression Mother     SOCIAL HISTORY:  Social History   Social History  . Marital Status: Single    Spouse Name: N/A  . Number of Children: 1  . Years of Education: 16    Occupational History  . Retired Therapist, sports / Disabled    Social History Main Topics  . Smoking status: Never Smoker   . Smokeless tobacco: Never Used  . Alcohol Use: No  . Drug Use: No  . Sexual Activity: Not on file   Other Topics Concern  . Not on file   Social History Narrative   Lives at home alone.   Right handed.   Pot of coffee each morning.     PHYSICAL EXAM   Filed Vitals:   02/16/16 1529  BP: 165/73  Pulse: 108  Height: 5\' 3"  (1.6 m)  Weight: 189 lb 8 oz (85.957 kg)    Not recorded      Body mass index is 33.58 kg/(m^2).  PHYSICAL EXAMNIATION:  Gen: NAD, conversant, well nourised, obese, well groomed                     Cardiovascular: Regular rate rhythm, no peripheral edema, warm, nontender. Eyes: Conjunctivae clear without exudates or hemorrhage Neck: Supple, no carotid bruise. Pulmonary: Clear to auscultation bilaterally   NEUROLOGICAL EXAM:  MENTAL STATUS: Speech:    Speech is normal; fluent and spontaneous with normal comprehension.  Cognition: Mini-Mental Status Examination is 29 out of 30, animal naming is 16,     Orientation to time, place and person     recent and remote memory: She missed one out of 3 recalls      Normal Attention span and concentration     Normal Language, naming, repeating,spontaneous speech     Fund of knowledge   CRANIAL NERVES: CN II: Visual fields are full to confrontation. Fundoscopic exam is normal with sharp discs and no vascular changes. Pupils are round equal and briskly reactive to light. CN III, IV, VI: extraocular movement are normal. No  ptosis. CN V: Facial sensation is intact to pinprick in all 3 divisions bilaterally. Corneal responses are intact.  CN VII: Face is symmetric with normal eye closure and smile. CN VIII: Hearing is normal to rubbing fingers CN IX, X: Palate elevates symmetrically. Phonation is normal. CN XI: Head turning and shoulder shrug are intact CN XII: Tongue is midline with normal  movements and no atrophy.  MOTOR: There is no pronator drift of out-stretched arms. Muscle bulk and tone are normal. Muscle strength is normal.  REFLEXES: Reflexes are 1 and symmetric at the biceps, triceps, knees, and ankles. Plantar responses are flexor.  SENSORY: Mildly dense dependent light touch, pinprick,  and vibration sensation at toes   COORDINATION: Rapid alternating movements and fine finger movements are intact. There is no dysmetria on finger-to-nose and heel-knee-shin.    GAIT/STANCE: Posture is normal. Gait is steady with normal steps, base, arm swing, and turning. Mild difficulty with heel, tandem and tiptoe walking  Romberg is absent.   DIAGNOSTIC DATA (LABS, IMAGING, TESTING) - I reviewed patient records, labs, notes, testing and imaging myself where available.   ASSESSMENT AND PLAN  LOUETTA ZIESKE is a 65 y.o. female  Lumbar radiculopathy frequent bilateral lower extremity muscle cramping  EMG nerve conduction study  Keep gabapentin 600 mg 3 times a day  Keep Trileptal 150 mg half to 1 tablet every night  Continue moderate exercise  Chronic neck pain, history of cervical decompression surgery, intermittent bilateral hands paresthesia,  No evidence of obvious bilateral carpal tunnel syndrome  Repeat MRI of the cervical spine to rule out cervical radiculopathy   Marcial Pacas, M.D. Ph.D.  [facility] [address], [place],  [address] Ph: [phone] Fax: [phone]  CC: Referring Provider

## 2016-02-29 ENCOUNTER — Ambulatory Visit (INDEPENDENT_AMBULATORY_CARE_PROVIDER_SITE_OTHER): Payer: Self-pay

## 2016-02-29 DIAGNOSIS — R531 Weakness: Secondary | ICD-10-CM

## 2016-02-29 DIAGNOSIS — M542 Cervicalgia: Secondary | ICD-10-CM

## 2016-02-29 DIAGNOSIS — R252 Cramp and spasm: Secondary | ICD-10-CM

## 2016-02-29 DIAGNOSIS — Z0289 Encounter for other administrative examinations: Secondary | ICD-10-CM

## 2016-02-29 DIAGNOSIS — M4316 Spondylolisthesis, lumbar region: Secondary | ICD-10-CM

## 2016-05-01 ENCOUNTER — Telehealth: Payer: Self-pay | Admitting: Neurology

## 2016-05-01 NOTE — Telephone Encounter (Signed)
She is concerned about her worsening gait, increased falls and tremors.  She is using a walker to assist w/ ambulation.  She is requesting to be worked-in earlier for further evaluation.  Placed on Carolyn's schedule for 05/07/16.

## 2016-05-01 NOTE — Telephone Encounter (Signed)
Patient called to advise of problems walking then all of a sudden , she's on the ground, states this usually happens inside her home, this time it happened outside and strangers had to help her up off the sidewalk, states she has talked to Dr. Krista Blue about this before. Patient also states, tremors are worse. Patient requests to move Monday August 21st appointment with Dr. Krista Blue up a lot sooner.

## 2016-05-03 ENCOUNTER — Encounter (HOSPITAL_COMMUNITY): Payer: Self-pay | Admitting: *Deleted

## 2016-05-03 ENCOUNTER — Emergency Department (HOSPITAL_COMMUNITY): Payer: Medicare Other

## 2016-05-03 ENCOUNTER — Inpatient Hospital Stay (HOSPITAL_COMMUNITY)
Admission: EM | Admit: 2016-05-03 | Discharge: 2016-05-07 | DRG: 682 | Disposition: A | Discharge status: Skilled Nursing Facility | Payer: Medicare Other | Attending: Internal Medicine | Admitting: Internal Medicine

## 2016-05-03 DIAGNOSIS — R531 Weakness: Secondary | ICD-10-CM | POA: Diagnosis present

## 2016-05-03 DIAGNOSIS — N19 Unspecified kidney failure: Secondary | ICD-10-CM

## 2016-05-03 DIAGNOSIS — W19XXXA Unspecified fall, initial encounter: Secondary | ICD-10-CM | POA: Diagnosis not present

## 2016-05-03 DIAGNOSIS — E2740 Unspecified adrenocortical insufficiency: Secondary | ICD-10-CM | POA: Diagnosis present

## 2016-05-03 DIAGNOSIS — E1169 Type 2 diabetes mellitus with other specified complication: Secondary | ICD-10-CM

## 2016-05-03 DIAGNOSIS — G4700 Insomnia, unspecified: Secondary | ICD-10-CM | POA: Diagnosis present

## 2016-05-03 DIAGNOSIS — M5415 Radiculopathy, thoracolumbar region: Secondary | ICD-10-CM

## 2016-05-03 DIAGNOSIS — M6282 Rhabdomyolysis: Secondary | ICD-10-CM | POA: Diagnosis present

## 2016-05-03 DIAGNOSIS — E43 Unspecified severe protein-calorie malnutrition: Secondary | ICD-10-CM | POA: Diagnosis present

## 2016-05-03 DIAGNOSIS — H269 Unspecified cataract: Secondary | ICD-10-CM | POA: Diagnosis present

## 2016-05-03 DIAGNOSIS — N170 Acute kidney failure with tubular necrosis: Secondary | ICD-10-CM | POA: Diagnosis not present

## 2016-05-03 DIAGNOSIS — E669 Obesity, unspecified: Secondary | ICD-10-CM

## 2016-05-03 DIAGNOSIS — M545 Low back pain, unspecified: Secondary | ICD-10-CM

## 2016-05-03 DIAGNOSIS — R339 Retention of urine, unspecified: Secondary | ICD-10-CM | POA: Diagnosis present

## 2016-05-03 DIAGNOSIS — E871 Hypo-osmolality and hyponatremia: Secondary | ICD-10-CM | POA: Diagnosis present

## 2016-05-03 DIAGNOSIS — K219 Gastro-esophageal reflux disease without esophagitis: Secondary | ICD-10-CM | POA: Diagnosis present

## 2016-05-03 DIAGNOSIS — Z6833 Body mass index (BMI) 33.0-33.9, adult: Secondary | ICD-10-CM | POA: Diagnosis not present

## 2016-05-03 DIAGNOSIS — Z981 Arthrodesis status: Secondary | ICD-10-CM | POA: Diagnosis not present

## 2016-05-03 DIAGNOSIS — I10 Essential (primary) hypertension: Secondary | ICD-10-CM | POA: Diagnosis present

## 2016-05-03 DIAGNOSIS — D619 Aplastic anemia, unspecified: Secondary | ICD-10-CM

## 2016-05-03 DIAGNOSIS — D649 Anemia, unspecified: Secondary | ICD-10-CM | POA: Diagnosis present

## 2016-05-03 DIAGNOSIS — N179 Acute kidney failure, unspecified: Principal | ICD-10-CM

## 2016-05-03 DIAGNOSIS — E860 Dehydration: Secondary | ICD-10-CM

## 2016-05-03 DIAGNOSIS — R251 Tremor, unspecified: Secondary | ICD-10-CM | POA: Diagnosis not present

## 2016-05-03 DIAGNOSIS — W19XXXD Unspecified fall, subsequent encounter: Secondary | ICD-10-CM | POA: Diagnosis not present

## 2016-05-03 DIAGNOSIS — M4316 Spondylolisthesis, lumbar region: Secondary | ICD-10-CM | POA: Diagnosis present

## 2016-05-03 DIAGNOSIS — E119 Type 2 diabetes mellitus without complications: Secondary | ICD-10-CM | POA: Diagnosis present

## 2016-05-03 DIAGNOSIS — R296 Repeated falls: Secondary | ICD-10-CM | POA: Diagnosis present

## 2016-05-03 DIAGNOSIS — M797 Fibromyalgia: Secondary | ICD-10-CM | POA: Diagnosis present

## 2016-05-03 DIAGNOSIS — Z79899 Other long term (current) drug therapy: Secondary | ICD-10-CM | POA: Diagnosis not present

## 2016-05-03 DIAGNOSIS — E875 Hyperkalemia: Secondary | ICD-10-CM

## 2016-05-03 DIAGNOSIS — G8929 Other chronic pain: Secondary | ICD-10-CM

## 2016-05-03 DIAGNOSIS — T796XXD Traumatic ischemia of muscle, subsequent encounter: Secondary | ICD-10-CM | POA: Diagnosis not present

## 2016-05-03 DIAGNOSIS — F329 Major depressive disorder, single episode, unspecified: Secondary | ICD-10-CM | POA: Diagnosis present

## 2016-05-03 DIAGNOSIS — E039 Hypothyroidism, unspecified: Secondary | ICD-10-CM | POA: Diagnosis present

## 2016-05-03 DIAGNOSIS — Z7984 Long term (current) use of oral hypoglycemic drugs: Secondary | ICD-10-CM

## 2016-05-03 DIAGNOSIS — M961 Postlaminectomy syndrome, not elsewhere classified: Secondary | ICD-10-CM

## 2016-05-03 LAB — RETICULOCYTES
RBC.: 3.44 MIL/uL — ABNORMAL LOW (ref 3.87–5.11)
Retic Count, Absolute: 58.5 10*3/uL (ref 19.0–186.0)
Retic Ct Pct: 1.7 % (ref 0.4–3.1)

## 2016-05-03 LAB — FERRITIN: FERRITIN: 48 ng/mL (ref 11–307)

## 2016-05-03 LAB — CBC
HCT: 35.9 % — ABNORMAL LOW (ref 36.0–46.0)
Hemoglobin: 11.5 g/dL — ABNORMAL LOW (ref 12.0–15.0)
MCH: 29.4 pg (ref 26.0–34.0)
MCHC: 32 g/dL (ref 30.0–36.0)
MCV: 91.8 fL (ref 78.0–100.0)
PLATELETS: 240 10*3/uL (ref 150–400)
RBC: 3.91 MIL/uL (ref 3.87–5.11)
RDW: 15.6 % — AB (ref 11.5–15.5)
WBC: 10.1 10*3/uL (ref 4.0–10.5)

## 2016-05-03 LAB — COMPREHENSIVE METABOLIC PANEL
ALK PHOS: 109 U/L (ref 38–126)
ALT: 31 U/L (ref 14–54)
AST: 29 U/L (ref 15–41)
Albumin: 2.5 g/dL — ABNORMAL LOW (ref 3.5–5.0)
Anion gap: 10 (ref 5–15)
BUN: 38 mg/dL — AB (ref 6–20)
CALCIUM: 8.3 mg/dL — AB (ref 8.9–10.3)
CHLORIDE: 101 mmol/L (ref 101–111)
CO2: 18 mmol/L — AB (ref 22–32)
CREATININE: 2.78 mg/dL — AB (ref 0.44–1.00)
GFR calc non Af Amer: 17 mL/min — ABNORMAL LOW (ref >60)
GFR, EST AFRICAN AMERICAN: 19 mL/min — AB (ref >60)
Glucose, Bld: 146 mg/dL — ABNORMAL HIGH (ref 65–99)
Potassium: 6 mmol/L — ABNORMAL HIGH (ref 3.5–5.1)
SODIUM: 129 mmol/L — AB (ref 135–145)
Total Bilirubin: 0.5 mg/dL (ref 0.3–1.2)
Total Protein: 4.9 g/dL — ABNORMAL LOW (ref 6.5–8.1)

## 2016-05-03 LAB — URINALYSIS, ROUTINE W REFLEX MICROSCOPIC
Glucose, UA: NEGATIVE mg/dL
HGB URINE DIPSTICK: NEGATIVE
Ketones, ur: 15 mg/dL — AB
Leukocytes, UA: NEGATIVE
Nitrite: NEGATIVE
PROTEIN: NEGATIVE mg/dL
Specific Gravity, Urine: 1.02 (ref 1.005–1.030)
pH: 5 (ref 5.0–8.0)

## 2016-05-03 LAB — OSMOLALITY: Osmolality: 296 mOsm/kg — ABNORMAL HIGH (ref 275–295)

## 2016-05-03 LAB — VITAMIN B12: Vitamin B-12: 709 pg/mL (ref 180–914)

## 2016-05-03 LAB — PHOSPHORUS: Phosphorus: 5 mg/dL — ABNORMAL HIGH (ref 2.5–4.6)

## 2016-05-03 LAB — FOLATE: Folate: 27.7 ng/mL (ref >5.9)

## 2016-05-03 LAB — MAGNESIUM: MAGNESIUM: 1.7 mg/dL (ref 1.7–2.4)

## 2016-05-03 LAB — LIPASE, BLOOD: Lipase: 11 U/L (ref 11–51)

## 2016-05-03 LAB — POTASSIUM: Potassium: 6.8 mmol/L (ref 3.5–5.1)

## 2016-05-03 LAB — IRON AND TIBC
IRON: 21 ug/dL — AB (ref 28–170)
SATURATION RATIOS: 11 % (ref 10.4–31.8)
TIBC: 195 ug/dL — AB (ref 250–450)
UIBC: 174 ug/dL

## 2016-05-03 LAB — CBG MONITORING, ED: Glucose-Capillary: 171 mg/dL — ABNORMAL HIGH (ref 65–99)

## 2016-05-03 LAB — GLUCOSE, CAPILLARY: Glucose-Capillary: 100 mg/dL — ABNORMAL HIGH (ref 65–99)

## 2016-05-03 LAB — TSH: TSH: 2.344 u[IU]/mL (ref 0.350–4.500)

## 2016-05-03 LAB — NA AND K (SODIUM & POTASSIUM), RAND UR
POTASSIUM UR: 49 mmol/L
Sodium, Ur: 12 mmol/L

## 2016-05-03 LAB — CK: CK TOTAL: 545 U/L — AB (ref 38–234)

## 2016-05-03 LAB — OSMOLALITY, URINE: Osmolality, Ur: 414 mOsm/kg (ref 300–900)

## 2016-05-03 MED ORDER — ESTRADIOL 1 MG PO TABS
1.0000 mg | ORAL_TABLET | Freq: Every day | ORAL | Status: DC
  Administered 2016-05-03 – 2016-05-07 (×5): 1 mg via ORAL
  Filled 2016-05-03 (×5): qty 1

## 2016-05-03 MED ORDER — SODIUM CHLORIDE 0.9% FLUSH
3.0000 mL | Freq: Two times a day (BID) | INTRAVENOUS | Status: DC
  Administered 2016-05-03 – 2016-05-07 (×6): 3 mL via INTRAVENOUS

## 2016-05-03 MED ORDER — ALBUTEROL SULFATE (2.5 MG/3ML) 0.083% IN NEBU
5.0000 mg | INHALATION_SOLUTION | Freq: Once | RESPIRATORY_TRACT | Status: AC
  Administered 2016-05-03: 5 mg via RESPIRATORY_TRACT
  Filled 2016-05-03: qty 6

## 2016-05-03 MED ORDER — CALCIUM GLUCONATE 10 % IV SOLN
1.0000 g | Freq: Once | INTRAVENOUS | Status: AC
  Administered 2016-05-03: 1 g via INTRAVENOUS
  Filled 2016-05-03: qty 10

## 2016-05-03 MED ORDER — SODIUM CHLORIDE 0.9 % IV BOLUS (SEPSIS)
1000.0000 mL | Freq: Once | INTRAVENOUS | Status: AC
  Administered 2016-05-03: 1000 mL via INTRAVENOUS

## 2016-05-03 MED ORDER — DEXTROSE 50 % IV SOLN
1.0000 | Freq: Once | INTRAVENOUS | Status: AC
  Administered 2016-05-03: 50 mL via INTRAVENOUS
  Filled 2016-05-03: qty 50

## 2016-05-03 MED ORDER — ONDANSETRON HCL 4 MG PO TABS: 4.0000 mg | ORAL_TABLET | Freq: Four times a day (QID) | ORAL | Status: DC | PRN

## 2016-05-03 MED ORDER — SODIUM POLYSTYRENE SULFONATE 15 GM/60ML PO SUSP
30.0000 g | Freq: Once | ORAL | Status: AC
  Administered 2016-05-03: 30 g via ORAL
  Filled 2016-05-03: qty 120

## 2016-05-03 MED ORDER — PANTOPRAZOLE SODIUM 40 MG PO TBEC
40.0000 mg | DELAYED_RELEASE_TABLET | Freq: Every day | ORAL | Status: DC
  Administered 2016-05-03 – 2016-05-07 (×5): 40 mg via ORAL
  Filled 2016-05-03 (×5): qty 1

## 2016-05-03 MED ORDER — SODIUM CHLORIDE 0.9 % IV SOLN
INTRAVENOUS | Status: AC
  Administered 2016-05-03 – 2016-05-05 (×4): via INTRAVENOUS

## 2016-05-03 MED ORDER — TRIAMCINOLONE ACETONIDE 55 MCG/ACT NA AERO
2.0000 | INHALATION_SPRAY | Freq: Every day | NASAL | Status: DC | PRN
  Filled 2016-05-03: qty 21.6

## 2016-05-03 MED ORDER — TEMAZEPAM 15 MG PO CAPS
30.0000 mg | ORAL_CAPSULE | Freq: Every evening | ORAL | Status: DC | PRN
  Administered 2016-05-03: 30 mg via ORAL
  Filled 2016-05-03 (×2): qty 2

## 2016-05-03 MED ORDER — INSULIN ASPART 100 UNIT/ML ~~LOC~~ SOLN
0.0000 [IU] | Freq: Three times a day (TID) | SUBCUTANEOUS | Status: DC
  Administered 2016-05-04: 2 [IU] via SUBCUTANEOUS

## 2016-05-03 MED ORDER — OXYCODONE HCL 5 MG PO TABS
20.0000 mg | ORAL_TABLET | Freq: Three times a day (TID) | ORAL | Status: DC | PRN
  Administered 2016-05-03 – 2016-05-07 (×7): 20 mg via ORAL
  Filled 2016-05-03 (×8): qty 4

## 2016-05-03 MED ORDER — GABAPENTIN 600 MG PO TABS
600.0000 mg | ORAL_TABLET | Freq: Three times a day (TID) | ORAL | Status: DC
  Administered 2016-05-03 – 2016-05-04 (×2): 600 mg via ORAL
  Filled 2016-05-03 (×2): qty 1

## 2016-05-03 MED ORDER — LORAZEPAM 2 MG/ML IJ SOLN: 1.0000 mg | Freq: Once | INTRAMUSCULAR | Status: DC

## 2016-05-03 MED ORDER — PROMETHAZINE HCL 25 MG PO TABS: 25.0000 mg | ORAL_TABLET | Freq: Four times a day (QID) | ORAL | Status: DC | PRN

## 2016-05-03 MED ORDER — SODIUM CHLORIDE 0.9 % IV BOLUS (SEPSIS): 1000.0000 mL | Freq: Once | INTRAVENOUS | Status: DC

## 2016-05-03 MED ORDER — CYCLOBENZAPRINE HCL 10 MG PO TABS: 10.0000 mg | ORAL_TABLET | Freq: Three times a day (TID) | ORAL | Status: DC | PRN

## 2016-05-03 MED ORDER — OXCARBAZEPINE 150 MG PO TABS
150.0000 mg | ORAL_TABLET | Freq: Four times a day (QID) | ORAL | Status: DC
  Administered 2016-05-03 – 2016-05-06 (×10): 150 mg via ORAL
  Filled 2016-05-03 (×13): qty 1

## 2016-05-03 MED ORDER — ENOXAPARIN SODIUM 30 MG/0.3ML ~~LOC~~ SOLN
30.0000 mg | SUBCUTANEOUS | Status: DC
  Administered 2016-05-03 – 2016-05-05 (×3): 30 mg via SUBCUTANEOUS
  Filled 2016-05-03 (×3): qty 0.3

## 2016-05-03 MED ORDER — ONDANSETRON HCL 4 MG/2ML IJ SOLN
4.0000 mg | Freq: Four times a day (QID) | INTRAMUSCULAR | Status: DC | PRN
  Administered 2016-05-06: 4 mg via INTRAVENOUS
  Filled 2016-05-03: qty 2

## 2016-05-03 MED ORDER — CYCLOBENZAPRINE HCL 10 MG PO TABS: 10.0000 mg | ORAL_TABLET | Freq: Once | ORAL | Status: DC

## 2016-05-03 MED ORDER — LEVOTHYROXINE SODIUM 25 MCG PO TABS
25.0000 ug | ORAL_TABLET | Freq: Every day | ORAL | Status: DC
  Administered 2016-05-04 – 2016-05-07 (×4): 25 ug via ORAL
  Filled 2016-05-03 (×4): qty 1

## 2016-05-03 MED ORDER — INSULIN ASPART 100 UNIT/ML IV SOLN
10.0000 [IU] | Freq: Once | INTRAVENOUS | Status: AC
  Administered 2016-05-03: 10 [IU] via INTRAVENOUS
  Filled 2016-05-03: qty 1

## 2016-05-03 NOTE — Progress Notes (Signed)
Called ER RN for report. Room ready.  

## 2016-05-03 NOTE — ED Notes (Signed)
Olevia Bowens, MD at bedside at this time

## 2016-05-03 NOTE — ED Notes (Addendum)
Critical potassium 6.8, no hemolysis. Clayton Bibles PA notified

## 2016-05-03 NOTE — ED Triage Notes (Addendum)
Pt arrived by [facility]. Pt reports frequent falls recently and tremors (which she has appt for on Monday). Pt reports falling yesterday @ 1500, was unable to get up and laid in floor all night. Reports having back and right leg pain since the fall. Pt also reports urinating prior to leaving her home and it was very dark with foul odor and blood in her urine.

## 2016-05-03 NOTE — H&P (Signed)
History and Physical    [name] [id number] DOB: 03/27/1951 DOA: 05/03/2016  PCP: Joya Gaskins, MD   Patient coming from: Home.  Chief Complaint: Weakness.  HPI: [name] is a 65 y.o. female with medical history significant of anemia, cataracts, chronic back pain, fibromyalgia, fusion of the lumbar spine, depression, GERD, hemorrhoids, colon polyps, urolithiasis, hypertension, type 2 diabetes, hypothyroidism, idiopathic thrombocytopenia who was brought to the emergency department via rocking and EMS reporting tremors and frequent falls recently, including a fall yesterday at around 1500 with inability to get up and having to lie down on the floor all night.   Per patient, she has been feeling weak for the past 2-3 weeks. She has noticed also the development of tremors during this period. She states that she thought it was her high dose prednisone (40 mg by mouth daily) and discontinued it, but this has not affected her symptoms in any way. She states that over the past few days, she has been increasingly weak, has fallen about 4 times, including this last fall at home in her walking closet. She is not sure if she was lying down on the floor for 1 or 2 days, but she thinks that it happened yesterday. She denies fever, chills, headache, sore throat, productive cough, diarrhea, dysuria or hematuria. She denies chest pain, palpitations, diaphoresis, nausea, PND, orthopnea or pitting edema of the lower extremities. She sometimes gets mildly dyspneic and lightheaded.   ED Course: Workup in the ER shows hyperkalemia, mild anemia, AKI, EKG was sinus tachycardia. The patient received IV fluids, oral Kayexalate, albuterol nebulized, IV dextrose and insulin for hyperkalemia treatment. She states that she feels a little better.  Review of Systems: As per HPI otherwise 10 point review of systems negative.    Past Medical History:  Diagnosis Date  . Anemia   . Cataracts, bilateral    . Chronic back pain    scoliosis/displacement of lumbosacral intervertebral disc/stenosis  . Depression    but doens't take any meds  . Diabetes mellitus without complication ([place])    takes Metformin daily  . Family history of adverse reaction to anesthesia    pts mom would get short of breath after anesthesia but was a smoker  . Fibromyalgia    takes Gabapentin daily  . Fusion of lumbar spine   . GERD (gastroesophageal reflux disease)    takes Nexium daily  . Heart murmur   . Hemorrhoids   . History of blood transfusion 2013   no abnormal reaction noted  . History of bronchitis 2009  . History of colon polyps    benign  . History of hiatal hernia   . History of kidney stones    has a kidney stone now  . History of migraine    hasn't had one in over a yr  . History of surgery on arm    plates and screws  . Hypertension   . Hypothyroidism    takes Synthroid daily  . Idiopathic thrombocytopenia ([place])   . Insomnia    takes Restoril nightly as needed   . Memory loss   . Muscle spasm    takes Flexeril daily as needed  . Numbness and tingling    both toes  . PONV (postoperative nausea and vomiting)   . Shortness of breath dyspnea    with exertion but states its bc she can't exercise  . Urinary frequency   . Vitamin D deficiency     Past  Surgical History:  Procedure Laterality Date  . ABDOMINAL HYSTERECTOMY    . CARDIAC CATHETERIZATION  15yrs ago  . CERVICAL FUSION    . COLONOSCOPY    . ESOPHAGOGASTRODUODENOSCOPY    . GASTRIC BYPASS    . JOINT REPLACEMENT Bilateral   . kidney stone removed    . KNEE ARTHROSCOPY     total of 7 between both knees  . left arm surgery     pins  . LUMBAR FUSION    . TONSILLECTOMY    . tummy tuck        reports that she has never smoked. She has never used smokeless tobacco. She reports that she does not drink alcohol or use drugs.  Allergies  Allergen Reactions  . Formaldehyde     Other reaction(s): Shortness Of Breath,  coughing  . Altace [Ramipril] Cough  . Morphine And Related     itching    Family History  Problem Relation Age of Onset  . Lung cancer Mother   . COPD Mother   . Hypertension Mother   . Depression Mother   . Lymphoma Paternal Uncle   . Diabetes Maternal Grandmother     x 5 uncles  . Heart disease      Maternal and Paternal     Prior to Admission medications   Medication Sig Start Date End Date Taking? Authorizing Provider  cyclobenzaprine (FLEXERIL) 10 MG tablet Take 10 mg by mouth 3 (three) times daily as needed for muscle spasms.    Historical Provider, MD  estradiol (ESTRACE) 1 MG tablet Take 1 mg by mouth daily.    Historical Provider, MD  gabapentin (NEURONTIN) 600 MG tablet Take 600 mg by mouth 3 (three) times daily.    Historical Provider, MD  levothyroxine (SYNTHROID, LEVOTHROID) 25 MCG tablet Take 25 mcg by mouth daily before breakfast.    Historical Provider, MD  meloxicam (MOBIC) 7.5 MG tablet Take 1 tablet (7.5 mg total) by mouth daily. 12/23/14   Historical Provider, MD  metFORMIN (GLUCOPHAGE) 1000 MG tablet Take 1,000 mg by mouth 2 (two) times daily with a meal.    Historical Provider, MD  omeprazole (PRILOSEC) 20 MG capsule Take 1 capsule (20 mg total) by mouth 2 (two) times daily before a meal. 03/01/15   Lafayette Dragon, MD  OXcarbazepine (TRILEPTAL) 150 MG tablet Take one tablet four times daily. 12/19/15   Marcial Pacas, MD  oxyCODONE 20 MG TABS Take 1 tablet (20 mg total) by mouth every 4 (four) hours as needed for moderate pain. 02/12/15   Newman Pies, MD  permethrin (ELIMITE) 5 % cream Apply topically once a week.  02/13/16   Historical Provider, MD  predniSONE (DELTASONE) 20 MG tablet Take 40mg  x 14 days. 02/13/16   Historical Provider, MD  promethazine (PHENERGAN) 25 MG tablet Take 25 mg by mouth every 6 (six) hours as needed for nausea or vomiting.    Historical Provider, MD  temazepam (RESTORIL) 30 MG capsule TAKE 1 BY MOUTH NIGHTLY AS NEEDED 10/21/14   Historical  Provider, MD  triamcinolone cream (KENALOG) 0.1 % APPLY TOPICALLY AA BID PRN 01/13/16   Historical Provider, MD  valsartan (DIOVAN) 80 MG tablet Take 1 tablet (80 mg total) by mouth daily. 08/22/15   Historical Provider, MD    Physical Exam: Vitals:   05/03/16 1536 05/03/16 1556 05/03/16 1645 05/03/16 1816  BP: 90/60 106/71 112/62 130/78  Pulse: 115 110 108 119  Resp: 18 20 17 13   Temp:  TempSrc:      SpO2: 100% 99% 99% 100%      Constitutional: Mildly anxious, tremulous, but otherwise in no acute distress. Vitals:   05/03/16 1536 05/03/16 1556 05/03/16 1645 05/03/16 1816  BP: 90/60 106/71 112/62 130/78  Pulse: 115 110 108 119  Resp: 18 20 17 13   Temp:      TempSrc:      SpO2: 100% 99% 99% 100%   Eyes: PERRL, lids and conjunctivae normal ENMT: Mucous membranes are very dry. Posterior pharynx clear of any exudate or lesions. Neck: normal, supple, no masses, no thyromegaly Respiratory: clear to auscultation bilaterally, no wheezing, no crackles. Normal respiratory effort. No accessory muscle use.  Cardiovascular: Tachycardic 120 BPM, no murmurs / rubs / gallops. No extremity edema. 2+ pedal pulses. No carotid bruits.  Abdomen: no tenderness, no masses palpated. No hepatosplenomegaly. Bowel sounds positive.  Musculoskeletal: no clubbing / cyanosis. No joint deformity upper and lower extremities. Good ROM, no contractures. Normal muscle tone.  Skin: Scattered small lacerations on his extremities, skin is very dry and peeling. Neurologic: CN 2-12 grossly intact. Sensation intact, basal tremors, DTR normal. Global weakness.  Psychiatric: Normal judgment and insight. Alert and oriented x 4. Anxious mood.     Labs on Admission: I have personally reviewed following labs and imaging studies  CBC:  Recent Labs Lab 05/03/16 1352  WBC 10.1  HGB 11.5*  HCT 35.9*  MCV 91.8  PLT [id number]   Basic Metabolic Panel:  Recent Labs Lab 05/03/16 1352 05/03/16 1645  NA 129*  --   K  6.0* 6.8*  CL 101  --   CO2 18*  --   GLUCOSE 146*  --   BUN 38*  --   CREATININE 2.78*  --   CALCIUM 8.3*  --    GFR: CrCl cannot be calculated (Unknown ideal weight.). Liver Function Tests:  Recent Labs Lab 05/03/16 1352  AST 29  ALT 31  ALKPHOS 109  BILITOT 0.5  PROT 4.9*  ALBUMIN 2.5*    Recent Labs Lab 05/03/16 1352  LIPASE 11   Cardiac Enzymes:  Recent Labs Lab 05/03/16 1352  CKTOTAL 545*   Urine analysis:    Component Value Date/Time   COLORURINE AMBER (A) 05/03/2016 1712   APPEARANCEUR CLEAR 05/03/2016 1712   LABSPEC 1.020 05/03/2016 1712   PHURINE 5.0 05/03/2016 1712   GLUCOSEU NEGATIVE 05/03/2016 1712   HGBUR NEGATIVE 05/03/2016 1712   BILIRUBINUR SMALL (A) 05/03/2016 1712   KETONESUR 15 (A) 05/03/2016 1712   PROTEINUR NEGATIVE 05/03/2016 1712   NITRITE NEGATIVE 05/03/2016 1712   LEUKOCYTESUR NEGATIVE 05/03/2016 1712    Radiological Exams on Admission: Dg Chest 2 View  Result Date: 05/03/2016 CLINICAL DATA:  Fall 2 days ago with shortness of breath, initial encounter EXAM: CHEST  2 VIEW COMPARISON:  12/28/2010 FINDINGS: Cardiac shadow is within normal limits. The lungs are well aerated bilaterally. No focal infiltrate or sizable effusion is seen. Postsurgical changes in the cervical spine are noted. No acute bony abnormality is seen. IMPRESSION: No active cardiopulmonary disease. Electronically Signed   By: Inez Catalina M.D.   On: 05/03/2016 17:46  Dg Shoulder Right  Result Date: 05/03/2016 CLINICAL DATA:  Fall 2 days ago with persistent right shoulder pain, initial encounter EXAM: RIGHT SHOULDER - 2+ VIEW COMPARISON:  None. FINDINGS: Acromioclavicular degenerative changes are noted. No fracture or dislocation is seen. The underlying bony thorax is within normal limits. Postsurgical changes are noted in the cervical spine. IMPRESSION: No  acute abnormality noted. Electronically Signed   By: Inez Catalina M.D.   On: 05/03/2016 17:50  Ct Head Wo  Contrast  Result Date: 05/03/2016 CLINICAL DATA:  Fall, weakness. EXAM: CT HEAD WITHOUT CONTRAST TECHNIQUE: Contiguous axial images were obtained from the base of the skull through the vertex without intravenous contrast. COMPARISON:  None. FINDINGS: Brain: Ventricles are within normal limits in size and configuration. All areas of the brain demonstrate normal gray-white matter attenuation. There is no mass, hemorrhage, edema or other evidence of acute parenchymal abnormality. No extra-axial hemorrhage. Vascular: No hyperdense vessel or unexpected calcification. There are chronic calcified atherosclerotic changes of the large vessels at the skull base. Skull: Negative for fracture or focal lesion. Sinuses/Orbits: No acute findings. Other: None. IMPRESSION: Negative head CT.  No intracranial mass, hemorrhage or edema. Electronically Signed   By: Franki Cabot M.D.   On: 05/03/2016 18:01  Dg Foot Complete Right  Result Date: 05/03/2016 CLINICAL DATA:  Recent fall, subsequent encounter EXAM: RIGHT FOOT COMPLETE - 3+ VIEW COMPARISON:  05/01/2016 FINDINGS: No acute fracture or dislocation is noted. No soft tissue changes are seen. Small calcaneal spur is noted. IMPRESSION: No acute abnormality noted. No significant interval change from the study of 2 days previous. Electronically Signed   By: Inez Catalina M.D.   On: 05/03/2016 17:48  Dg Hip Unilat W Or Wo Pelvis 2-3 Views Right  Result Date: 05/03/2016 CLINICAL DATA:  Fall 2 days ago with right hip pain, initial encounter EXAM: DG HIP (WITH OR WITHOUT PELVIS) 2-3V RIGHT COMPARISON:  None. FINDINGS: Pelvic ring is intact. Postsurgical changes are noted in the lower lumbar spine. No acute fracture or dislocation is noted. Mild degenerative changes of the hip joints are seen bilaterally. Diffuse vascular calcifications are seen. IMPRESSION: No acute abnormality noted. Electronically Signed   By: Inez Catalina M.D.   On: 05/03/2016 17:47 Echocardiogram  09/15/2015  ------------------------------------------------------------------- LV EF: 60% -   65%  ------------------------------------------------------------------- History:   PMH:   Transient ischemic attack.  Risk factors: Lifelong nonsmoker. Hypertension. Diabetes mellitus. Obese.  ------------------------------------------------------------------- Study Conclusions  - Left ventricle: The cavity size was normal. Wall thickness was   increased in a pattern of mild LVH. Systolic function was normal.   The estimated ejection fraction was in the range of 60% to 65%.   Wall motion was normal; there were no regional wall motion   abnormalities. Doppler parameters are consistent with abnormal   left ventricular relaxation (grade 1 diastolic dysfunction). - Aortic valve: Mildly calcified annulus. Mildly thickened   leaflets. Uncertain number of leaflets, cannot excluded possible   bicuspid valve. Valve area (VTI): 2.64 cm^2. Valve area (Vmax):   2.58 cm^2. Valve area (Vmean): 2.27 cm^2. - Mitral valve: There was mild regurgitation. - Left atrium: The atrium was mildly dilated. - Atrial septum: No defect or patent foramen ovale was identified. - Pulmonary arteries: PA peak pressure: 32 mm Hg (S). PASP is   borderline elevated. - Technically adequate study.  EKG: Independently reviewed. Vent. rate 109 BPM PR interval * ms QRS duration 91 ms QT/QTc 309/416 ms P-R-T axes 98 49 45 Sinus tachycardia Low voltage, precordial leads Artifact in lead(s) I II III aVR aVL  Assessment/Plan Principal Problem:   AKI (acute kidney injury) ([place]) Admit to telemetry/inpatient. Continue IV fluid challenge. Hold valsartan, metformin and meloxicam. Monitor intake and output. Check urine random sodium level. Check ANA and rheumatoid factor. Follow-up electrolytes, renal function and total CK. Consider expanding workup and  nephrology evaluation if no improvement.  Active Problems:    Hyperkalemia The patient received IV fluids, dextrose, insulin, albuterol and Kayexalate in the emergency department. Continue IV fluids. Calcium gluconate 1 g 1. Follow-up potassium level closely.    Hyponatremia Check urine sodium and osmolality. Continue IV hydration with normal saline.    Rhabdomyolysis Continue IV fluids. Follow total CK level in the morning.    Weakness PT and OT evaluation in the morning. Given her tremors, consider neurology evaluation if no improvement.    Spondylolisthesis of lumbar region Continue gabapentin 600 mg by mouth 3 times a day. Continue Flexeril 10 mg by mouth 3 times a day as needed. Continue oxycodone 20 mg by mouth 3 times a day as needed.    Fibromyalgia Continue treatment as above.    Hypothyroidism Continue levothyroxine 25 g daily.. Check TSH.    Hypertension Hold Micardis due to AKI. Monitor blood pressure closely. When necessary antihypertensives      Type 2 diabetes mellitus ([facility]) Carbohydrate modified diet. Hold metformin due to elevated creatinine. CBG monitoring with regular insulin sliding scale.    Anemia Check anemia profile and monitor H&H.    GERD (gastroesophageal reflux disease) Continue daily proton pump inhibitor. Pantoprazole 40 mg by mouth daily while in the hospital        DVT prophylaxis: Lovenox. Code Status: Full code. Family Communication:  Disposition Plan: Admit for fluid challenge, hyperkalemia treatment and further evaluation. Consults called:  Admission status: Inpatient/telemetry   [name] MD [facility] Pager [phone].  If 7PM-7AM, please contact night-coverage [email or web address] Password [facility]  05/03/2016, 6:57 PM

## 2016-05-03 NOTE — Progress Notes (Signed)
New Admission Note:  Arrival Method: Stretcher Mental Orientation: alert and oriented x 4 Telemetry: box 28 ST 120's Assessment: Completed Skin: warm and dry  IV: left AC NSL  Pain: 6/10 back chronic  Tubes: N/A Safety Measures: Safety Fall Prevention Plan was given, discussed and signed. Admission: Completed [address] Orientation: Patient has been orientated to the room, unit and the staff. Family: None   Orders have been reviewed and implemented. Will continue to monitor the patient. Call light has been placed within reach and bed alarm has been activated.   Sima Matas BSN, RN  Phone Number: [phone]

## 2016-05-03 NOTE — ED Provider Notes (Signed)
[facility] DEPT Provider Note   CSN: [id number] Arrival date & time: 05/03/16  1335  First Provider Contact:  None       History   Chief Complaint Chief Complaint  Patient presents with  . Fall    HPI [name] is a 65 y.o. female.  HPI   Patient presents with two episodes of sudden loss of postural tone that occurred yesterday.  Pt states she was standing on the sidewalk talking to a family member yesterday when she suddenly fell straight down.  Denies LOC.  States she just "went down."  Felt tired afterward and was going to take a nap but ended up falling again in the same way.  She got stuck on the floor overnight because she was unable to stand or get up.  Eventually scooted down the hallway and was able to call her family.  Has been SOB with any activity since the original fall and has pain in her right shoulder, right hip, right foot.  Also is feeling cold. Urinated before EMS took her from the house and found that the urine was dark, malodorous and bloody.  Was recently on 40mg  prednisone daily for several months, stopped 3 weeks ago.     Past Medical History:  Diagnosis Date  . Anemia   . Cataracts, bilateral   . Chronic back pain    scoliosis/displacement of lumbosacral intervertebral disc/stenosis  . Depression    but doens't take any meds  . Diabetes ([place])   . Diabetes mellitus without complication ([place])    takes Metformin daily  . Family history of adverse reaction to anesthesia    pts mom would get short of breath after anesthesia but was a smoker  . Fibromyalgia    takes Gabapentin daily  . Fusion of lumbar spine   . GERD (gastroesophageal reflux disease)    takes Nexium daily  . Heart murmur   . Hemorrhoids   . History of blood transfusion 2013   no abnormal reaction noted  . History of bronchitis 2009  . History of colon polyps    benign  . History of hiatal hernia   . History of kidney stones    has a kidney stone now  . History of  migraine    hasn't had one in over a yr  . History of surgery on arm    plates and screws  . Hypertension    takes Micardis daily  . Hypertension   . Hypothyroidism    takes Synthroid daily  . Idiopathic thrombocytopenia ([place])   . Insomnia    takes Restoril nightly as needed   . Memory loss   . Muscle spasm    takes Flexeril daily as needed  . Numbness and tingling    both toes  . PONV (postoperative nausea and vomiting)   . Shortness of breath dyspnea    with exertion but states its bc she can't exercise  . Urinary frequency   . Vitamin D deficiency     Patient Active Problem List   Diagnosis Date Noted  . Neck pain 02/16/2016  . Muscle cramp 11/09/2015  . Left-sided weakness 08/29/2015  . Spondylolisthesis of lumbar region 02/08/2015    Past Surgical History:  Procedure Laterality Date  . ABDOMINAL HYSTERECTOMY    . CARDIAC CATHETERIZATION  61yrs ago  . CERVICAL FUSION    . COLONOSCOPY    . ESOPHAGOGASTRODUODENOSCOPY    . GASTRIC BYPASS    . JOINT REPLACEMENT Bilateral   .  kidney stone removed    . KNEE ARTHROSCOPY     total of 7 between both knees  . left arm surgery     pins  . LUMBAR FUSION    . TONSILLECTOMY    . tummy tuck       OB History    No data available       Home Medications    Prior to Admission medications   Medication Sig Start Date End Date Taking? Authorizing Provider  cyclobenzaprine (FLEXERIL) 10 MG tablet Take 10 mg by mouth 3 (three) times daily as needed for muscle spasms.    Historical Provider, MD  estradiol (ESTRACE) 1 MG tablet Take 1 mg by mouth daily.    Historical Provider, MD  gabapentin (NEURONTIN) 600 MG tablet Take 600 mg by mouth 3 (three) times daily.    Historical Provider, MD  levothyroxine (SYNTHROID, LEVOTHROID) 25 MCG tablet Take 25 mcg by mouth daily before breakfast.    Historical Provider, MD  meloxicam (MOBIC) 7.5 MG tablet Take 1 tablet (7.5 mg total) by mouth daily. 12/23/14   Historical Provider, MD    metFORMIN (GLUCOPHAGE) 1000 MG tablet Take 1,000 mg by mouth 2 (two) times daily with a meal.    Historical Provider, MD  omeprazole (PRILOSEC) 20 MG capsule Take 1 capsule (20 mg total) by mouth 2 (two) times daily before a meal. 03/01/15   Lafayette Dragon, MD  OXcarbazepine (TRILEPTAL) 150 MG tablet Take one tablet four times daily. 12/19/15   Marcial Pacas, MD  oxyCODONE 20 MG TABS Take 1 tablet (20 mg total) by mouth every 4 (four) hours as needed for moderate pain. 02/12/15   Newman Pies, MD  permethrin (ELIMITE) 5 % cream Apply topically once a week.  02/13/16   Historical Provider, MD  predniSONE (DELTASONE) 20 MG tablet Take 40mg  x 14 days. 02/13/16   Historical Provider, MD  promethazine (PHENERGAN) 25 MG tablet Take 25 mg by mouth every 6 (six) hours as needed for nausea or vomiting.    Historical Provider, MD  temazepam (RESTORIL) 30 MG capsule TAKE 1 BY MOUTH NIGHTLY AS NEEDED 10/21/14   Historical Provider, MD  triamcinolone cream (KENALOG) 0.1 % APPLY TOPICALLY AA BID PRN 01/13/16   Historical Provider, MD  valsartan (DIOVAN) 80 MG tablet Take 1 tablet (80 mg total) by mouth daily. 08/22/15   Historical Provider, MD    Family History Family History  Problem Relation Age of Onset  . Lung cancer Mother   . COPD Mother   . Hypertension Mother   . Depression Mother   . Lymphoma Paternal Uncle   . Diabetes Maternal Grandmother     x 5 uncles  . Heart disease      Maternal and Paternal    Social History Social History  Substance Use Topics  . Smoking status: Never Smoker  . Smokeless tobacco: Never Used  . Alcohol use No     Allergies   Formaldehyde; Altace [ramipril]; and Morphine and related   Review of Systems Review of Systems  All other systems reviewed and are negative.    Physical Exam Updated Vital Signs BP 106/71   Pulse 110   Temp 97.5 F (36.4 C) (Oral)   Resp 20   SpO2 99%   Physical Exam  Constitutional: She appears well-developed and well-nourished.  No distress.  HENT:  Head: Normocephalic and atraumatic.  Neck: Neck supple.  Cardiovascular: Normal rate and regular rhythm.   Pulmonary/Chest: Effort normal and  breath sounds normal. Tachypnea noted. No respiratory distress. She has no decreased breath sounds. She has no wheezes. She has no rhonchi. She has no rales.  Speaking fast in complete sentences   Abdominal: Soft. She exhibits no distension. There is no tenderness. There is no rebound and no guarding.  Neurological: She is alert.  CN II-XII intact, EOMs intact, no pronator drift, grip strengths equal bilaterally; strength 5/5 in all extremities, sensation intact in all extremities; finger to nose, heel to shin, rapid alternating movements normal.      Skin: She is not diaphoretic. There is pallor.  Nursing note and vitals reviewed.    ED Treatments / Results  Labs (all labs ordered are listed, but only abnormal results are displayed) Labs Reviewed  COMPREHENSIVE METABOLIC PANEL - Abnormal; Notable for the following:       Result Value   Sodium 129 (*)    Potassium 6.0 (*)    CO2 18 (*)    Glucose, Bld 146 (*)    BUN 38 (*)    Creatinine, Ser 2.78 (*)    Calcium 8.3 (*)    Total Protein 4.9 (*)    Albumin 2.5 (*)    GFR calc non Af Amer 17 (*)    GFR calc Af Amer 19 (*)    All other components within normal limits  CBC - Abnormal; Notable for the following:    Hemoglobin 11.5 (*)    HCT 35.9 (*)    RDW 15.6 (*)    All other components within normal limits  URINALYSIS, ROUTINE W REFLEX MICROSCOPIC (NOT AT [facility]) - Abnormal; Notable for the following:    Color, Urine AMBER (*)    Bilirubin Urine SMALL (*)    Ketones, ur 15 (*)    All other components within normal limits  CK - Abnormal; Notable for the following:    Total CK 545 (*)    All other components within normal limits  POTASSIUM - Abnormal; Notable for the following:    Potassium 6.8 (*)    All other components within normal limits  URINE CULTURE   LIPASE, BLOOD  CBG MONITORING, ED    EKG  EKG Interpretation None       Radiology Dg Chest 2 View  Result Date: 05/03/2016 CLINICAL DATA:  Fall 2 days ago with shortness of breath, initial encounter EXAM: CHEST  2 VIEW COMPARISON:  12/28/2010 FINDINGS: Cardiac shadow is within normal limits. The lungs are well aerated bilaterally. No focal infiltrate or sizable effusion is seen. Postsurgical changes in the cervical spine are noted. No acute bony abnormality is seen. IMPRESSION: No active cardiopulmonary disease. Electronically Signed   By: Inez Catalina M.D.   On: 05/03/2016 17:46  Dg Shoulder Right  Result Date: 05/03/2016 CLINICAL DATA:  Fall 2 days ago with persistent right shoulder pain, initial encounter EXAM: RIGHT SHOULDER - 2+ VIEW COMPARISON:  None. FINDINGS: Acromioclavicular degenerative changes are noted. No fracture or dislocation is seen. The underlying bony thorax is within normal limits. Postsurgical changes are noted in the cervical spine. IMPRESSION: No acute abnormality noted. Electronically Signed   By: Inez Catalina M.D.   On: 05/03/2016 17:50  Ct Head Wo Contrast  Result Date: 05/03/2016 CLINICAL DATA:  Fall, weakness. EXAM: CT HEAD WITHOUT CONTRAST TECHNIQUE: Contiguous axial images were obtained from the base of the skull through the vertex without intravenous contrast. COMPARISON:  None. FINDINGS: Brain: Ventricles are within normal limits in size and configuration. All areas of  the brain demonstrate normal gray-white matter attenuation. There is no mass, hemorrhage, edema or other evidence of acute parenchymal abnormality. No extra-axial hemorrhage. Vascular: No hyperdense vessel or unexpected calcification. There are chronic calcified atherosclerotic changes of the large vessels at the skull base. Skull: Negative for fracture or focal lesion. Sinuses/Orbits: No acute findings. Other: None. IMPRESSION: Negative head CT.  No intracranial mass, hemorrhage or edema.  Electronically Signed   By: Franki Cabot M.D.   On: 05/03/2016 18:01  Dg Foot Complete Right  Result Date: 05/03/2016 CLINICAL DATA:  Recent fall, subsequent encounter EXAM: RIGHT FOOT COMPLETE - 3+ VIEW COMPARISON:  05/01/2016 FINDINGS: No acute fracture or dislocation is noted. No soft tissue changes are seen. Small calcaneal spur is noted. IMPRESSION: No acute abnormality noted. No significant interval change from the study of 2 days previous. Electronically Signed   By: Inez Catalina M.D.   On: 05/03/2016 17:48  Dg Hip Unilat W Or Wo Pelvis 2-3 Views Right  Result Date: 05/03/2016 CLINICAL DATA:  Fall 2 days ago with right hip pain, initial encounter EXAM: DG HIP (WITH OR WITHOUT PELVIS) 2-3V RIGHT COMPARISON:  None. FINDINGS: Pelvic ring is intact. Postsurgical changes are noted in the lower lumbar spine. No acute fracture or dislocation is noted. Mild degenerative changes of the hip joints are seen bilaterally. Diffuse vascular calcifications are seen. IMPRESSION: No acute abnormality noted. Electronically Signed   By: Inez Catalina M.D.   On: 05/03/2016 17:47   Procedures Procedures (including critical care time)  Medications Ordered in ED Medications  sodium chloride 0.9 % bolus 1,000 mL (not administered)  sodium chloride 0.9 % bolus 1,000 mL (0 mLs Intravenous Stopped 05/03/16 1815)  albuterol (PROVENTIL) (2.5 MG/3ML) 0.083% nebulizer solution 5 mg (5 mg Nebulization Given 05/03/16 1656)  insulin aspart (novoLOG) injection 10 Units (10 Units Intravenous Given 05/03/16 1816)  dextrose 50 % solution 50 mL (50 mLs Intravenous Given 05/03/16 1817)  albuterol (PROVENTIL) (2.5 MG/3ML) 0.083% nebulizer solution 5 mg (5 mg Nebulization Given 05/03/16 1817)  sodium polystyrene (KAYEXALATE) 15 GM/60ML suspension 30 g (30 g Oral Given 05/03/16 1817)     Initial Impression / Assessment and Plan / ED Course  I have reviewed the triage vital signs and the nursing notes.  Pertinent labs & imaging  results that were available during my care of the patient were reviewed by me and considered in my medical decision making (see chart for details).  Clinical Course    Afebrile nontoxic patient presents with generalized weakness after being stuck on the floor overnight following a fall.  She fell twice yesterday without known reason.  Denies LOC.  No significant injuries found from her falls.  Labs demonstrated new renal failure with hyperkalemia.  Pt given IVF, albuterol, insulin, dextrose, kayexalate in ED.  Pt discussed with Dr Dayna Barker who also saw the patient.  Pt admitted to [facility], Dr Olevia Bowens accepting.    Final Clinical Impressions(s) / ED Diagnoses   Final diagnoses:  Renal failure  Hyperkalemia  Fall, initial encounter  Dehydration    New Prescriptions New Prescriptions   No medications on file     Clayton Bibles, PA-C 05/03/16 1843    Merrily Pew, MD 05/03/16 2139

## 2016-05-03 NOTE — ED Provider Notes (Signed)
Medical screening examination/treatment/procedure(s) were conducted as a shared visit with non-physician practitioner(s) and myself.  I personally evaluated the patient during the encounter.  65 yo F w/ increased weakness and falls recently. No syncope. No other symptoms.  On exam is very verbose, oriented, normal vitals, heart rrr, lungs ctab.  Labs with AKI, slightly elevated CK, mild rhabdo. otherwise ok.  Plan for medicine admission.    EKG Interpretation  Date/Time:  Thursday May 03 2016 16:11:16 EDT Ventricular Rate:  109 PR Interval:    QRS Duration: 91 QT Interval:  309 QTC Calculation: 416 R Axis:   49 Text Interpretation:  Sinus tachycardia Low voltage, precordial leads Artifact in lead(s) I II III aVR aVL poor baseline Confirmed by [facility] MD, Corene Cornea [phone]) on 05/03/2016 9:38:14 PM         Merrily Pew, MD 05/03/16 2140

## 2016-05-04 DIAGNOSIS — E875 Hyperkalemia: Secondary | ICD-10-CM

## 2016-05-04 LAB — CBC WITH DIFFERENTIAL/PLATELET
Basophils Absolute: 0 10*3/uL (ref 0.0–0.1)
Basophils Relative: 0 %
EOS ABS: 0 10*3/uL (ref 0.0–0.7)
Eosinophils Relative: 0 %
HCT: 29 % — ABNORMAL LOW (ref 36.0–46.0)
HEMOGLOBIN: 9.5 g/dL — AB (ref 12.0–15.0)
LYMPHS ABS: 1.4 10*3/uL (ref 0.7–4.0)
Lymphocytes Relative: 19 %
MCH: 29.5 pg (ref 26.0–34.0)
MCHC: 32.8 g/dL (ref 30.0–36.0)
MCV: 90.1 fL (ref 78.0–100.0)
MONO ABS: 1.1 10*3/uL — AB (ref 0.1–1.0)
MONOS PCT: 15 %
NEUTROS PCT: 66 %
Neutro Abs: 4.8 10*3/uL (ref 1.7–7.7)
Platelets: 190 10*3/uL (ref 150–400)
RBC: 3.22 MIL/uL — ABNORMAL LOW (ref 3.87–5.11)
RDW: 15.7 % — AB (ref 11.5–15.5)
WBC: 7.4 10*3/uL (ref 4.0–10.5)

## 2016-05-04 LAB — COMPREHENSIVE METABOLIC PANEL
ALK PHOS: 98 U/L (ref 38–126)
ALT: 25 U/L (ref 14–54)
ANION GAP: 6 (ref 5–15)
AST: 25 U/L (ref 15–41)
Albumin: 2 g/dL — ABNORMAL LOW (ref 3.5–5.0)
BILIRUBIN TOTAL: 0.4 mg/dL (ref 0.3–1.2)
BUN: 36 mg/dL — ABNORMAL HIGH (ref 6–20)
CALCIUM: 7.8 mg/dL — AB (ref 8.9–10.3)
CO2: 20 mmol/L — AB (ref 22–32)
Chloride: 108 mmol/L (ref 101–111)
Creatinine, Ser: 2.2 mg/dL — ABNORMAL HIGH (ref 0.44–1.00)
GFR calc non Af Amer: 22 mL/min — ABNORMAL LOW (ref >60)
GFR, EST AFRICAN AMERICAN: 26 mL/min — AB (ref >60)
Glucose, Bld: 100 mg/dL — ABNORMAL HIGH (ref 65–99)
Potassium: 5.5 mmol/L — ABNORMAL HIGH (ref 3.5–5.1)
SODIUM: 134 mmol/L — AB (ref 135–145)
TOTAL PROTEIN: 4 g/dL — AB (ref 6.5–8.1)

## 2016-05-04 LAB — GLUCOSE, CAPILLARY
GLUCOSE-CAPILLARY: 153 mg/dL — AB (ref 65–99)
GLUCOSE-CAPILLARY: 93 mg/dL (ref 65–99)
Glucose-Capillary: 76 mg/dL (ref 65–99)
Glucose-Capillary: 116 mg/dL — ABNORMAL HIGH (ref 65–99)

## 2016-05-04 LAB — URINE CULTURE

## 2016-05-04 LAB — BASIC METABOLIC PANEL
ANION GAP: 7 (ref 5–15)
BUN: 35 mg/dL — ABNORMAL HIGH (ref 6–20)
CALCIUM: 7.8 mg/dL — AB (ref 8.9–10.3)
CO2: 18 mmol/L — ABNORMAL LOW (ref 22–32)
Chloride: 109 mmol/L (ref 101–111)
Creatinine, Ser: 2.14 mg/dL — ABNORMAL HIGH (ref 0.44–1.00)
GFR, EST AFRICAN AMERICAN: 27 mL/min — AB (ref >60)
GFR, EST NON AFRICAN AMERICAN: 23 mL/min — AB (ref >60)
Glucose, Bld: 109 mg/dL — ABNORMAL HIGH (ref 65–99)
Potassium: 4.6 mmol/L (ref 3.5–5.1)
SODIUM: 134 mmol/L — AB (ref 135–145)

## 2016-05-04 MED ORDER — GABAPENTIN 300 MG PO CAPS
300.0000 mg | ORAL_CAPSULE | Freq: Three times a day (TID) | ORAL | Status: DC
  Administered 2016-05-04 – 2016-05-05 (×3): 300 mg via ORAL
  Filled 2016-05-04 (×3): qty 1

## 2016-05-04 MED ORDER — SODIUM POLYSTYRENE SULFONATE 15 GM/60ML PO SUSP
30.0000 g | Freq: Once | ORAL | Status: AC
  Administered 2016-05-04: 30 g via ORAL
  Filled 2016-05-04: qty 120

## 2016-05-04 NOTE — Progress Notes (Signed)
Bladder scanned pt showed 929ml. MD made aware. Given order to in and out cath pt. 900 ml output from cath. Will continue to assess and monitor pt.     Carole Civil, RN, [facility] [place] [address] [place] Phone [phone]

## 2016-05-04 NOTE — Care Management Important Message (Signed)
Important Message  Patient Details  Name: [name] MRN: [id number] Date of Birth: [birth year]   Medicare Important Message Given:  Yes    [name] 05/04/2016, 10:55 AM

## 2016-05-04 NOTE — Progress Notes (Signed)
Patient ID: [name], female   DOB: 1951-04-22, 65 y.o.   MRN: [id number]                                                                PROGRESS NOTE                                                                                                                                                                                                             Patient Demographics:    [name], is a 65 y.o. female, DOB - 01/03/1951, [id number]  Admit date - 05/03/2016   Admitting Physician [name], MD  Outpatient Primary MD for the patient is Joya Gaskins, MD  LOS - 2  Outpatient Specialists:    Chief Complaint  Patient presents with  . Fall       Brief Narrative   65 y.o. female with medical history significant of anemia, cataracts, chronic back pain, fibromyalgia, fusion of the lumbar spine, depression, GERD, hemorrhoids, colon polyps, urolithiasis, hypertension, type 2 diabetes, hypothyroidism, idiopathic thrombocytopenia who was brought to the emergency department via rocking and EMS reporting tremors and frequent falls recently, including a fall yesterday at around 1500 with inability to get up and having to lie down on the floor all night.   Per patient, she has been feeling weak for the past 2-3 weeks. She has noticed also the development of tremors during this period. She states that she thought it was her high dose prednisone (40 mg by mouth daily) and discontinued it, but this has not affected her symptoms in any way. She states that over the past few days, she has been increasingly weak, has fallen about 4 times, including this last fall at home in her walking closet. She is not sure if she was lying down on the floor for 1 or 2 days, but she thinks that it happened yesterday. She denies fever, chills, headache, sore throat, productive cough, diarrhea, dysuria or hematuria. She denies chest pain, palpitations, diaphoresis, nausea, PND, orthopnea or pitting  edema of the lower extremities. She sometimes gets mildly dyspneic and lightheaded.   ED Course: Workup in the ER shows hyperkalemia, mild anemia, AKI, EKG was sinus tachycardia. The patient received IV fluids, oral Kayexalate, albuterol nebulized, IV dextrose and insulin for hyperkalemia  treatment. She states that she feels a little better.   Subjective:    [name] today feels slightly better, still weak, having some hand tremors.  No headache, No chest pain, No abdominal pain - No Nausea, No new weakness tingling or numbness, No Cough/ sob   Assessment  & Plan :    Principal Problem:   AKI (acute kidney injury) ([place]) Active Problems:   Spondylolisthesis of lumbar region   Hyperkalemia   Hypothyroidism   Hypertension   Rhabdomyolysis   Type 2 diabetes mellitus (HCC)   Anemia   GERD (gastroesophageal reflux disease)   Fibromyalgia   Hyponatremia   Weakness  Tremor Pt would like evaluation by neurology  AKI (acute kidney injury) ([place]) Holding valsartan, metformin and meloxicam. Cont ns iv Check cmp in am     Hyperkalemia Check cmp in am Kayexalate 30gm po x1    Hyponatremia Cont ns iv    Rhabdomyolysis Cont ns iv    Weakness PT and OT     Spondylolisthesis of lumbar region Decrease gabapentin 300 mg by mouth 3 times a day. Due to renal insufficiency Continue Flexeril 10 mg by mouth 3 times a day as needed. Continue oxycodone 20 mg by mouth 3 times a day as needed.  Fibromyalgia Continue treatment as above.  Hypothyroidism Continue levothyroxine 25 g daily.. Check TSH.   Hypertension Hold ARB due to AKI. Monitor blood pressure closely. When necessary antihypertensives    Type 2 diabetes mellitus (HCC) Carbohydrate modified diet. Hold metformin due to elevated creatinine. CBG monitoring with regular insulin sliding scale.   Anemia Check anemia profile and monitor H&H.   GERD (gastroesophageal reflux disease) Continue daily  proton pump inhibitor. Pantoprazole 40 mg by mouth daily while in the hospital         Code Status : FULL CODE  Family Communication  :   Disposition Plan  :  home  Barriers For Discharge :   Consults  :    Procedures  :   DVT Prophylaxis  :   Lovenox - SCDs   Lab Results  Component Value Date   PLT 240 05/03/2016    Antibiotics  :    Anti-infectives    None        Objective:   Vitals:   05/03/16 1858 05/03/16 1915 05/03/16 2101 05/04/16 0427  BP: 106/56  113/60 (!) 109/50  Pulse: (!) 127 (!) 125 92 (!) 107  Resp: 19 25 20 18   Temp:   98.1 F (36.7 C) 98 F (36.7 C)  TempSrc:   Oral Oral  SpO2: 100% 99% 93% 94%  Weight:   82.1 kg (181 lb)   Height:   5\' 2"  (1.575 m)     Wt Readings from Last 3 Encounters:  05/03/16 82.1 kg (181 lb)  02/16/16 86 kg (189 lb 8 oz)  11/09/15 87.5 kg (193 lb)     Intake/Output Summary (Last 24 hours) at 05/04/16 0517 Last data filed at 05/04/16 0231  Gross per 24 hour  Intake          1558.33 ml  Output                0 ml  Net          1558.33 ml     Physical Exam  Awake Alert, Oriented X 3, No new F.N deficits, Normal affect [place].AT,PERRAL Supple Neck,No JVD, No cervical lymphadenopathy appriciated.  Symmetrical Chest wall movement, Good air movement bilaterally,  CTAB RRR,No Gallops,Rubs or new Murmurs, No Parasternal Heave +ve B.Sounds, Abd Soft, No tenderness, No organomegaly appriciated, No rebound - guarding or rigidity. No Cyanosis, Clubbing or edema, No new Rash or bruise   + tremor, no cogwheeling, good finger to nose.     Data Review:    CBC  Recent Labs Lab 05/03/16 1352  WBC 10.1  HGB 11.5*  HCT 35.9*  PLT 240  MCV 91.8  MCH 29.4  MCHC 32.0  RDW 15.6*    Chemistries   Recent Labs Lab 05/03/16 1352 05/03/16 1645 05/03/16 1948  NA 129*  --   --   K 6.0* 6.8*  --   CL 101  --   --   CO2 18*  --   --   GLUCOSE 146*  --   --   BUN 38*  --   --   CREATININE 2.78*  --   --     CALCIUM 8.3*  --   --   MG  --   --  1.7  AST 29  --   --   ALT 31  --   --   ALKPHOS 109  --   --   BILITOT 0.5  --   --    ------------------------------------------------------------------------------------------------------------------ No results for input(s): CHOL, HDL, LDLCALC, TRIG, CHOLHDL, LDLDIRECT in the last 72 hours.  No results found for: HGBA1C ------------------------------------------------------------------------------------------------------------------  Recent Labs  05/03/16 2219  TSH 2.344   ------------------------------------------------------------------------------------------------------------------  Recent Labs  05/03/16 1948  VITAMINB12 709  FOLATE 27.7  FERRITIN 48  TIBC 195*  IRON 21*  RETICCTPCT 1.7    Coagulation profile No results for input(s): INR, PROTIME in the last 168 hours.  No results for input(s): DDIMER in the last 72 hours.  Cardiac Enzymes No results for input(s): CKMB, TROPONINI, MYOGLOBIN in the last 168 hours.  Invalid input(s): CK ------------------------------------------------------------------------------------------------------------------ No results found for: BNP  Inpatient Medications  Scheduled Meds: . enoxaparin (LOVENOX) injection  30 mg Subcutaneous Q24H  . estradiol  1 mg Oral Daily  . gabapentin  600 mg Oral TID  . insulin aspart  0-9 Units Subcutaneous TID WC  . levothyroxine  25 mcg Oral QAC breakfast  . LORazepam  1 mg Intravenous Once  . OXcarbazepine  150 mg Oral QID  . pantoprazole  40 mg Oral Daily  . sodium chloride flush  3 mL Intravenous Q12H   Continuous Infusions: . sodium chloride 100 mL/hr at 05/03/16 2101   PRN Meds:.cyclobenzaprine, ondansetron **OR** ondansetron (ZOFRAN) IV, oxyCODONE, promethazine, temazepam, triamcinolone  Micro Results No results found for this or any previous visit (from the past 240 hour(s)).  Radiology Reports Dg Chest 2 View  Result Date:  05/03/2016 CLINICAL DATA:  Fall 2 days ago with shortness of breath, initial encounter EXAM: CHEST  2 VIEW COMPARISON:  12/28/2010 FINDINGS: Cardiac shadow is within normal limits. The lungs are well aerated bilaterally. No focal infiltrate or sizable effusion is seen. Postsurgical changes in the cervical spine are noted. No acute bony abnormality is seen. IMPRESSION: No active cardiopulmonary disease. Electronically Signed   By: Inez Catalina M.D.   On: 05/03/2016 17:46  Dg Shoulder Right  Result Date: 05/03/2016 CLINICAL DATA:  Fall 2 days ago with persistent right shoulder pain, initial encounter EXAM: RIGHT SHOULDER - 2+ VIEW COMPARISON:  None. FINDINGS: Acromioclavicular degenerative changes are noted. No fracture or dislocation is seen. The underlying bony thorax is within normal limits. Postsurgical changes are noted in the cervical  spine. IMPRESSION: No acute abnormality noted. Electronically Signed   By: Inez Catalina M.D.   On: 05/03/2016 17:50  Ct Head Wo Contrast  Result Date: 05/03/2016 CLINICAL DATA:  Fall, weakness. EXAM: CT HEAD WITHOUT CONTRAST TECHNIQUE: Contiguous axial images were obtained from the base of the skull through the vertex without intravenous contrast. COMPARISON:  None. FINDINGS: Brain: Ventricles are within normal limits in size and configuration. All areas of the brain demonstrate normal gray-white matter attenuation. There is no mass, hemorrhage, edema or other evidence of acute parenchymal abnormality. No extra-axial hemorrhage. Vascular: No hyperdense vessel or unexpected calcification. There are chronic calcified atherosclerotic changes of the large vessels at the skull base. Skull: Negative for fracture or focal lesion. Sinuses/Orbits: No acute findings. Other: None. IMPRESSION: Negative head CT.  No intracranial mass, hemorrhage or edema. Electronically Signed   By: Franki Cabot M.D.   On: 05/03/2016 18:01  Dg Foot Complete Right  Result Date: 05/03/2016 CLINICAL  DATA:  Recent fall, subsequent encounter EXAM: RIGHT FOOT COMPLETE - 3+ VIEW COMPARISON:  05/01/2016 FINDINGS: No acute fracture or dislocation is noted. No soft tissue changes are seen. Small calcaneal spur is noted. IMPRESSION: No acute abnormality noted. No significant interval change from the study of 2 days previous. Electronically Signed   By: Inez Catalina M.D.   On: 05/03/2016 17:48  Dg Hip Unilat W Or Wo Pelvis 2-3 Views Right  Result Date: 05/03/2016 CLINICAL DATA:  Fall 2 days ago with right hip pain, initial encounter EXAM: DG HIP (WITH OR WITHOUT PELVIS) 2-3V RIGHT COMPARISON:  None. FINDINGS: Pelvic ring is intact. Postsurgical changes are noted in the lower lumbar spine. No acute fracture or dislocation is noted. Mild degenerative changes of the hip joints are seen bilaterally. Diffuse vascular calcifications are seen. IMPRESSION: No acute abnormality noted. Electronically Signed   By: Inez Catalina M.D.   On: 05/03/2016 17:47   Time Spent in minutes  30   Jani Gravel M.D on 05/04/2016 at 5:17 AM  Between 7am to 7pm - Pager - [phone]  After 7pm go to [email or web address] - password [facility]  [facility] -  Office  [phone]

## 2016-05-04 NOTE — Plan of Care (Signed)
Problem: Fluid Volume: Goal: Fluid volume balance will be maintained or improved Outcome: Progressing NS at 100 ml/hr

## 2016-05-05 ENCOUNTER — Inpatient Hospital Stay (HOSPITAL_COMMUNITY): Payer: Medicare Other

## 2016-05-05 DIAGNOSIS — E43 Unspecified severe protein-calorie malnutrition: Secondary | ICD-10-CM

## 2016-05-05 DIAGNOSIS — R251 Tremor, unspecified: Secondary | ICD-10-CM

## 2016-05-05 LAB — COMPREHENSIVE METABOLIC PANEL
ALBUMIN: 1.7 g/dL — AB (ref 3.5–5.0)
ALT: 31 U/L (ref 14–54)
ANION GAP: 6 (ref 5–15)
AST: 49 U/L — ABNORMAL HIGH (ref 15–41)
Alkaline Phosphatase: 89 U/L (ref 38–126)
BUN: 27 mg/dL — ABNORMAL HIGH (ref 6–20)
CALCIUM: 7.2 mg/dL — AB (ref 8.9–10.3)
CO2: 18 mmol/L — AB (ref 22–32)
Chloride: 108 mmol/L (ref 101–111)
Creatinine, Ser: 1.81 mg/dL — ABNORMAL HIGH (ref 0.44–1.00)
GFR calc non Af Amer: 28 mL/min — ABNORMAL LOW (ref >60)
GFR, EST AFRICAN AMERICAN: 32 mL/min — AB (ref >60)
GLUCOSE: 95 mg/dL (ref 65–99)
POTASSIUM: 3.8 mmol/L (ref 3.5–5.1)
SODIUM: 132 mmol/L — AB (ref 135–145)
TOTAL PROTEIN: 3.4 g/dL — AB (ref 6.5–8.1)
Total Bilirubin: 0.6 mg/dL (ref 0.3–1.2)

## 2016-05-05 LAB — GLUCOSE, CAPILLARY
GLUCOSE-CAPILLARY: 127 mg/dL — AB (ref 65–99)
GLUCOSE-CAPILLARY: 196 mg/dL — AB (ref 65–99)
GLUCOSE-CAPILLARY: 127 mg/dL — AB (ref 65–99)
Glucose-Capillary: 91 mg/dL (ref 65–99)

## 2016-05-05 LAB — RHEUMATOID FACTOR: Rhuematoid fact SerPl-aCnc: <10 IU/mL (ref 0.0–13.9)

## 2016-05-05 MED ORDER — SODIUM CHLORIDE 0.9 % IV SOLN
INTRAVENOUS | Status: AC
  Administered 2016-05-05 – 2016-05-06 (×3): via INTRAVENOUS

## 2016-05-05 MED ORDER — GABAPENTIN 100 MG PO CAPS
200.0000 mg | ORAL_CAPSULE | Freq: Three times a day (TID) | ORAL | Status: DC
  Administered 2016-05-05 – 2016-05-07 (×5): 200 mg via ORAL
  Filled 2016-05-05 (×6): qty 2

## 2016-05-05 MED ORDER — PRO-STAT SUGAR FREE PO LIQD
30.0000 mL | Freq: Two times a day (BID) | ORAL | Status: DC
  Administered 2016-05-05 – 2016-05-07 (×5): 30 mL via ORAL
  Filled 2016-05-05 (×5): qty 30

## 2016-05-05 NOTE — Progress Notes (Addendum)
Patient ID: [name], female   DOB: 01-06-1951, 65 y.o.   MRN: [id number]                                                                PROGRESS NOTE                                                                                                                                                                                                             Patient Demographics:    [name], is a 65 y.o. female, DOB - 1950-10-23, [id number]  Admit date - 05/03/2016   Admitting Physician [name], MD  Outpatient Primary MD for the patient is Joya Gaskins, MD  LOS - 2  Outpatient Specialists:   Chief Complaint  Patient presents with  . Fall       Brief Narrative  65 y.o.femalewith medical history significant of anemia, cataracts, chronic back pain, fibromyalgia, fusion of the lumbar spine, depression, GERD, hemorrhoids, colon polyps, urolithiasis, hypertension, type 2 diabetes, hypothyroidism, idiopathic thrombocytopenia who was brought to the emergency department via rocking and EMS reporting tremors and frequent falls recently, including a fall yesterday at around 1500 with inability to get up and having to lie down on the floor all night.   Per patient, she has been feeling weak for the past 2-3 weeks. She has noticed also the development of tremors during thisperiod. She states that she thought it was her high dose prednisone (40 mg by mouth daily) and discontinued it, but this has not affected her symptoms in any way. She states that over the past few days, she has been increasingly weak, has fallen about 4 times, including this last fall at home in her walking closet. She is not sure if she was lying down on the floor for 1 or 2 days, but she thinks that it happened yesterday. She denies fever, chills, headache, sore throat, productive cough, diarrhea, dysuria or hematuria. She denies chest pain, palpitations, diaphoresis, nausea, PND, orthopnea or pitting edema  of the lower extremities. She sometimes gets mildly dyspneic and lightheaded.   ED Course:Workup in the ER shows hyperkalemia, mild anemia, AKI,EKG was sinus tachycardia. The patient receivedIV fluids, oral Kayexalate, albuterol nebulized, IV dextrose and insulin for hyperkalemia treatment. She states that she feels a little  better.   Subjective:    [name] today still feels weak,  Has bilateral hand tremor, pt had urinary retention yesterday and had to have straight cath x2.   No headache, No chest pain, No abdominal pain - No Nausea, No new weakness tingling or numbness, No Cough - SOB.    Assessment  & Plan :    Principal Problem:   AKI (acute kidney injury) ([place]) Active Problems:   Spondylolisthesis of lumbar region   Hyperkalemia   Hypothyroidism   Hypertension   Rhabdomyolysis   Type 2 diabetes mellitus (HCC)   Anemia   GERD (gastroesophageal reflux disease)   Fibromyalgia   Hyponatremia   Weakness   Tremor Pt would like evaluation by neurology  Urinary retention If has further issues we will consider urology consultation  AKI (acute kidney injury) ([place]) slowly resolving Holding valsartan, metformin and meloxicam. Check abdominal ultrasound Cont ns iv Check cmp in am   Hyperkalemia resolved Check cmp in am  Hyponatremia Cont ns iv  Rhabdomyolysis Cont ns iv  Weakness PT and OT   Spondylolisthesis of lumbar region Decrease gabapentin 200 mg by mouth 3 times a day. Due to renal insufficiency Continue Flexeril 10 mg by mouth 3 times a day as needed. Continue oxycodone 20 mg by mouth 3 times a day as needed.  Fibromyalgia Decrease gabapentin due to renal insufficiency Continue treatment as above.  Hypothyroidism Continue levothyroxine 25 g daily..   Hypertension Hold ARB due to AKI. Monitor blood pressure closely. When necessary antihypertensives  Type 2 diabetes mellitus (HCC) Carbohydrate modified diet. Hold  metformin due to elevated creatinine. CBG monitoring with regular insulin sliding scale.  Anemia Check anemia profile and monitor H&H.  GERD (gastroesophageal reflux disease) Continue daily proton pump inhibitor. Pantoprazole 40 mg by mouth daily while in the hospital  Protein calorie malnutrition severe Start pro stat  Code Status : FULL CODE  Family Communication  :  sister  Disposition Plan  :   Barriers For Discharge :   Consults  :  neurology  Procedures  :   DVT Prophylaxis  :  Lovenox -  SCDs   Lab Results  Component Value Date   PLT 190 05/04/2016    Antibiotics  :   Anti-infectives    None        Objective:   Vitals:   05/04/16 1706 05/04/16 2040 05/05/16 0532 05/05/16 1019  BP: (!) 100/43 (!) 100/42 (!) 113/47 (!) 111/59  Pulse: (!) 102 (!) 113 (!) 102 (!) 109  Resp: 18 19 20 18   Temp: 97.9 F (36.6 C) 98.2 F (36.8 C) 97.8 F (36.6 C) 97.8 F (36.6 C)  TempSrc: Oral Oral Oral Oral  SpO2: 96% 98% 94% 94%  Weight:  82.3 kg (181 lb 7 oz)    Height:        Wt Readings from Last 3 Encounters:  05/04/16 82.3 kg (181 lb 7 oz)  02/16/16 86 kg (189 lb 8 oz)  11/09/15 87.5 kg (193 lb)     Intake/Output Summary (Last 24 hours) at 05/05/16 1051 Last data filed at 05/05/16 0900  Gross per 24 hour  Intake          2303.33 ml  Output             1675 ml  Net           628.33 ml     Physical Exam  Awake Alert, Oriented X 3, No new F.N  deficits, Normal affect [place].AT,PERRAL Supple Neck,No JVD, No cervical lymphadenopathy appriciated.  Symmetrical Chest wall movement, Good air movement bilaterally, CTAB RRR,No Gallops,Rubs or new Murmurs, No Parasternal Heave +ve B.Sounds, Abd Soft, No tenderness, No organomegaly appriciated, No rebound - guarding or rigidity. No Cyanosis, Clubbing or edema, No new Rash or bruise      Data Review:    CBC  Recent Labs Lab 05/03/16 1352 05/04/16 0658  WBC 10.1 7.4  HGB 11.5* 9.5*  HCT 35.9* 29.0*   PLT 240 190  MCV 91.8 90.1  MCH 29.4 29.5  MCHC 32.0 32.8  RDW 15.6* 15.7*  LYMPHSABS  --  1.4  MONOABS  --  1.1*  EOSABS  --  0.0  BASOSABS  --  0.0    Chemistries   Recent Labs Lab 05/03/16 1352 05/03/16 1645 05/03/16 1948 05/04/16 0658 05/04/16 1344 05/05/16 0536  NA 129*  --   --  134* 134* 132*  K 6.0* 6.8*  --  5.5* 4.6 3.8  CL 101  --   --  108 109 108  CO2 18*  --   --  20* 18* 18*  GLUCOSE 146*  --   --  100* 109* 95  BUN 38*  --   --  36* 35* 27*  CREATININE 2.78*  --   --  2.20* 2.14* 1.81*  CALCIUM 8.3*  --   --  7.8* 7.8* 7.2*  MG  --   --  1.7  --   --   --   AST 29  --   --  25  --  49*  ALT 31  --   --  25  --  31  ALKPHOS 109  --   --  98  --  89  BILITOT 0.5  --   --  0.4  --  0.6   ------------------------------------------------------------------------------------------------------------------ No results for input(s): CHOL, HDL, LDLCALC, TRIG, CHOLHDL, LDLDIRECT in the last 72 hours.  No results found for: HGBA1C ------------------------------------------------------------------------------------------------------------------  Recent Labs  05/03/16 2219  TSH 2.344   ------------------------------------------------------------------------------------------------------------------  Recent Labs  05/03/16 1948  VITAMINB12 709  FOLATE 27.7  FERRITIN 48  TIBC 195*  IRON 21*  RETICCTPCT 1.7    Coagulation profile No results for input(s): INR, PROTIME in the last 168 hours.  No results for input(s): DDIMER in the last 72 hours.  Cardiac Enzymes No results for input(s): CKMB, TROPONINI, MYOGLOBIN in the last 168 hours.  Invalid input(s): CK ------------------------------------------------------------------------------------------------------------------ No results found for: BNP  Inpatient Medications  Scheduled Meds: . enoxaparin (LOVENOX) injection  30 mg Subcutaneous Q24H  . estradiol  1 mg Oral Daily  . feeding supplement  (PRO-STAT SUGAR FREE 64)  30 mL Oral BID  . gabapentin  200 mg Oral TID  . insulin aspart  0-9 Units Subcutaneous TID WC  . levothyroxine  25 mcg Oral QAC breakfast  . LORazepam  1 mg Intravenous Once  . OXcarbazepine  150 mg Oral QID  . pantoprazole  40 mg Oral Daily  . sodium chloride flush  3 mL Intravenous Q12H   Continuous Infusions: . sodium chloride 100 mL/hr at 05/05/16 1010   PRN Meds:.cyclobenzaprine, ondansetron **OR** ondansetron (ZOFRAN) IV, oxyCODONE, promethazine, temazepam, triamcinolone  Micro Results Recent Results (from the past 240 hour(s))  Urine culture     Status: Abnormal   Collection Time: 05/03/16  5:12 PM  Result Value Ref Range Status   Specimen Description URINE, RANDOM  Final   Special Requests  NONE  Final   Culture MULTIPLE SPECIES PRESENT, SUGGEST RECOLLECTION (A)  Final   Report Status 05/04/2016 FINAL  Final    Radiology Reports Dg Chest 2 View  Result Date: 05/03/2016 CLINICAL DATA:  Fall 2 days ago with shortness of breath, initial encounter EXAM: CHEST  2 VIEW COMPARISON:  12/28/2010 FINDINGS: Cardiac shadow is within normal limits. The lungs are well aerated bilaterally. No focal infiltrate or sizable effusion is seen. Postsurgical changes in the cervical spine are noted. No acute bony abnormality is seen. IMPRESSION: No active cardiopulmonary disease. Electronically Signed   By: Inez Catalina M.D.   On: 05/03/2016 17:46  Dg Shoulder Right  Result Date: 05/03/2016 CLINICAL DATA:  Fall 2 days ago with persistent right shoulder pain, initial encounter EXAM: RIGHT SHOULDER - 2+ VIEW COMPARISON:  None. FINDINGS: Acromioclavicular degenerative changes are noted. No fracture or dislocation is seen. The underlying bony thorax is within normal limits. Postsurgical changes are noted in the cervical spine. IMPRESSION: No acute abnormality noted. Electronically Signed   By: Inez Catalina M.D.   On: 05/03/2016 17:50  Ct Head Wo Contrast  Result Date:  05/03/2016 CLINICAL DATA:  Fall, weakness. EXAM: CT HEAD WITHOUT CONTRAST TECHNIQUE: Contiguous axial images were obtained from the base of the skull through the vertex without intravenous contrast. COMPARISON:  None. FINDINGS: Brain: Ventricles are within normal limits in size and configuration. All areas of the brain demonstrate normal gray-white matter attenuation. There is no mass, hemorrhage, edema or other evidence of acute parenchymal abnormality. No extra-axial hemorrhage. Vascular: No hyperdense vessel or unexpected calcification. There are chronic calcified atherosclerotic changes of the large vessels at the skull base. Skull: Negative for fracture or focal lesion. Sinuses/Orbits: No acute findings. Other: None. IMPRESSION: Negative head CT.  No intracranial mass, hemorrhage or edema. Electronically Signed   By: Franki Cabot M.D.   On: 05/03/2016 18:01  Dg Foot Complete Right  Result Date: 05/03/2016 CLINICAL DATA:  Recent fall, subsequent encounter EXAM: RIGHT FOOT COMPLETE - 3+ VIEW COMPARISON:  05/01/2016 FINDINGS: No acute fracture or dislocation is noted. No soft tissue changes are seen. Small calcaneal spur is noted. IMPRESSION: No acute abnormality noted. No significant interval change from the study of 2 days previous. Electronically Signed   By: Inez Catalina M.D.   On: 05/03/2016 17:48  Dg Hip Unilat W Or Wo Pelvis 2-3 Views Right  Result Date: 05/03/2016 CLINICAL DATA:  Fall 2 days ago with right hip pain, initial encounter EXAM: DG HIP (WITH OR WITHOUT PELVIS) 2-3V RIGHT COMPARISON:  None. FINDINGS: Pelvic ring is intact. Postsurgical changes are noted in the lower lumbar spine. No acute fracture or dislocation is noted. Mild degenerative changes of the hip joints are seen bilaterally. Diffuse vascular calcifications are seen. IMPRESSION: No acute abnormality noted. Electronically Signed   By: Inez Catalina M.D.   On: 05/03/2016 17:47   Time Spent in minutes  30   Jani Gravel M.D on  05/05/2016 at 10:51 AM  Between 7am to 7pm - Pager - [phone]  After 7pm go to [email or web address] - password [facility]  [facility] -  Office  [phone]

## 2016-05-05 NOTE — Consult Note (Signed)
Initial Neurological Consultation                      NEURO HOSPITALIST CONSULT NOTE   Requestig physician:   Dr. Maudie Mercury  Reason for Consult:  Tremor   HPI:                                                                                                                                          [name] is an 65 y.o. female who was admitted to the hospital with a complaint of frequent falls. She reports falling on the night prior to admission and being unable to get up until the next morning when she was able to call the paramedics for assistance.  [name] is presently complaining of a tremor involving the upper extremities. She reports the tremor has been present for approximately 3 weeks. [name] reports that prior to this she had been diagnosed with lichen planus. She notes being started on a dose of prednisone 40 mg. Although the details are a bit unclear but seems that she may have abruptly discontinued that medication several days ago.  [name] reports that prior to 3 weeks ago she had not appreciated any tremor. [name] reports difficulty with gait due to back pain. She also has a known history of fibromyalgia and spondylolisthesis in the lumbar region.  During the initial history [name] was not noted to have any evidence of tremor in the upper extremities. Upon performing the physical exam and intermittent tremor that could be extinguished with distraction techniques was noted predominantly in the left upper extremity. [name] was able to stand and will take several steps. She has no facial asymmetry she reports no cranial nerve symptoms and there are no focal neurological deficits.  [name] was noted to have several electrolyte abnormalities and she has a known history of hypothyroidism as well. The CK was modestly elevated possibly related to her being immobile overnight on the floor.     Past Medical History:  Diagnosis Date  . Anemia   . Cataracts, bilateral   . Chronic back  pain    scoliosis/displacement of lumbosacral intervertebral disc/stenosis  . Depression    but doens't take any meds  . Diabetes mellitus without complication ([place])    takes Metformin daily  . Family history of adverse reaction to anesthesia    pts mom would get short of breath after anesthesia but was a smoker  . Fibromyalgia    takes Gabapentin daily  . Fusion of lumbar spine   . GERD (gastroesophageal reflux disease)    takes Nexium daily  . Heart murmur   . Hemorrhoids   . History of blood transfusion 2013   no abnormal reaction noted  . History of bronchitis 2009  . History of colon polyps    benign  . History of hiatal hernia   . History of kidney  stones    has a kidney stone now  . History of migraine    hasn't had one in over a yr  . History of surgery on arm    plates and screws  . Hypertension   . Hypothyroidism    takes Synthroid daily  . Idiopathic thrombocytopenia ([place])   . Insomnia    takes Restoril nightly as needed   . Memory loss   . Muscle spasm    takes Flexeril daily as needed  . Numbness and tingling    both toes  . PONV (postoperative nausea and vomiting)   . Shortness of breath dyspnea    with exertion but states its bc she can't exercise  . Urinary frequency   . Vitamin D deficiency     Past Surgical History:  Procedure Laterality Date  . ABDOMINAL HYSTERECTOMY    . CARDIAC CATHETERIZATION  63yrs ago  . CATARACT EXTRACTION, BILATERAL    . CERVICAL FUSION    . COLONOSCOPY    . ESOPHAGOGASTRODUODENOSCOPY    . GASTRIC BYPASS    . JOINT REPLACEMENT Bilateral   . kidney stone removed    . KNEE ARTHROSCOPY     total of 7 between both knees  . left arm surgery     pins  . LUMBAR FUSION    . TONSILLECTOMY    . tummy tuck       MEDICATIONS:                                                                                                                     I have reviewed the patient's current medications.  Allergies  Allergen  Reactions  . Formaldehyde     Other reaction(s): Shortness Of Breath, coughing  . Altace [Ramipril] Cough  . Dilaudid [Hydromorphone] Itching    Drip OK, pills cause itching     Social History:  reports that she has never smoked. She has never used smokeless tobacco. She reports that she does not drink alcohol or use drugs.  Family History  Problem Relation Age of Onset  . Lung cancer Mother   . COPD Mother   . Hypertension Mother   . Depression Mother   . Lymphoma Paternal Uncle   . Diabetes Maternal Grandmother     x 5 uncles  . Heart disease      Maternal and Paternal     ROS:  History obtained from chart review  General ROS: negative for - chills, fatigue, fever, night sweats, weight gain or weight loss Psychological ROS: negative for - behavioral disorder, hallucinations, memory difficulties, mood swings or suicidal ideation Ophthalmic ROS: negative for - blurry vision, double vision, eye pain or loss of vision ENT ROS: negative for - epistaxis, nasal discharge, oral lesions, sore throat, tinnitus or vertigo Allergy and Immunology ROS: negative for - hives or itchy/watery eyes Hematological and Lymphatic ROS: negative for - bleeding problems, bruising or swollen lymph nodes Endocrine ROS: negative for - galactorrhea, hair pattern changes, polydipsia/polyuria or temperature intolerance Respiratory ROS: negative for - cough, hemoptysis, shortness of breath or wheezing Cardiovascular ROS: negative for - chest pain, dyspnea on exertion, edema or irregular heartbeat Gastrointestinal ROS: negative for - abdominal pain, diarrhea, hematemesis, nausea/vomiting or stool incontinence Genito-Urinary ROS: negative for - dysuria, hematuria, incontinence or urinary frequency/urgency Musculoskeletal ROS: negative for - joint swelling or muscular  weakness Neurological ROS: as noted in HPI Dermatological ROS: negative for rash and skin lesion changes   General Exam                                                                                                      Blood pressure (!) 111/59, pulse (!) 109, temperature 97.8 F (36.6 C), temperature source Oral, resp. rate 18, height 5\' 2"  (1.575 m), weight 82.3 kg (181 lb 7 oz), SpO2 94 %. HEENT-  Normocephalic, no lesions, without obvious abnormality.  Normal external eye and conjunctiva.  Normal TM's bilaterally.  Normal auditory canals and external ears. Normal external nose, mucus membranes and septum.  Normal pharynx. Cardiovascular- regular rate and rhythm, S1, S2 normal, no murmur, click, rub or gallop, pulses palpable throughout   Lungs- chest clear, no wheezing, rales, normal symmetric air entry, Heart exam - S1, S2 normal, no murmur, no gallop, rate regular Abdomen- soft, non-tender; bowel sounds normal; no masses,  no organomegaly Extremities- less then 2 second capillary refill Lymph-no adenopathy palpable Musculoskeletal-no joint tenderness, deformity or swelling Skin-warm and dry, no hyperpigmentation, vitiligo, or suspicious lesions  Neurological Examination Mental Status: Alert, oriented, thought content appropriate.  Speech fluent without evidence of aphasia.  Able to follow 3 step commands without difficulty. Cranial Nerves: II: Discs flat bilaterally; Visual fields grossly normal, pupils equal, round, reactive to light and accommodation III,IV, VI: ptosis not present, extra-ocular motions intact bilaterally V,VII: smile symmetric, facial light touch sensation normal bilaterally VIII: hearing normal bilaterally IX,X: uvula rises symmetrically XI: bilateral shoulder shrug XII: midline tongue extension Motor: Right : Upper extremity   5/5    Left:     Upper extremity   5/5  Lower extremity   5/5     Lower extremity   5/5 Tone and bulk:normal tone throughout; no  atrophy noted Sensory: Pinprick and light touch intact throughout, bilaterally Deep Tendon Reflexes: 2+ and symmetric throughout Plantars: Right: downgoing   Left: downgoing Cerebellar: And intermittent tremor was noted. The tremor was not present when the history was being obtained. Once Debra sat on the edge of the  bed she began to shake her left arm. With distraction techniques the tremor abated. Gait: Unsteady and antalgic      Lab Results: Basic Metabolic Panel:  Recent Labs Lab 05/03/16 1352 05/03/16 1645 05/03/16 1948 05/04/16 0658 05/04/16 1344 05/05/16 0536  NA 129*  --   --  134* 134* 132*  K 6.0* 6.8*  --  5.5* 4.6 3.8  CL 101  --   --  108 109 108  CO2 18*  --   --  20* 18* 18*  GLUCOSE 146*  --   --  100* 109* 95  BUN 38*  --   --  36* 35* 27*  CREATININE 2.78*  --   --  2.20* 2.14* 1.81*  CALCIUM 8.3*  --   --  7.8* 7.8* 7.2*  MG  --   --  1.7  --   --   --   PHOS  --   --  5.0*  --   --   --     Liver Function Tests:  Recent Labs Lab 05/03/16 1352 05/04/16 0658 05/05/16 0536  AST 29 25 49*  ALT 31 25 31   ALKPHOS 109 98 89  BILITOT 0.5 0.4 0.6  PROT 4.9* 4.0* 3.4*  ALBUMIN 2.5* 2.0* 1.7*    Recent Labs Lab 05/03/16 1352  LIPASE 11   No results for input(s): AMMONIA in the last 168 hours.  CBC:  Recent Labs Lab 05/03/16 1352 05/04/16 0658  WBC 10.1 7.4  NEUTROABS  --  4.8  HGB 11.5* 9.5*  HCT 35.9* 29.0*  MCV 91.8 90.1  PLT 240 190    Cardiac Enzymes:  Recent Labs Lab 05/03/16 1352  CKTOTAL 545*    Lipid Panel: No results for input(s): CHOL, TRIG, HDL, CHOLHDL, VLDL, LDLCALC in the last 168 hours.  CBG:  Recent Labs Lab 05/04/16 1056 05/04/16 1704 05/04/16 2021 05/05/16 0754 05/05/16 1158  GLUCAP 153* 93 116* 91 127*    Microbiology: Results for orders placed or performed during the hospital encounter of 05/03/16  Urine culture     Status: Abnormal   Collection Time: 05/03/16  5:12 PM  Result Value Ref  Range Status   Specimen Description URINE, RANDOM  Final   Special Requests NONE  Final   Culture MULTIPLE SPECIES PRESENT, SUGGEST RECOLLECTION (A)  Final   Report Status 05/04/2016 FINAL  Final    Coagulation Studies: No results for input(s): LABPROT, INR in the last 72 hours.  Imaging: Dg Chest 2 View  Result Date: 05/03/2016 CLINICAL DATA:  Fall 2 days ago with shortness of breath, initial encounter EXAM: CHEST  2 VIEW COMPARISON:  12/28/2010 FINDINGS: Cardiac shadow is within normal limits. The lungs are well aerated bilaterally. No focal infiltrate or sizable effusion is seen. Postsurgical changes in the cervical spine are noted. No acute bony abnormality is seen. IMPRESSION: No active cardiopulmonary disease. Electronically Signed   By: Inez Catalina M.D.   On: 05/03/2016 17:46  Dg Shoulder Right  Result Date: 05/03/2016 CLINICAL DATA:  Fall 2 days ago with persistent right shoulder pain, initial encounter EXAM: RIGHT SHOULDER - 2+ VIEW COMPARISON:  None. FINDINGS: Acromioclavicular degenerative changes are noted. No fracture or dislocation is seen. The underlying bony thorax is within normal limits. Postsurgical changes are noted in the cervical spine. IMPRESSION: No acute abnormality noted. Electronically Signed   By: Inez Catalina M.D.   On: 05/03/2016 17:50  Ct Head Wo Contrast  Result Date: 05/03/2016 CLINICAL DATA:  Fall, weakness. EXAM: CT HEAD WITHOUT CONTRAST TECHNIQUE: Contiguous axial images were obtained from the base of the skull through the vertex without intravenous contrast. COMPARISON:  None. FINDINGS: Brain: Ventricles are within normal limits in size and configuration. All areas of the brain demonstrate normal gray-white matter attenuation. There is no mass, hemorrhage, edema or other evidence of acute parenchymal abnormality. No extra-axial hemorrhage. Vascular: No hyperdense vessel or unexpected calcification. There are chronic calcified atherosclerotic changes of the  large vessels at the skull base. Skull: Negative for fracture or focal lesion. Sinuses/Orbits: No acute findings. Other: None. IMPRESSION: Negative head CT.  No intracranial mass, hemorrhage or edema. Electronically Signed   By: Franki Cabot M.D.   On: 05/03/2016 18:01  Dg Foot Complete Right  Result Date: 05/03/2016 CLINICAL DATA:  Recent fall, subsequent encounter EXAM: RIGHT FOOT COMPLETE - 3+ VIEW COMPARISON:  05/01/2016 FINDINGS: No acute fracture or dislocation is noted. No soft tissue changes are seen. Small calcaneal spur is noted. IMPRESSION: No acute abnormality noted. No significant interval change from the study of 2 days previous. Electronically Signed   By: Inez Catalina M.D.   On: 05/03/2016 17:48  Dg Hip Unilat W Or Wo Pelvis 2-3 Views Right  Result Date: 05/03/2016 CLINICAL DATA:  Fall 2 days ago with right hip pain, initial encounter EXAM: DG HIP (WITH OR WITHOUT PELVIS) 2-3V RIGHT COMPARISON:  None. FINDINGS: Pelvic ring is intact. Postsurgical changes are noted in the lower lumbar spine. No acute fracture or dislocation is noted. Mild degenerative changes of the hip joints are seen bilaterally. Diffuse vascular calcifications are seen. IMPRESSION: No acute abnormality noted. Electronically Signed   By: Inez Catalina M.D.   On: 05/03/2016 17:47   Assessment/Plan:  Tajia is a pleasant 65 year old patient who presents after a fall at home. This is likely due to her history of back pain. She reports that she has been losing her balance more easily. The falls appear to be related to her back pain and fibromyalgia.  Her main complaint at this time is a tremor in the upper extremities. She reports this is been present for 3 weeks. It is unclear what the relation of the tremor is to the abrupt discontinuation of her steroids or her anemia. It was also noted that there was a functional component to the tremor as well.  The tremor that was noted on exam appears most consistent with  enhanced physiological tremor. It did not have the typical characteristics associated with Parkinson's disease, the multiple system atrophies, or essential tremor.  Enhanced physiological tremor can be seen in the setting of electrolyte abnormalities, pain, and other medical conditions especially stress. When Ellinor requested information as to what might be causing her tremor it was explained that this likely represents an enhanced physiological tremor. 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  Mikeisha was also noted to have hyponatremia. One of the side effects of Trileptal is the potential for hyponatremia however, this is seen less often than it is with Tegretol.  Suyapa has mildly elevated muscle enzymes. This is likely related to a period of immobility. This should respond to increased oral intake of liquids.  Plan:  1. Physical therapy to assist with gait  2. The medicine team is already addressing the elevated muscle enzymes and electrolytes anomalies.  3. Careful observation for the possibility of hypoadrenalism related to  the abrupt discontinuation of steroids.  4.  We will sign off at this point. Please contact us with any new questions or issues.  Thank you for consulting the neurology team to assist in the care of your patient!   James A. Tasia Catchings, M.D. Neurohospitalist Phone: [phone]  05/05/2016, 12:56 PM

## 2016-05-05 NOTE — Progress Notes (Signed)
Patient has not voided in over 9 hours. Pt bladder scanned and retaining 947. On call notified. Awaiting orders.  Ermalinda Memos, RN

## 2016-05-05 NOTE — Progress Notes (Signed)
Patient continue to drop things from her hand.  Seen her do this twice this shift. Patient continue to be sleepy  and slept most of the day per day shift RN.

## 2016-05-06 DIAGNOSIS — E871 Hypo-osmolality and hyponatremia: Secondary | ICD-10-CM

## 2016-05-06 LAB — COMPREHENSIVE METABOLIC PANEL
ALBUMIN: 1.7 g/dL — AB (ref 3.5–5.0)
ALK PHOS: 92 U/L (ref 38–126)
ALT: 34 U/L (ref 14–54)
ANION GAP: 6 (ref 5–15)
AST: 35 U/L (ref 15–41)
BILIRUBIN TOTAL: 0.4 mg/dL (ref 0.3–1.2)
BUN: 25 mg/dL — AB (ref 6–20)
CALCIUM: 7.6 mg/dL — AB (ref 8.9–10.3)
CO2: 18 mmol/L — AB (ref 22–32)
Chloride: 112 mmol/L — ABNORMAL HIGH (ref 101–111)
Creatinine, Ser: 1.29 mg/dL — ABNORMAL HIGH (ref 0.44–1.00)
GFR calc Af Amer: 49 mL/min — ABNORMAL LOW (ref >60)
GFR calc non Af Amer: 42 mL/min — ABNORMAL LOW (ref >60)
GLUCOSE: 105 mg/dL — AB (ref 65–99)
Potassium: 3.8 mmol/L (ref 3.5–5.1)
SODIUM: 136 mmol/L (ref 135–145)
TOTAL PROTEIN: 3.4 g/dL — AB (ref 6.5–8.1)

## 2016-05-06 LAB — CBC
HEMATOCRIT: 32 % — AB (ref 36.0–46.0)
HEMOGLOBIN: 10 g/dL — AB (ref 12.0–15.0)
MCH: 28.8 pg (ref 26.0–34.0)
MCHC: 31.3 g/dL (ref 30.0–36.0)
MCV: 92.2 fL (ref 78.0–100.0)
Platelets: 160 10*3/uL (ref 150–400)
RBC: 3.47 MIL/uL — AB (ref 3.87–5.11)
RDW: 15.7 % — ABNORMAL HIGH (ref 11.5–15.5)
WBC: 4.5 10*3/uL (ref 4.0–10.5)

## 2016-05-06 LAB — GLUCOSE, CAPILLARY
GLUCOSE-CAPILLARY: 123 mg/dL — AB (ref 65–99)
GLUCOSE-CAPILLARY: 164 mg/dL — AB (ref 65–99)
Glucose-Capillary: 115 mg/dL — ABNORMAL HIGH (ref 65–99)
Glucose-Capillary: 118 mg/dL — ABNORMAL HIGH (ref 65–99)

## 2016-05-06 LAB — CK: Total CK: 385 U/L — ABNORMAL HIGH (ref 38–234)

## 2016-05-06 MED ORDER — LORATADINE 10 MG PO TABS
10.0000 mg | ORAL_TABLET | Freq: Every day | ORAL | Status: DC | PRN
  Administered 2016-05-06: 10 mg via ORAL
  Filled 2016-05-06: qty 1

## 2016-05-06 MED ORDER — ENOXAPARIN SODIUM 40 MG/0.4ML ~~LOC~~ SOLN
40.0000 mg | SUBCUTANEOUS | Status: DC
  Administered 2016-05-06: 40 mg via SUBCUTANEOUS
  Filled 2016-05-06: qty 0.4

## 2016-05-06 MED ORDER — OXCARBAZEPINE 150 MG PO TABS
150.0000 mg | ORAL_TABLET | Freq: Two times a day (BID) | ORAL | Status: DC
  Administered 2016-05-06 – 2016-05-07 (×2): 150 mg via ORAL
  Filled 2016-05-06 (×3): qty 1

## 2016-05-06 NOTE — Progress Notes (Addendum)
Patient ID: [name], female   DOB: October 24, 1950, 65 y.o.   MRN: [id number]                                                                PROGRESS NOTE                                                                                                                                                                                                             Patient Demographics:    [name], is a 65 y.o. female, DOB - 08/03/1951, [id number]  Admit date - 05/03/2016   Admitting Physician [name], MD  Outpatient Primary MD for the patient is Joya Gaskins, MD  LOS - 3  Outpatient Specialists:   Chief Complaint  Patient presents with  . Fall       Brief Narrative  65 y.o.femalewith medical history significant of anemia, cataracts, chronic back pain, fibromyalgia, fusion of the lumbar spine, depression, GERD, hemorrhoids, colon polyps, urolithiasis, hypertension, type 2 diabetes, hypothyroidism, idiopathic thrombocytopenia who was brought to the emergency department via rocking and EMS reporting tremors and frequent falls recently, including a fall yesterday at around 1500 with inability to get up and having to lie down on the floor all night.   Per patient, she has been feeling weak for the past 2-3 weeks. She has noticed also the development of tremors during thisperiod. She states that she thought it was her high dose prednisone (40 mg by mouth daily) and discontinued it, but this has not affected her symptoms in any way. She states that over the past few days, she has been increasingly weak, has fallen about 4 times, including this last fall at home in her walking closet. She is not sure if she was lying down on the floor for 1 or 2 days, but she thinks that it happened yesterday. She denies fever, chills, headache, sore throat, productive cough, diarrhea, dysuria or hematuria. She denies chest pain, palpitations, diaphoresis, nausea, PND, orthopnea or pitting edema  of the lower extremities. She sometimes gets mildly dyspneic and lightheaded.   ED Course:Workup in the ER shows hyperkalemia, mild anemia, AKI,EKG was sinus tachycardia. The patient receivedIV fluids, oral Kayexalate, albuterol nebulized, IV dextrose and insulin for hyperkalemia treatment. She states that she feels a little  better.  Hospital Course:  Pt tx with ns iv and kayexalate for hyperkalemia, and ARF. Mild rhabdo improving with iv hydration. Pt was seen by neurology for tremor and thought to be related to electrolytes. Pt on trileptal for FM.  Will d/c as this can contribute to hyponatremia.  Cortisol level pending.    Subjective:    [name] today still c/o tremor, seen by neurology, tremor stable, thought to be related to electrolytes.   No headache, No chest pain, No abdominal pain - No Nausea, No new weakness tingling or numbness, No Cough - SOB.    Assessment  & Plan :    Principal Problem:   AKI (acute kidney injury) ([place]) Active Problems:   Spondylolisthesis of lumbar region   Hyperkalemia   Hypothyroidism   Hypertension   Rhabdomyolysis   Type 2 diabetes mellitus (HCC)   Anemia   GERD (gastroesophageal reflux disease)   Fibromyalgia   Hyponatremia   Weakness   Protein-calorie malnutrition, severe (HCC)   Tremor ? Secondary to electrolyte abnormalities Appreciate neurology input  Urinary retention resolved per pt  AKI (acute kidney injury) ([place]) slowly resolving Holdingvalsartan, metformin and meloxicam. Stop normal saline since pt eating Check cmp in am   Hyperkalemia resolved Check cmp in am  Hyponatremia Resolved, decrease trileptal to 150mg  po bid  Rhabdomyolysis cpk trending down,  Repeat in am  Weakness PT and OT   Spondylolisthesis of lumbar region Decreasegabapentin 200 mg by mouth 3 times a day. Due to renal insufficiency Continue Flexeril 10 mg by mouth 3 times a day as needed. Continue oxycodone 20 mg by  mouth 3 times a day as needed.  Fibromyalgia Decrease gabapentin due to renal insufficiency Continue treatment as above.  Hypothyroidism Continue levothyroxine 25 g daily..   Hypertension Hold ARB due to AKI. Monitor blood pressure closely. When necessary antihypertensives  Type 2 diabetes mellitus (HCC) Carbohydrate modified diet. Hold metformin due to elevated creatinine. CBG monitoring with regular insulin sliding scale.  Anemia Check anemia profile and monitor H&H.  GERD (gastroesophageal reflux disease) Continue daily proton pump inhibitor. Pantoprazole 40 mg by mouth daily while in the hospital  Protein calorie malnutrition severe Start pro stat    Code Status : FULL CODE  Family Communication  :   Disposition Plan  :   Barriers For Discharge :   Consults  :  neurology  Procedures  : renal ultrasound 05/05/2016=> negative for hydronephrosis  DVT Prophylaxis  :  Lovenox - SCDs   Lab Results  Component Value Date   PLT 160 05/06/2016    Antibiotics  :   Anti-infectives    None        Objective:   Vitals:   05/05/16 1728 05/05/16 2004 05/06/16 0527 05/06/16 0930  BP: (!) 101/55 104/60 (!) 135/59 90/60  Pulse: (!) 106 (!) 101 (!) 109 (!) 114  Resp: 18 18 18 18   Temp: 97.9 F (36.6 C) 97.9 F (36.6 C) 98.7 F (37.1 C) 97.4 F (36.3 C)  TempSrc: Oral Oral Oral Oral  SpO2: 100% 95% 98% 97%  Weight:  83.8 kg (184 lb 11.9 oz)    Height:        Wt Readings from Last 3 Encounters:  05/05/16 83.8 kg (184 lb 11.9 oz)  02/16/16 86 kg (189 lb 8 oz)  11/09/15 87.5 kg (193 lb)     Intake/Output Summary (Last 24 hours) at 05/06/16 1040 Last data filed at 05/06/16 0900  Gross per 24  hour  Intake          2943.33 ml  Output              495 ml  Net          2448.33 ml     Physical Exam  Awake Alert, Oriented X 3, No new F.N deficits, Normal affect [place].AT,PERRAL Supple Neck,No JVD, No cervical lymphadenopathy appriciated.    Symmetrical Chest wall movement, Good air movement bilaterally, CTAB RRR,No Gallops,Rubs or new Murmurs, No Parasternal Heave +ve B.Sounds, Abd Soft, No tenderness, No organomegaly appriciated, No rebound - guarding or rigidity. No Cyanosis, Clubbing or edema, No new Rash or bruise   Slight resting tremor    Data Review:    CBC  Recent Labs Lab 05/03/16 1352 05/04/16 0658 05/06/16 0537  WBC 10.1 7.4 4.5  HGB 11.5* 9.5* 10.0*  HCT 35.9* 29.0* 32.0*  PLT 240 190 160  MCV 91.8 90.1 92.2  MCH 29.4 29.5 28.8  MCHC 32.0 32.8 31.3  RDW 15.6* 15.7* 15.7*  LYMPHSABS  --  1.4  --   MONOABS  --  1.1*  --   EOSABS  --  0.0  --   BASOSABS  --  0.0  --     Chemistries   Recent Labs Lab 05/03/16 1352 05/03/16 1645 05/03/16 1948 05/04/16 0658 05/04/16 1344 05/05/16 0536 05/06/16 0537  NA 129*  --   --  134* 134* 132* 136  K 6.0* 6.8*  --  5.5* 4.6 3.8 3.8  CL 101  --   --  108 109 108 112*  CO2 18*  --   --  20* 18* 18* 18*  GLUCOSE 146*  --   --  100* 109* 95 105*  BUN 38*  --   --  36* 35* 27* 25*  CREATININE 2.78*  --   --  2.20* 2.14* 1.81* 1.29*  CALCIUM 8.3*  --   --  7.8* 7.8* 7.2* 7.6*  MG  --   --  1.7  --   --   --   --   AST 29  --   --  25  --  49* 35  ALT 31  --   --  25  --  31 34  ALKPHOS 109  --   --  98  --  89 92  BILITOT 0.5  --   --  0.4  --  0.6 0.4   ------------------------------------------------------------------------------------------------------------------ No results for input(s): CHOL, HDL, LDLCALC, TRIG, CHOLHDL, LDLDIRECT in the last 72 hours.  No results found for: HGBA1C ------------------------------------------------------------------------------------------------------------------  Recent Labs  05/03/16 2219  TSH 2.344   ------------------------------------------------------------------------------------------------------------------  Recent Labs  05/03/16 1948  VITAMINB12 709  FOLATE 27.7  FERRITIN 48  TIBC 195*  IRON  21*  RETICCTPCT 1.7    Coagulation profile No results for input(s): INR, PROTIME in the last 168 hours.  No results for input(s): DDIMER in the last 72 hours.  Cardiac Enzymes No results for input(s): CKMB, TROPONINI, MYOGLOBIN in the last 168 hours.  Invalid input(s): CK ------------------------------------------------------------------------------------------------------------------ No results found for: BNP  Inpatient Medications  Scheduled Meds: . enoxaparin (LOVENOX) injection  30 mg Subcutaneous Q24H  . estradiol  1 mg Oral Daily  . feeding supplement (PRO-STAT SUGAR FREE 64)  30 mL Oral BID  . gabapentin  200 mg Oral TID  . insulin aspart  0-9 Units Subcutaneous TID WC  . levothyroxine  25 mcg Oral QAC breakfast  .  LORazepam  1 mg Intravenous Once  . OXcarbazepine  150 mg Oral QID  . pantoprazole  40 mg Oral Daily  . sodium chloride flush  3 mL Intravenous Q12H   Continuous Infusions:  PRN Meds:.cyclobenzaprine, ondansetron **OR** ondansetron (ZOFRAN) IV, oxyCODONE, promethazine, temazepam, triamcinolone  Micro Results Recent Results (from the past 240 hour(s))  Urine culture     Status: Abnormal   Collection Time: 05/03/16  5:12 PM  Result Value Ref Range Status   Specimen Description URINE, RANDOM  Final   Special Requests NONE  Final   Culture MULTIPLE SPECIES PRESENT, SUGGEST RECOLLECTION (A)  Final   Report Status 05/04/2016 FINAL  Final    Radiology Reports Dg Chest 2 View  Result Date: 05/03/2016 CLINICAL DATA:  Fall 2 days ago with shortness of breath, initial encounter EXAM: CHEST  2 VIEW COMPARISON:  12/28/2010 FINDINGS: Cardiac shadow is within normal limits. The lungs are well aerated bilaterally. No focal infiltrate or sizable effusion is seen. Postsurgical changes in the cervical spine are noted. No acute bony abnormality is seen. IMPRESSION: No active cardiopulmonary disease. Electronically Signed   By: Inez Catalina M.D.   On: 05/03/2016  17:46  Dg Shoulder Right  Result Date: 05/03/2016 CLINICAL DATA:  Fall 2 days ago with persistent right shoulder pain, initial encounter EXAM: RIGHT SHOULDER - 2+ VIEW COMPARISON:  None. FINDINGS: Acromioclavicular degenerative changes are noted. No fracture or dislocation is seen. The underlying bony thorax is within normal limits. Postsurgical changes are noted in the cervical spine. IMPRESSION: No acute abnormality noted. Electronically Signed   By: Inez Catalina M.D.   On: 05/03/2016 17:50  Ct Head Wo Contrast  Result Date: 05/03/2016 CLINICAL DATA:  Fall, weakness. EXAM: CT HEAD WITHOUT CONTRAST TECHNIQUE: Contiguous axial images were obtained from the base of the skull through the vertex without intravenous contrast. COMPARISON:  None. FINDINGS: Brain: Ventricles are within normal limits in size and configuration. All areas of the brain demonstrate normal gray-white matter attenuation. There is no mass, hemorrhage, edema or other evidence of acute parenchymal abnormality. No extra-axial hemorrhage. Vascular: No hyperdense vessel or unexpected calcification. There are chronic calcified atherosclerotic changes of the large vessels at the skull base. Skull: Negative for fracture or focal lesion. Sinuses/Orbits: No acute findings. Other: None. IMPRESSION: Negative head CT.  No intracranial mass, hemorrhage or edema. Electronically Signed   By: Franki Cabot M.D.   On: 05/03/2016 18:01  US Renal  Result Date: 05/05/2016 CLINICAL DATA:  Acute renal failure.  Diabetic patient. EXAM: RENAL / URINARY TRACT ULTRASOUND COMPLETE COMPARISON:  None. FINDINGS: Right Kidney: Length: 9.1 cm. Normal parenchymal echogenicity. Mild renal cortical thinning. No mass, stone or hydronephrosis. Left Kidney: Length: 10.8 cm. Echogenicity within normal limits. No mass or hydronephrosis visualized. Bladder: Appears normal for degree of bladder distention. IMPRESSION: 1. No acute finding.  No hydronephrosis. 2. Mild right renal  cortical thinning.  No other abnormality. Electronically Signed   By: Lajean Manes M.D.   On: 05/05/2016 22:47  Dg Foot Complete Right  Result Date: 05/03/2016 CLINICAL DATA:  Recent fall, subsequent encounter EXAM: RIGHT FOOT COMPLETE - 3+ VIEW COMPARISON:  05/01/2016 FINDINGS: No acute fracture or dislocation is noted. No soft tissue changes are seen. Small calcaneal spur is noted. IMPRESSION: No acute abnormality noted. No significant interval change from the study of 2 days previous. Electronically Signed   By: Inez Catalina M.D.   On: 05/03/2016 17:48  Dg Hip Unilat W Or Wo Pelvis  2-3 Views Right  Result Date: 05/03/2016 CLINICAL DATA:  Fall 2 days ago with right hip pain, initial encounter EXAM: DG HIP (WITH OR WITHOUT PELVIS) 2-3V RIGHT COMPARISON:  None. FINDINGS: Pelvic ring is intact. Postsurgical changes are noted in the lower lumbar spine. No acute fracture or dislocation is noted. Mild degenerative changes of the hip joints are seen bilaterally. Diffuse vascular calcifications are seen. IMPRESSION: No acute abnormality noted. Electronically Signed   By: Inez Catalina M.D.   On: 05/03/2016 17:47   Time Spent in minutes  30   Jani Gravel M.D on 05/06/2016 at 10:40 AM  Between 7am to 7pm - Pager - [phone]  After 7pm go to [email or web address] - password [facility]  [facility] -  Office  [phone]

## 2016-05-06 NOTE — Evaluation (Signed)
Physical Therapy Evaluation Patient Details Name: [name] MRN: [id number] DOB: 18-Jan-1951 Today's Date: 05/06/2016   History of Present Illness  65 y.o. female with medical history significant of anemia, cataracts, chronic back pain, fibromyalgia, fusion of the lumbar spine, depression, GERD, hemorrhoids, colon polyps, urolithiasis, hypertension, type 2 diabetes, hypothyroidism, idiopathic thrombocytopenia who was brought to the emergency department via rocking and EMS reporting tremors and frequent falls recently, including a fall 05/02/16 at around 1500 with inability to get up and having to lie down on the floor all night.     Clinical Impression  Pt admitted with above diagnosis. Pt currently with functional limitations due to the deficits listed below (see PT Problem List). Pt tolerated activity well but is not safe to d/c home without further strength and ambulation training. Pt will benefit from skilled PT to increase their independence and safety with mobility to allow discharge to the venue listed below.       Follow Up Recommendations SNF;Home health PT;Other (comment) Lengthy discussion with pt about d/c options. Pt stated she did not want SNF, but due to limited to no assistance at home, we feel d/c home is unsafe at this time. Definitely favor post-acute rehab to maximize independence and safety with mobility prior to dc home alone; if pt refuses, would maximize HHservices   Equipment Recommendations  Rolling walker with 5" wheels    Recommendations for Other Services OT consult     Precautions / Restrictions Precautions Precautions: Fall Restrictions Weight Bearing Restrictions: No      Mobility  Bed Mobility Overal bed mobility: Supervision (verbal cues)             General bed mobility comments: Verbal cues for hand placement and adjustments  Transfers Overall transfer level: Needs assistance Equipment used: Rolling walker (2 wheeled) Transfers: Sit  to/from Stand Sit to Stand: Min guard         General transfer comment: verbal cue for hand placement, push from bed, place hands on walker   Ambulation/Gait Ambulation/Gait assistance: Min guard Ambulation Distance (Feet): 150 Feet Assistive device: Rolling walker (2 wheeled) Gait Pattern/deviations: Antalgic;Decreased stride length;Decreased weight shift to left;Step-to pattern   Gait velocity interpretation: Below normal speed for age/gender General Gait Details: Pt heavily relied on UEs. Reluctant to place more weight on left leg due to pain. Encouraged deep breathing for relaxation due to pain. Pt unsteady when hands removed from walker. Trunk slightly flexed.   Stairs            Wheelchair Mobility    Modified Rankin (Stroke Patients Only)       Balance Overall balance assessment: Needs assistance Sitting-balance support: Bilateral upper extremity supported Sitting balance-Leahy Scale: Fair   Postural control: Right lateral lean Standing balance support: Bilateral upper extremity supported Standing balance-Leahy Scale: Poor Standing balance comment: unsteady when hands removed from walker.                              Pertinent Vitals/Pain Pain Assessment: 0-10 Pain Score: 6  Pain Location: Left hip and foot (Pain increases while ambulating; subsides upon return to sit) Pain Descriptors / Indicators: Aching;Discomfort;Grimacing Pain Intervention(s): Limited activity within patient's tolerance;Monitored during session;Repositioned    Home Living Family/patient expects to be discharged to:: Private residence Living Arrangements: Alone Available Help at Discharge: Family ([place] who may be able to drive her ) Type of Home: House Home Access: Stairs to enter Entrance  Stairs-Rails: Can reach both Entrance Stairs-Number of Steps: 2 Home Layout: One level Home Equipment: Grab bars - tub/shower;Shower seat      Prior Function Level of Independence:  Independent               Hand Dominance   Dominant Hand: Right    Extremity/Trunk Assessment   Upper Extremity Assessment: Generalized weakness (biceps and triceps 3/5. LUE temor noted at end of session. )           Lower Extremity Assessment: Generalized weakness (Hip flexors 2/5; Knee extension 4/5)  Did not report pain with MMT       Communication   Communication: No difficulties  Cognition Arousal/Alertness: Awake/alert Behavior During Therapy: WFL for tasks assessed/performed Overall Cognitive Status: Within Functional Limits for tasks assessed                      General Comments General comments (skin integrity, edema, etc.): Noted LUE temor at end of session. Pt pointed it out and was focused on it. No temor noted while distracted by converstation or activity. Tremor may be secondary to muscle fatigue from heavy reliance on UEs during ambulation.     Exercises        Assessment/Plan    PT Assessment Patient needs continued PT services  PT Diagnosis Difficulty walking;Generalized weakness;Acute pain   PT Problem List Decreased strength;Decreased balance;Decreased mobility;Pain  PT Treatment Interventions Gait training;DME instruction;Therapeutic activities;Therapeutic exercise;Functional mobility training;Stair training;Balance training   PT Goals (Current goals can be found in the Care Plan section) Acute Rehab PT Goals Patient Stated Goal: To go home PT Goal Formulation: With patient Time For Goal Achievement: 05/20/16 Potential to Achieve Goals: Fair    Frequency Min 3X/week   Barriers to discharge Decreased caregiver support      Co-evaluation               End of Session Equipment Utilized During Treatment: Gait belt Activity Tolerance: Patient limited by pain;Patient limited by fatigue Patient left: in chair;with call bell/phone within reach Nurse Communication: Mobility status         Time: 1220-1255 PT Time  Calculation (min) (ACUTE ONLY): 35 min   Charges:    05/06/16 1500  PT General Charges  $$ ACUTE PT VISIT 1 Procedure  PT Evaluation  $PT Eval Moderate Complexity 1 Procedure  PT Treatments  $Gait Training 8-22 mins          PT G CodesJabier Mutton, SPT Acute Rehab Services [phone]   Jabier Mutton 05/06/2016, 5:23 PM   I was present during the PT session and agree with patient status and findings as outlined by Joeline, SPT.  Roney [place], [place]  Acute Rehabilitation Services Pager [phone] Office [phone]

## 2016-05-07 ENCOUNTER — Ambulatory Visit: Payer: Self-pay | Admitting: Nurse Practitioner

## 2016-05-07 DIAGNOSIS — M4316 Spondylolisthesis, lumbar region: Secondary | ICD-10-CM

## 2016-05-07 DIAGNOSIS — G8929 Other chronic pain: Secondary | ICD-10-CM

## 2016-05-07 DIAGNOSIS — E119 Type 2 diabetes mellitus without complications: Secondary | ICD-10-CM

## 2016-05-07 DIAGNOSIS — M5415 Radiculopathy, thoracolumbar region: Secondary | ICD-10-CM

## 2016-05-07 DIAGNOSIS — E669 Obesity, unspecified: Secondary | ICD-10-CM

## 2016-05-07 DIAGNOSIS — M545 Low back pain, unspecified: Secondary | ICD-10-CM

## 2016-05-07 DIAGNOSIS — D61818 Other pancytopenia: Secondary | ICD-10-CM

## 2016-05-07 DIAGNOSIS — W19XXXD Unspecified fall, subsequent encounter: Secondary | ICD-10-CM

## 2016-05-07 DIAGNOSIS — D619 Aplastic anemia, unspecified: Secondary | ICD-10-CM

## 2016-05-07 DIAGNOSIS — M797 Fibromyalgia: Secondary | ICD-10-CM

## 2016-05-07 DIAGNOSIS — T796XXA Traumatic ischemia of muscle, initial encounter: Secondary | ICD-10-CM

## 2016-05-07 DIAGNOSIS — N179 Acute kidney failure, unspecified: Secondary | ICD-10-CM

## 2016-05-07 DIAGNOSIS — T796XXD Traumatic ischemia of muscle, subsequent encounter: Secondary | ICD-10-CM

## 2016-05-07 DIAGNOSIS — M961 Postlaminectomy syndrome, not elsewhere classified: Secondary | ICD-10-CM

## 2016-05-07 DIAGNOSIS — M539 Dorsopathy, unspecified: Secondary | ICD-10-CM

## 2016-05-07 DIAGNOSIS — W19XXXA Unspecified fall, initial encounter: Secondary | ICD-10-CM

## 2016-05-07 DIAGNOSIS — N170 Acute kidney failure with tubular necrosis: Secondary | ICD-10-CM

## 2016-05-07 DIAGNOSIS — M4310 Spondylolisthesis, site unspecified: Secondary | ICD-10-CM

## 2016-05-07 DIAGNOSIS — Z5189 Encounter for other specified aftercare: Secondary | ICD-10-CM

## 2016-05-07 LAB — COMPREHENSIVE METABOLIC PANEL
ALBUMIN: 1.8 g/dL — AB (ref 3.5–5.0)
ALT: 43 U/L (ref 14–54)
AST: 34 U/L (ref 15–41)
Alkaline Phosphatase: 100 U/L (ref 38–126)
Anion gap: 3 — ABNORMAL LOW (ref 5–15)
BILIRUBIN TOTAL: 0.7 mg/dL (ref 0.3–1.2)
BUN: 22 mg/dL — AB (ref 6–20)
CO2: 23 mmol/L (ref 22–32)
CREATININE: 1.34 mg/dL — AB (ref 0.44–1.00)
Calcium: 7.9 mg/dL — ABNORMAL LOW (ref 8.9–10.3)
Chloride: 110 mmol/L (ref 101–111)
GFR calc Af Amer: 47 mL/min — ABNORMAL LOW (ref >60)
GFR, EST NON AFRICAN AMERICAN: 40 mL/min — AB (ref >60)
GLUCOSE: 120 mg/dL — AB (ref 65–99)
POTASSIUM: 3.9 mmol/L (ref 3.5–5.1)
Sodium: 136 mmol/L (ref 135–145)
TOTAL PROTEIN: 3.9 g/dL — AB (ref 6.5–8.1)

## 2016-05-07 LAB — CBC
HEMATOCRIT: 29.8 % — AB (ref 36.0–46.0)
HEMOGLOBIN: 9.4 g/dL — AB (ref 12.0–15.0)
MCH: 29.3 pg (ref 26.0–34.0)
MCHC: 31.5 g/dL (ref 30.0–36.0)
MCV: 92.8 fL (ref 78.0–100.0)
Platelets: 144 10*3/uL — ABNORMAL LOW (ref 150–400)
RBC: 3.21 MIL/uL — AB (ref 3.87–5.11)
RDW: 15.4 % (ref 11.5–15.5)
WBC: 3.5 10*3/uL — AB (ref 4.0–10.5)

## 2016-05-07 LAB — SEDIMENTATION RATE: Sed Rate: 2 mm/hr (ref 0–22)

## 2016-05-07 LAB — ANTINUCLEAR ANTIBODIES, IFA: ANA Ab, IFA: NEGATIVE

## 2016-05-07 LAB — GLUCOSE, CAPILLARY
GLUCOSE-CAPILLARY: 105 mg/dL — AB (ref 65–99)
Glucose-Capillary: 108 mg/dL — ABNORMAL HIGH (ref 65–99)

## 2016-05-07 MED ORDER — OXYCODONE HCL 20 MG PO TABS: 20.0000 mg | ORAL_TABLET | Freq: Three times a day (TID) | ORAL | 0 refills | Status: DC | PRN

## 2016-05-07 MED ORDER — PRO-STAT SUGAR FREE PO LIQD: 30.0000 mL | Freq: Two times a day (BID) | ORAL | 0 refills | Status: DC

## 2016-05-07 MED ORDER — GLIPIZIDE 2.5 MG HALF TABLET: 2.5000 mg | ORAL_TABLET | Freq: Two times a day (BID) | ORAL | Status: DC

## 2016-05-07 MED ORDER — GABAPENTIN 300 MG PO CAPS: 300.0000 mg | ORAL_CAPSULE | Freq: Every day | ORAL | 0 refills | Status: DC

## 2016-05-07 NOTE — Care Management Note (Addendum)
Case Management Note  Patient Details  Name: FELIS SWADLEY MRN: [id number] Date of Birth: December 21, 1950  Subjective/Objective:                    Action/Plan: 05/07/2016 Spoke with pt and sister via speaker phone, pt and sister in agreement that pt would benefit from short term SNF, while pt does not want to go she is aware that she can not get 24 hr care in the home or adequate PT to restore her strength. Both in agreement for her to go to short term SNF.   Expected Discharge Date:  05/07/16               Expected Discharge Plan:   CIR or SNF  In-House Referral:  NA  Discharge planning Services     Post Acute Care Choice:    Choice offered to:     DME Arranged:   NA DME Agency:   NA  HH Arranged:   NA HH Agency:   NA  Status of Service:  complete  If discussed at [place] of Stay Meetings, dates discussed:    Additional Comments:  Adron Bene, RN 05/07/2016, 11:40 AM

## 2016-05-07 NOTE — Clinical Social Work Note (Signed)
Clinical Social Work Assessment  Patient Details  Name: [name] MRN: [id number] Date of Birth: [birth year]  Date of referral:  05/07/16               Reason for consult:  Facility Placement                Permission sought to share information with:  Family Supports Permission granted to share information::  Yes, Verbal Permission Granted  Name::     [name]::     Relationship::  Sister  Contact Information:  [phone]  Housing/Transportation Living arrangements for the past 2 months:  Single Family Home Source of Information:  Patient Patient Interpreter Needed:  None Criminal Activity/Legal Involvement Pertinent to Current Situation/Hospitalization:  No - Comment as needed Significant Relationships:  Siblings, Other Family Members Lives with:  Self Do you feel safe going back to the place where you live?  No (Patient expressed understanding that she is weakened and needs ST rehab before returning home, especially since she lives alone.) Need for family participation in patient care:  Yes (Comment) (Patient's sister is her primary support)  Care giving concerns:  Patient lives alone and expressed understanding that [name] rehab can benefit her as she lives alone. Patient provided CSW with sequence of events that led to her to coming to hospital - falling in her closet and being there for 3 days before being able to get to her phone and call for help.   Social Worker assessment / plan:  CSW talked with patient regarding dishcarge planning and recommendation of ST rehab. Patient was lying in bed, and is alert, oriented and open to speaking with CSW regarding discharge planning. Patient provided CSW information/history regarding her family and her support system (primarily her sister [name]). [name] shared that she has a daughter (and grandchildren) that live in [place], however she is estranged from her daughter due to an incident that happened with her oldest granddaughter  and her daughter's husband several years ago.   Patient agreeable to ST rehab and her faciliyty preferences are #1-[facility] and #2-[facility]. CSW explained the facility search process    Employment status:  Retired Forensic scientist:  Information systems manager, [employer] ([place]) PT Recommendations:  [place] / Referral to community resources:  [place] (Skilled facility list not provided as patient expressed her preferences)  Patient/Family's Response to care: No concerns expressed by patient regarding care during hospitalization.  Patient/Family's Understanding of and Emotional Response to Diagnosis, Current Treatment, and Prognosis: Not discussed.  Emotional Assessment Appearance:  Appears stated age Attitude/Demeanor/Rapport:  Other (Appropriate) Affect (typically observed):  Pleasant, Appropriate Orientation:  Oriented to Self, Oriented to Place, Oriented to  Time, Oriented to Situation Alcohol / Substance use:  Never Used Psych involvement (Current and /or in the community):  No (Comment)  Discharge Needs  Concerns to be addressed:  Discharge Planning Concerns Readmission within the last 30 days:  No Current discharge risk:  None Barriers to Discharge:  No Barriers Identified   Kelli Feil, LCSW 05/07/2016, 1:57 PM

## 2016-05-07 NOTE — Care Management Important Message (Signed)
Important Message  Patient Details  Name: [name] MRN: [id number] Date of Birth: 11/14/1950   Medicare Important Message Given:  Yes    [name] 05/07/2016, 3:39 PM

## 2016-05-07 NOTE — Progress Notes (Signed)
   05/07/16 1300  Clinical Encounter Type  Visited With Patient  Visit Type Spiritual support  Referral From Chaplain  Consult/Referral To Chaplain  Spiritual Encounters  Spiritual Needs Literature;Other (Comment)  Stress Factors  Patient Stress Factors Exhausted;Financial concerns;Health changes;Lack of caregivers;Loss  Family Stress Factors Health changes  Chaplain responded to consult, provided emotional support, spiritual presence, AD completed, consulted with SW for further outside resources r/t pending discharge. Chaplain will be available for further support as needed.

## 2016-05-07 NOTE — Progress Notes (Signed)
Physical Therapy Treatment  Patient Details Name: [name] MRN: [id number] DOB: October 22, 1950 Today's Date: 05/07/2016    History of Present Illness 65 y.o. female with medical history significant of anemia, cataracts, chronic back pain, fibromyalgia, fusion of the lumbar spine, depression, GERD, hemorrhoids, colon polyps, urolithiasis, hypertension, type 2 diabetes, hypothyroidism, idiopathic thrombocytopenia who was brought to the emergency department via rocking and EMS reporting tremors and frequent falls recently, including a fall 05/02/16 at around 1500 with inability to get up and having to lie down on the floor all night.     PT Comments    Pt was with Social Work at start of session discussing d/c to SNF, which we fully agree with. We anticipate d/c to SNF today. Pt transferred from supine to sit EOB with minimal cues and less effort than yesterday. Pt eager to d/c and only agreed to get dressed for d/c. Pt successfully changed without fatigue and verbal moaning from right hip and foot pain. No tremor noted during manual tasks. Evidence of generalized weakness while standing. Agree with SNF to maximize independence and safety with mobility prior to d/c home.      Follow Up Recommendations  SNF     Equipment Recommendations  Rolling walker with 5" wheels    Recommendations for Other Services OT consult     Precautions / Restrictions Precautions Precautions: Fall Restrictions Weight Bearing Restrictions: No    Mobility  Bed Mobility Overal bed mobility: Modified Independent (verbal cues)             General bed mobility comments: Verbal cues for hand placement and adjustments  Transfers Overall transfer level: Needs assistance Equipment used: Rolling walker (2 wheeled) Transfers: Sit to/from Stand Sit to Stand: Min guard         General transfer comment: verbal cue for hand placement   Ambulation/Gait    Pt declined             Stairs             Wheelchair Mobility    Modified Rankin (Stroke Patients Only)       Balance                                    Cognition Arousal/Alertness: Awake/alert Behavior During Therapy: WFL for tasks assessed/performed Overall Cognitive Status: Within Functional Limits for tasks assessed                      Exercises      General Comments General comments (skin integrity, edema, etc.): No temor noted. Pt was preparing for d/c to SNF. Pt successfully dressed herself. Demonstrated good sitting balance EOB while changing. Pt noted swollen feet. Very dry flaking skin on both LEs.       Pertinent Vitals/Pain Pain Location: Right hip and foot (incorrectly documented previously) Pain Descriptors / Indicators: Aching;Discomfort;Grimacing Pain Intervention(s): Monitored during session;Repositioned    Home Living                      Prior Function            PT Goals (current goals can now be found in the care plan section) Acute Rehab PT Goals Patient Stated Goal: To go to SNF for rehab before returning home PT Goal Formulation: With patient Time For Goal Achievement: 05/20/16 Potential to Achieve Goals: Fair Progress towards PT  goals: Progressing toward goals    Frequency  Min 3X/week    PT Plan Current plan remains appropriate    Co-evaluation             End of Session   Activity Tolerance: Patient tolerated treatment well Patient left: in bed;with call bell/phone within reach;Other (comment) (left with SNF admissions on EOB )     Time: [id number] PT Time Calculation (min) (ACUTE ONLY): 17 min  Charges:       2016/05/09 1600  PT General Charges  $$ ACUTE PT VISIT 1 Procedure  PT Treatments  $Therapeutic Activity 8-22 mins                     G Codes:      Jabier Mutton 2016/05/09, 4:28 PM   Jabier Mutton, SPT Acute Rehab Services [phone]  I was present during the PT session and agree with patient status and  findings as outlined by Joeline, SPT.  Roney [place], [place]  Acute Rehabilitation Services Pager [phone] Office [phone]

## 2016-05-07 NOTE — Progress Notes (Signed)
Patient refused to wear telemetry and labs drawn. Rescheduled labs for a later time. Will update MD.

## 2016-05-07 NOTE — Discharge Summary (Signed)
Physician Discharge Summary  [name] MRN: [id number] DOB/AGE: 1950/11/27 65 y.o.  PCP: Joya Gaskins, MD   Admit date: 05/03/2016 Discharge date: 05/07/2016  Discharge Diagnoses:    Principal Problem:   AKI (acute kidney injury) ([place]) Active Problems:   Spondylolisthesis of lumbar region   Hyperkalemia   Hypothyroidism   Hypertension   Rhabdomyolysis   Type 2 diabetes mellitus (HCC)   Anemia   GERD (gastroesophageal reflux disease)   Fibromyalgia   Hyponatremia   Weakness   Protein-calorie malnutrition, severe (HCC)   Acute renal failure (ARF) ([place])   Fall    Follow-up recommendations Follow-up with PCP in 3-5 days , including all  additional recommended appointments as below Follow-up CBC, CMP in 3-5 days       Current Discharge Medication List    START taking these medications   Details  Amino Acids-Protein Hydrolys (FEEDING SUPPLEMENT, PRO-STAT SUGAR FREE 64,) LIQD Take 30 mLs by mouth 2 (two) times daily. Qty: 900 mL, Refills: 0      CONTINUE these medications which have CHANGED   Details  gabapentin (NEURONTIN) 300 MG capsule Take 1 capsule (300 mg total) by mouth at bedtime. Qty: 30 capsule, Refills: 0    oxyCODONE 20 MG TABS Take 1 tablet (20 mg total) by mouth 3 (three) times daily as needed (for pain). Qty: 30 tablet, Refills: 0      CONTINUE these medications which have NOT CHANGED   Details  cyclobenzaprine (FLEXERIL) 10 MG tablet Take 10 mg by mouth 3 (three) times daily as needed for muscle spasms.    estradiol (ESTRACE) 1 MG tablet Take 1 mg by mouth daily.    levothyroxine (SYNTHROID, LEVOTHROID) 25 MCG tablet Take 25 mcg by mouth daily before breakfast.    omeprazole (PRILOSEC) 20 MG capsule Take 1 capsule (20 mg total) by mouth 2 (two) times daily before a meal. Qty: 60 capsule, Refills: 6    OXcarbazepine (TRILEPTAL) 150 MG tablet Take one tablet four times daily. Qty: 120 tablet, Refills: 11    promethazine  (PHENERGAN) 25 MG tablet Take 25 mg by mouth every 6 (six) hours as needed for nausea or vomiting.    temazepam (RESTORIL) 30 MG capsule Take 30 mg by mouth at bedtime as needed for sleep.     triamcinolone (NASACORT ALLERGY 24HR) 55 MCG/ACT AERO nasal inhaler Place 2 sprays into the nose daily as needed (for congestion).    Vitamin D, Ergocalciferol, (DRISDOL) 50000 units CAPS capsule Take 50,000 Units by mouth once a week.      STOP taking these medications     gabapentin (NEURONTIN) 600 MG tablet      meloxicam (MOBIC) 7.5 MG tablet      metFORMIN (GLUCOPHAGE) 1000 MG tablet      valsartan (DIOVAN) 80 MG tablet          Discharge Condition: Overall prognosis guarded   Discharge Instructions Get Medicines reviewed and adjusted: Please take all your medications with you for your next visit with your Primary MD  Please request your Primary MD to go over all hospital tests and procedure/radiological results at the follow up, please ask your Primary MD to get all Hospital records sent to his/her office.  If you experience worsening of your admission symptoms, develop shortness of breath, life threatening emergency, suicidal or homicidal thoughts you must seek medical attention immediately by calling 911 or calling your MD immediately if symptoms less severe.  You must read complete instructions/literature along with  all the possible adverse reactions/side effects for all the Medicines you take and that have been prescribed to you. Take any new Medicines after you have completely understood and accpet all the possible adverse reactions/side effects.   Do not drive when taking Pain medications.   Do not take more than prescribed Pain, Sleep and Anxiety Medications  Special Instructions: If you have smoked or chewed Tobacco in the last 2 yrs please stop smoking, stop any regular Alcohol and or any Recreational drug use.  Wear Seat belts while driving.  Please note  You  were cared for by a hospitalist during your hospital stay. Once you are discharged, your primary care physician will handle any further medical issues. Please note that NO REFILLS for any discharge medications will be authorized once you are discharged, as it is imperative that you return to your primary care physician (or establish a relationship with a primary care physician if you do not have one) for your aftercare needs so that they can reassess your need for medications and monitor your lab values.  Discharge Instructions    Diet - low sodium heart healthy    Complete by:  As directed   Increase activity slowly    Complete by:  As directed       Allergies  Allergen Reactions  . Formaldehyde     Other reaction(s): Shortness Of Breath, coughing  . Altace [Ramipril] Cough  . Dilaudid [Hydromorphone] Itching    Drip OK, pills cause itching      Disposition: 01-Home or Self Care   Consults:  Neurology     Significant Diagnostic Studies:  Dg Chest 2 View  Result Date: 05/03/2016 CLINICAL DATA:  Fall 2 days ago with shortness of breath, initial encounter EXAM: CHEST  2 VIEW COMPARISON:  12/28/2010 FINDINGS: Cardiac shadow is within normal limits. The lungs are well aerated bilaterally. No focal infiltrate or sizable effusion is seen. Postsurgical changes in the cervical spine are noted. No acute bony abnormality is seen. IMPRESSION: No active cardiopulmonary disease. Electronically Signed   By: Inez Catalina M.D.   On: 05/03/2016 17:46  Dg Shoulder Right  Result Date: 05/03/2016 CLINICAL DATA:  Fall 2 days ago with persistent right shoulder pain, initial encounter EXAM: RIGHT SHOULDER - 2+ VIEW COMPARISON:  None. FINDINGS: Acromioclavicular degenerative changes are noted. No fracture or dislocation is seen. The underlying bony thorax is within normal limits. Postsurgical changes are noted in the cervical spine. IMPRESSION: No acute abnormality noted. Electronically Signed   By: Inez Catalina M.D.   On: 05/03/2016 17:50  Ct Head Wo Contrast  Result Date: 05/03/2016 CLINICAL DATA:  Fall, weakness. EXAM: CT HEAD WITHOUT CONTRAST TECHNIQUE: Contiguous axial images were obtained from the base of the skull through the vertex without intravenous contrast. COMPARISON:  None. FINDINGS: Brain: Ventricles are within normal limits in size and configuration. All areas of the brain demonstrate normal gray-white matter attenuation. There is no mass, hemorrhage, edema or other evidence of acute parenchymal abnormality. No extra-axial hemorrhage. Vascular: No hyperdense vessel or unexpected calcification. There are chronic calcified atherosclerotic changes of the large vessels at the skull base. Skull: Negative for fracture or focal lesion. Sinuses/Orbits: No acute findings. Other: None. IMPRESSION: Negative head CT.  No intracranial mass, hemorrhage or edema. Electronically Signed   By: Franki Cabot M.D.   On: 05/03/2016 18:01  US Renal  Result Date: 05/05/2016 CLINICAL DATA:  Acute renal failure.  Diabetic patient. EXAM: RENAL / URINARY TRACT ULTRASOUND  COMPLETE COMPARISON:  None. FINDINGS: Right Kidney: Length: 9.1 cm. Normal parenchymal echogenicity. Mild renal cortical thinning. No mass, stone or hydronephrosis. Left Kidney: Length: 10.8 cm. Echogenicity within normal limits. No mass or hydronephrosis visualized. Bladder: Appears normal for degree of bladder distention. IMPRESSION: 1. No acute finding.  No hydronephrosis. 2. Mild right renal cortical thinning.  No other abnormality. Electronically Signed   By: Lajean Manes M.D.   On: 05/05/2016 22:47  Dg Foot Complete Right  Result Date: 05/03/2016 CLINICAL DATA:  Recent fall, subsequent encounter EXAM: RIGHT FOOT COMPLETE - 3+ VIEW COMPARISON:  05/01/2016 FINDINGS: No acute fracture or dislocation is noted. No soft tissue changes are seen. Small calcaneal spur is noted. IMPRESSION: No acute abnormality noted. No significant interval change  from the study of 2 days previous. Electronically Signed   By: Inez Catalina M.D.   On: 05/03/2016 17:48  Dg Hip Unilat W Or Wo Pelvis 2-3 Views Right  Result Date: 05/03/2016 CLINICAL DATA:  Fall 2 days ago with right hip pain, initial encounter EXAM: DG HIP (WITH OR WITHOUT PELVIS) 2-3V RIGHT COMPARISON:  None. FINDINGS: Pelvic ring is intact. Postsurgical changes are noted in the lower lumbar spine. No acute fracture or dislocation is noted. Mild degenerative changes of the hip joints are seen bilaterally. Diffuse vascular calcifications are seen. IMPRESSION: No acute abnormality noted. Electronically Signed   By: Inez Catalina M.D.   On: 05/03/2016 17:47     Filed Weights   05/03/16 2101 05/04/16 2040 05/05/16 2004  Weight: 82.1 kg (181 lb) 82.3 kg (181 lb 7 oz) 83.8 kg (184 lb 11.9 oz)     Microbiology: Recent Results (from the past 240 hour(s))  Urine culture     Status: Abnormal   Collection Time: 05/03/16  5:12 PM  Result Value Ref Range Status   Specimen Description URINE, RANDOM  Final   Special Requests NONE  Final   Culture MULTIPLE SPECIES PRESENT, SUGGEST RECOLLECTION (A)  Final   Report Status 05/04/2016 FINAL  Final       Blood Culture    Component Value Date/Time   SDES URINE, RANDOM 05/03/2016 1712   SPECREQUEST NONE 05/03/2016 1712   CULT MULTIPLE SPECIES PRESENT, SUGGEST RECOLLECTION (A) 05/03/2016 1712   REPTSTATUS 05/04/2016 FINAL 05/03/2016 1712      Labs: Results for orders placed or performed during the hospital encounter of 05/03/16 (from the past 48 hour(s))  Glucose, capillary     Status: Abnormal   Collection Time: 05/05/16 11:58 AM  Result Value Ref Range   Glucose-Capillary 127 (H) 65 - 99 mg/dL  Glucose, capillary     Status: Abnormal   Collection Time: 05/05/16  5:19 PM  Result Value Ref Range   Glucose-Capillary 196 (H) 65 - 99 mg/dL  Glucose, capillary     Status: Abnormal   Collection Time: 05/05/16  8:02 PM  Result Value Ref Range    Glucose-Capillary 127 (H) 65 - 99 mg/dL  CBC     Status: Abnormal   Collection Time: 05/06/16  5:37 AM  Result Value Ref Range   WBC 4.5 4.0 - 10.5 K/uL   RBC 3.47 (L) 3.87 - 5.11 MIL/uL   Hemoglobin 10.0 (L) 12.0 - 15.0 g/dL   HCT 32.0 (L) 36.0 - 46.0 %   MCV 92.2 78.0 - 100.0 fL   MCH 28.8 26.0 - 34.0 pg   MCHC 31.3 30.0 - 36.0 g/dL   RDW 15.7 (H) 11.5 - 15.5 %  Platelets 160 150 - 400 K/uL  Comprehensive metabolic panel     Status: Abnormal   Collection Time: 05/06/16  5:37 AM  Result Value Ref Range   Sodium 136 135 - 145 mmol/L   Potassium 3.8 3.5 - 5.1 mmol/L   Chloride 112 (H) 101 - 111 mmol/L   CO2 18 (L) 22 - 32 mmol/L   Glucose, Bld 105 (H) 65 - 99 mg/dL   BUN 25 (H) 6 - 20 mg/dL   Creatinine, Ser 1.29 (H) 0.44 - 1.00 mg/dL   Calcium 7.6 (L) 8.9 - 10.3 mg/dL   Total Protein 3.4 (L) 6.5 - 8.1 g/dL   Albumin 1.7 (L) 3.5 - 5.0 g/dL   AST 35 15 - 41 U/L   ALT 34 14 - 54 U/L   Alkaline Phosphatase 92 38 - 126 U/L   Total Bilirubin 0.4 0.3 - 1.2 mg/dL   GFR calc non Af Amer 42 (L) >60 mL/min   GFR calc Af Amer 49 (L) >60 mL/min    Comment: (NOTE) The eGFR has been calculated using the CKD EPI equation. This calculation has not been validated in all clinical situations. eGFR's persistently <60 mL/min signify possible Chronic Kidney Disease.    Anion gap 6 5 - 15  CK     Status: Abnormal   Collection Time: 05/06/16  5:37 AM  Result Value Ref Range   Total CK 385 (H) 38 - 234 U/L  Glucose, capillary     Status: Abnormal   Collection Time: 05/06/16  8:12 AM  Result Value Ref Range   Glucose-Capillary 123 (H) 65 - 99 mg/dL  Glucose, capillary     Status: Abnormal   Collection Time: 05/06/16 12:16 PM  Result Value Ref Range   Glucose-Capillary 115 (H) 65 - 99 mg/dL  Glucose, capillary     Status: Abnormal   Collection Time: 05/06/16  5:17 PM  Result Value Ref Range   Glucose-Capillary 118 (H) 65 - 99 mg/dL  Glucose, capillary     Status: Abnormal   Collection  Time: 05/06/16  8:43 PM  Result Value Ref Range   Glucose-Capillary 164 (H) 65 - 99 mg/dL  CBC     Status: Abnormal   Collection Time: 05/07/16  5:24 AM  Result Value Ref Range   WBC 3.5 (L) 4.0 - 10.5 K/uL   RBC 3.21 (L) 3.87 - 5.11 MIL/uL   Hemoglobin 9.4 (L) 12.0 - 15.0 g/dL   HCT 29.8 (L) 36.0 - 46.0 %   MCV 92.8 78.0 - 100.0 fL   MCH 29.3 26.0 - 34.0 pg   MCHC 31.5 30.0 - 36.0 g/dL   RDW 15.4 11.5 - 15.5 %   Platelets 144 (L) 150 - 400 K/uL  Comprehensive metabolic panel     Status: Abnormal   Collection Time: 05/07/16  5:24 AM  Result Value Ref Range   Sodium 136 135 - 145 mmol/L   Potassium 3.9 3.5 - 5.1 mmol/L   Chloride 110 101 - 111 mmol/L   CO2 23 22 - 32 mmol/L   Glucose, Bld 120 (H) 65 - 99 mg/dL   BUN 22 (H) 6 - 20 mg/dL   Creatinine, Ser 1.34 (H) 0.44 - 1.00 mg/dL   Calcium 7.9 (L) 8.9 - 10.3 mg/dL   Total Protein 3.9 (L) 6.5 - 8.1 g/dL   Albumin 1.8 (L) 3.5 - 5.0 g/dL   AST 34 15 - 41 U/L   ALT 43 14 - 54 U/L  Alkaline Phosphatase 100 38 - 126 U/L   Total Bilirubin 0.7 0.3 - 1.2 mg/dL   GFR calc non Af Amer 40 (L) >60 mL/min   GFR calc Af Amer 47 (L) >60 mL/min    Comment: (NOTE) The eGFR has been calculated using the CKD EPI equation. This calculation has not been validated in all clinical situations. eGFR's persistently <60 mL/min signify possible Chronic Kidney Disease.    Anion gap 3 (L) 5 - 15  Sedimentation rate     Status: None   Collection Time: 05/07/16  5:24 AM  Result Value Ref Range   Sed Rate 2 0 - 22 mm/hr  Glucose, capillary     Status: Abnormal   Collection Time: 05/07/16  7:57 AM  Result Value Ref Range   Glucose-Capillary 108 (H) 65 - 99 mg/dL     Lipid Panel  No results found for: CHOL, TRIG, HDL, CHOLHDL, VLDL, LDLCALC, LDLDIRECT   No results found for: HGBA1C   Lab Results  Component Value Date   CREATININE 1.34 (H) 05/07/2016    Brief Narrative  65 y.o.femalewith medical history significant of anemia, cataracts,  chronic back pain, fibromyalgia, fusion of the lumbar spine, depression, GERD, hemorrhoids, colon polyps, urolithiasis, hypertension, type 2 diabetes, hypothyroidism, idiopathic thrombocytopenia who was brought to the emergency department    EMS reporting tremors and frequent falls recently, including a fall yesterday at around 1500 with inability to get up and having to lie down on the floor all night.   Per patient, she has been feeling weak for the past 2-3 weeks. She has noticed also the development of tremors during thisperiod. She states that she thought it was her high dose prednisone (40 mg by mouth daily) and discontinued it, but this has not affected her symptoms in any way. She states that over the past few days, she has been increasingly weak, has fallen about 4 times, including this last fall at home in her walking closet. She is not sure if she was lying down on the floor for 1 or 2 days, but she thinks that it happened yesterday. She denies fever, chills, headache, sore throat, productive cough, diarrhea, dysuria or hematuria.   ED Course:Workup in the ER shows hyperkalemia, mild anemia, AKI,EKG was sinus tachycardia. The patient receivedIV fluids, oral Kayexalate, albuterol nebulized, IV dextrose and insulin for hyperkalemia treatment. She states that she feels a little better.       Hospital course      Tremor ? Secondary to electrolyte abnormalities Suspect that this was in the setting of generalized weakness in the setting of hyponatremia hyperkalemia, acute renal failure, metabolic derangements She also has a history of chronic back pain and fibromyalgia Suspect that the patient presented with a component of adrenal insufficiency with abrupt discontinuation of the steroids , patient is also anemic The tremor that was noted on exam appears most consistent with enhanced physiological tremor as per neurology. It did not have the typical characteristics associated with  Parkinson's disease, the multiple system atrophies, or essential tremor. Physical therapy recommended SNF vs home health If the patient does decide to be discharged home with the fall understanding that she remains unsafe to go home, then she would be a high risk for readmission   Urinary retention resolve, likely secondary to narcotics    AKI (acute kidney injury) ([place]) slowly resolving, prerenal/renal insufficiency/medications Could also be secondary to mild rhabdomyolysis Holdingvalsartan, metformin and meloxicam.  Creatinine improved from 2.78> 1.34   Hyperkalemia  resolved Check cmp in am  Hyponatremia Resolved, decrease trileptal to 160m po bid  Rhabdomyolysis cpk trending down,  Repeat in am  Weakness PT and OT   Spondylolisthesis of lumbar region Dose of gabapentin and oxycodone reducedue Flexeril 10 mg by mouth 3 times a day as needed. Continue oxycodone 20 mg by mouth 3 times a day as needed.  Fibromyalgia Decrease gabapentin due to renal insufficiency Continue treatment as above.  Hypothyroidism Continue levothyroxine 25 g daily..   Hypertension Hold ARB due to AKI. Monitor blood pressure closely. When necessary antihypertensives  Type 2 diabetes mellitus (HCC) Carbohydrate modified diet. Hold metformin due to elevated creatinine.Started on glucotrol CBG monitoring with regular insulin sliding scale.  Anemia Check anemia profile and monitor H&H.  GERD (gastroesophageal reflux disease) Continue daily proton pump inhibitor. Pantoprazole 40 mg by mouth daily while in the hospital  Protein calorie malnutrition severe Start pro stat    Code Status : FULL CODE   Discharge Exam:  Blood pressure 125/75, pulse (!) 103, temperature 97.9 F (36.6 C), temperature source Oral, resp. rate 18, height '5\' 2"'  (1.575 m), weight 83.8 kg (184 lb 11.9 oz), SpO2 97 %.    Awake Alert, Oriented X 3, No new F.N deficits, Normal  affect [place].AT,PERRAL Supple Neck,No JVD, No cervical lymphadenopathy appriciated.  Symmetrical Chest wall movement, Good air movement bilaterally, CTAB RRR,No Gallops,Rubs or new Murmurs, No Parasternal Heave +ve B.Sounds, Abd Soft, No tenderness, No organomegaly appriciated, No rebound - guarding or rigidity. No Cyanosis, Clubbing or edema, No new Rash or bruise   Slight resting tremor    Signed: Xandria Gallaga 05/07/2016, 10:05 AM        Time spent >45 mins

## 2016-05-07 NOTE — Progress Notes (Signed)
Dawna Part to be D/C'd Skilled nursing facility per MD order.  Discussed prescriptions and follow up appointments with the patient. Prescriptions given to patient, medication list explained in detail. Pt verbalized understanding.    Medication List    STOP taking these medications   meloxicam 7.5 MG tablet Commonly known as:  MOBIC   metFORMIN 1000 MG tablet Commonly known as:  GLUCOPHAGE   valsartan 80 MG tablet Commonly known as:  DIOVAN     TAKE these medications   cyclobenzaprine 10 MG tablet Commonly known as:  FLEXERIL Take 10 mg by mouth 3 (three) times daily as needed for muscle spasms.   estradiol 1 MG tablet Commonly known as:  ESTRACE Take 1 mg by mouth daily.   feeding supplement (PRO-STAT SUGAR FREE 64) Liqd Take 30 mLs by mouth 2 (two) times daily.   gabapentin 300 MG capsule Commonly known as:  NEURONTIN Take 1 capsule (300 mg total) by mouth at bedtime. What changed:  how much to take  when to take this  Another medication with the same name was removed. Continue taking this medication, and follow the directions you see here.   levothyroxine 25 MCG tablet Commonly known as:  SYNTHROID, LEVOTHROID Take 25 mcg by mouth daily before breakfast.   NASACORT ALLERGY 24HR 55 MCG/ACT Aero nasal inhaler Generic drug:  triamcinolone Place 2 sprays into the nose daily as needed (for congestion).   omeprazole 20 MG capsule Commonly known as:  PRILOSEC Take 1 capsule (20 mg total) by mouth 2 (two) times daily before a meal.   OXcarbazepine 150 MG tablet Commonly known as:  TRILEPTAL Take one tablet four times daily. What changed:  how much to take  how to take this  when to take this  additional instructions   Oxycodone HCl 20 MG Tabs Take 1 tablet (20 mg total) by mouth 3 (three) times daily as needed (for pain).   promethazine 25 MG tablet Commonly known as:  PHENERGAN Take 25 mg by mouth every 6 (six) hours as needed for nausea or  vomiting.   temazepam 30 MG capsule Commonly known as:  RESTORIL Take 30 mg by mouth at bedtime as needed for sleep.   Vitamin D (Ergocalciferol) 50000 units Caps capsule Commonly known as:  DRISDOL Take 50,000 Units by mouth once a week.       Vitals:   05/07/16 0436 05/07/16 0821  BP: 116/70 125/75  Pulse: (!) 112 (!) 103  Resp: 19 18  Temp: 97.6 F (36.4 C) 97.9 F (36.6 C)    Skin clean, dry and intact without evidence of skin break down, no evidence of skin tears noted. IV catheter discontinued intact. Site without signs and symptoms of complications. Dressing and pressure applied. Pt denies pain at this time. No complaints noted.  An After Visit Summary was printed and given to the patient. Patient escorted via stretcher, and D/C via ambulance auto.  Retta Mac BSN, RN

## 2016-05-07 NOTE — [place] FL2 (Signed)
[place] MEDICAID FL2 LEVEL OF CARE SCREENING TOOL     IDENTIFICATION  Patient Name: [name] Birthdate: 07-12-1951 Sex: female Admission Date (Current Location): 05/03/2016  [facility] and [place] Number:  [employer] and Address:  [facility] [place], [place] [address], [place], [place] [address]      Provider Number: [id number]  Attending Physician Name and Address:  Reyne Dumas, MD  Relative Name and Phone Number:  Tobey Grim - sister.  Phone [phone]    Current Level of Care: Hospital Recommended Level of Care: [place] Prior Approval Number:    Date Approved/Denied:   PASRR Number:   [id number]  Eff. 05/07/16  Discharge Plan: SNF    Current Diagnoses: Patient Active Problem List   Diagnosis Date Noted  . Acute renal failure (ARF) ([place])   . Fall   . Protein-calorie malnutrition, severe ([place]) 05/05/2016  . AKI (acute kidney injury) ([place]) 05/03/2016  . Hyperkalemia 05/03/2016  . Hypothyroidism 05/03/2016  . Hypertension 05/03/2016  . Rhabdomyolysis 05/03/2016  . Type 2 diabetes mellitus ([place]) 05/03/2016  . Anemia 05/03/2016  . GERD (gastroesophageal reflux disease) 05/03/2016  . Fibromyalgia 05/03/2016  . Hyponatremia 05/03/2016  . Weakness 05/03/2016  . Neck pain 02/16/2016  . Muscle cramp 11/09/2015  . Left-sided weakness 08/29/2015  . Spondylolisthesis of lumbar region 02/08/2015    Orientation RESPIRATION BLADDER Height & Weight     Self, Time, Situation, Place  Normal Continent Weight: 184 lb 11.9 oz (83.8 kg) Height:  5\' 2"  (157.5 cm)  BEHAVIORAL SYMPTOMS/MOOD NEUROLOGICAL BOWEL NUTRITION STATUS      Continent Diet (Low sodium - Heart healthy)  AMBULATORY STATUS COMMUNICATION OF NEEDS Skin   Limited Assist Verbally Normal                       Personal Care Assistance Level of Assistance  Bathing, Feeding, Dressing Bathing Assistance: Limited assistance Feeding assistance:  Independent Dressing Assistance: Limited assistance     Functional Limitations Info  Sight, Hearing, Speech Sight Info: Adequate Hearing Info: Adequate Speech Info: Adequate    SPECIAL CARE FACTORS FREQUENCY  PT (By licensed PT)     PT Frequency: Evaluated 7/30 and a minimum of 3X per week therapy recommended              Contractures Contractures Info: Not present    Additional Factors Info  Code Status, Allergies Code Status Info: Full Allergies Info: Formaldehyde, Altace, Morphine and related           Current Medications (05/07/2016):  This is the current hospital active medication list Current Facility-Administered Medications  Medication Dose Route Frequency Provider Last Rate Last Dose  . cyclobenzaprine (FLEXERIL) tablet 10 mg  10 mg Oral TID PRN [name], MD      . enoxaparin (LOVENOX) injection 40 mg  40 mg Subcutaneous Q24H Jani Gravel, MD   40 mg at 05/06/16 2123  . estradiol (ESTRACE) tablet 1 mg  1 mg Oral Daily [name], MD   1 mg at 05/07/16 0858  . feeding supplement (PRO-STAT SUGAR FREE 64) liquid 30 mL  30 mL Oral BID Jani Gravel, MD   30 mL at 05/07/16 0858  . gabapentin (NEURONTIN) capsule 200 mg  200 mg Oral TID Jani Gravel, MD   200 mg at 05/07/16 0856  . insulin aspart (novoLOG) injection 0-9 Units  0-9 Units Subcutaneous TID WC [name], MD  2 Units at 05/04/16 1218  . levothyroxine (SYNTHROID, LEVOTHROID) tablet 25 mcg  25 mcg Oral QAC breakfast [name], MD   25 mcg at 05/07/16 0739  . loratadine (CLARITIN) tablet 10 mg  10 mg Oral Daily PRN Jani Gravel, MD   10 mg at 05/06/16 1103  . LORazepam (ATIVAN) injection 1 mg  1 mg Intravenous Once [name], MD      . ondansetron [facility]) tablet 4 mg  4 mg Oral Q6H PRN [name], MD       Or  . ondansetron [facility]) injection 4 mg  4 mg Intravenous Q6H PRN [name], MD   4 mg at 05/06/16 1553  . OXcarbazepine (TRILEPTAL) tablet 150 mg  150 mg  Oral BID Jani Gravel, MD   150 mg at 05/07/16 0857  . oxyCODONE (Oxy IR/ROXICODONE) immediate release tablet 20 mg  20 mg Oral TID PRN [name], MD   20 mg at 05/07/16 0744  . pantoprazole (PROTONIX) EC tablet 40 mg  40 mg Oral Daily [name], MD   40 mg at 05/07/16 0858  . promethazine (PHENERGAN) tablet 25 mg  25 mg Oral Q6H PRN [name], MD      . sodium chloride flush (NS) 0.9 % injection 3 mL  3 mL Intravenous Q12H [name], MD   3 mL at 05/07/16 1000  . temazepam (RESTORIL) capsule 30 mg  30 mg Oral QHS PRN [name], MD   30 mg at 05/03/16 2130  . triamcinolone (NASACORT) nasal inhaler 2 spray  2 spray Nasal Daily PRN [name], MD         Discharge Medications: Please see discharge summary for a list of discharge medications.  Relevant Imaging Results:  Relevant Lab Results:   Additional Information ss#[id number].  Sable Feil, LCSW

## 2016-05-07 NOTE — Clinical Social Work Placement (Addendum)
   CLINICAL SOCIAL WORK PLACEMENT  NOTE 05/07/16 - DISCHARGED TO [facility]  Date:  05/07/2016  Patient Details  Name: [name] MRN: [id number] Date of Birth: 27-Mar-1951  Clinical Social Work is seeking post-discharge placement for this patient at the [place] level of care (*CSW will initial, date and re-position this form in  chart as items are completed):  No   Patient/family provided with [facility] Work Department's list of facilities offering this level of care within the geographic area requested by the patient (or if unable, by the patient's family).  Yes   Patient/family informed of their freedom to choose among providers that offer the needed level of care, that participate in Medicare, Medicaid or managed care program needed by the patient, have an available bed and are willing to accept the patient.  Yes   Patient/family informed of [facility]'s ownership interest in [facility] and [facility], as well as of the fact that they are under no obligation to receive care at these facilities.  PASRR submitted to EDS on 05/07/16     PASRR number received on 05/07/16     Existing PASRR number confirmed on       FL2 transmitted to all facilities in geographic area requested by pt/family on 05/07/16     FL2 transmitted to all facilities within larger geographic area on       Patient informed that his/her managed care company has contracts with or will negotiate with certain facilities, including the following:         05/07/16 - Patient/family informed of bed offers received.  Patient chooses bed at  [facility]     Physician recommends and patient chooses bed at      Patient to be transferred to  [facility] on  05/07/16.  Patient to be transferred to facility by  ambulance     Patient family notified on  05/07/16 of transfer.  Name of family member notified:   [name] by phone     PHYSICIAN        Additional Comment:    _______________________________________________ Sable Feil, LCSW 05/07/2016, 2:17 PM

## 2016-05-07 NOTE — Consult Note (Signed)
Physical Medicine and Rehabilitation Consult   Reason for Consult: Gait disorder Referring Physician: Dr. Allyson Sabal   HPI: [name] is a 65 y.o. female with history of chronic cervical stenosis and LBP with lumbar radiculopathy and gait disorder, T2DM, fibromyalgia, 2-3 weeks history of recent falls with onset of tremors and was admitted on 05/03/16 after fall and she laid in her closet floor for 2 days. She reports recent falls due to syncope as well as recent treatment with steriods for Lichen planus felt caused tremors. She was able to activate EMS next day and was found to have AKI with hyperkalemia, rhabomyolysis and tachycardia. She was treated with IVF as well as insulin and kayexalate for AKI with improvement. Neurology consulted for input and recommended therapy for gait disorder and exam with enhanced physiological tremors.    She was getting ESI by Dr. Nelva Bush and feels that back surgery last year did not help much in pain control.  Goes to [facility] for Pain management and goes back and forth for past 4 years. She has family in [place].      Review of Systems  HENT: Negative for hearing loss.   Eyes: Negative for blurred vision and double vision.  Respiratory: Positive for shortness of breath.   Cardiovascular: Negative for chest pain and palpitations.  Gastrointestinal: Positive for constipation, heartburn, nausea and vomiting.  Genitourinary: Negative for dysuria and urgency.  Musculoskeletal: Positive for back pain, falls (RLE give away), joint pain (right hip pain since fall), myalgias and neck pain.  Neurological: Negative for dizziness.  Psychiatric/Behavioral: The patient has insomnia.   All other systems reviewed and are negative.     Past Medical History:  Diagnosis Date  . Anemia   . Cataracts, bilateral   . Chronic back pain    scoliosis/displacement of lumbosacral intervertebral disc/stenosis  . Depression    but doens't take any meds  . Diabetes  mellitus without complication ([place])    takes Metformin daily  . Family history of adverse reaction to anesthesia    pts mom would get short of breath after anesthesia but was a smoker  . Fibromyalgia    takes Gabapentin daily  . Fusion of lumbar spine   . GERD (gastroesophageal reflux disease)    takes Nexium daily  . Heart murmur   . Hemorrhoids   . History of blood transfusion 2013   no abnormal reaction noted  . History of bronchitis 2009  . History of colon polyps    benign  . History of hiatal hernia   . History of kidney stones    has a kidney stone now  . History of migraine    hasn't had one in over a yr  . History of surgery on arm    plates and screws  . Hypertension   . Hypothyroidism    takes Synthroid daily  . Idiopathic thrombocytopenia ([place])   . Insomnia    takes Restoril nightly as needed   . Memory loss   . Muscle spasm    takes Flexeril daily as needed  . Numbness and tingling    both toes  . PONV (postoperative nausea and vomiting)   . Shortness of breath dyspnea    with exertion but states its bc she can't exercise  . Urinary frequency   . Vitamin D deficiency     Past Surgical History:  Procedure Laterality Date  . ABDOMINAL HYSTERECTOMY    . CARDIAC CATHETERIZATION  56yrs  ago  . CATARACT EXTRACTION, BILATERAL    . CERVICAL FUSION    . COLONOSCOPY    . ESOPHAGOGASTRODUODENOSCOPY    . GASTRIC BYPASS    . JOINT REPLACEMENT Bilateral   . kidney stone removed    . KNEE ARTHROSCOPY     total of 7 between both knees  . left arm surgery     pins  . LUMBAR FUSION    . TONSILLECTOMY    . tummy tuck       Family History  Problem Relation Age of Onset  . Lung cancer Mother   . COPD Mother   . Hypertension Mother   . Depression Mother   . Lymphoma Paternal Uncle   . Diabetes Maternal Grandmother     x 5 uncles  . 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  She reports that she has  never smoked. She has never used smokeless tobacco. She reports that she does not drink alcohol or use drugs.   Allergies  Allergen Reactions  . Formaldehyde     Other reaction(s): Shortness Of Breath, coughing  . Altace [Ramipril] Cough  . Dilaudid [Hydromorphone] Itching    Drip OK, pills cause itching    Medications Prior to Admission  Medication Sig Dispense Refill  . cyclobenzaprine (FLEXERIL) 10 MG tablet Take 10 mg by mouth 3 (three) times daily as needed for muscle spasms.    Marland Kitchen estradiol (ESTRACE) 1 MG tablet Take 1 mg by mouth daily.    Marland Kitchen gabapentin (NEURONTIN) 600 MG tablet Take 600 mg by mouth 3 (three) times daily.    Marland Kitchen levothyroxine (SYNTHROID, LEVOTHROID) 25 MCG tablet Take 25 mcg by mouth daily before breakfast.    . meloxicam (MOBIC) 7.5 MG tablet Take 1 tablet (7.5 mg total) by mouth daily.    . metFORMIN (GLUCOPHAGE) 1000 MG tablet Take 1,000 mg by mouth 2 (two) times daily with a meal.    . omeprazole (PRILOSEC) 20 MG capsule Take 1 capsule (20 mg total) by mouth 2 (two) times daily before a meal. 60 capsule 6  . OXcarbazepine (TRILEPTAL) 150 MG tablet Take one tablet four times daily. (Patient taking differently: Take 150 mg by mouth 4 (four) times daily. Take one tablet four times daily.) 120 tablet 11  . oxyCODONE 20 MG TABS Take 1 tablet (20 mg total) by mouth every 4 (four) hours as needed for moderate pain. (Patient taking differently: Take 20 mg by mouth 3 (three) times daily as needed (for pain). ) 100 tablet 0  . promethazine (PHENERGAN) 25 MG tablet Take 25 mg by mouth every 6 (six) hours as needed for nausea or vomiting.    . temazepam (RESTORIL) 30 MG capsule Take 30 mg by mouth at bedtime as needed for sleep.     Marland Kitchen triamcinolone (NASACORT ALLERGY 24HR) 55 MCG/ACT AERO nasal inhaler Place 2 sprays into the nose daily as needed (for congestion).    . valsartan (DIOVAN) 80 MG tablet Take 1 tablet (80 mg total) by mouth daily.    . Vitamin D, Ergocalciferol,  (DRISDOL) 50000 units CAPS capsule Take 50,000 Units by mouth once a week.    . [DISCONTINUED] gabapentin (NEURONTIN) 300 MG capsule Take 300-600 mg by mouth 3 (three) times daily.  0    Home: Home Living Family/patient expects to be discharged to:: Private residence Living Arrangements: Alone Available Help at Discharge: Family ([place] who may  be able to drive her ) Type of Home: House Home Access: Stairs to enter [employer] of Steps: 2 Entrance Stairs-Rails: Can reach both Home Layout: One level Bathroom Shower/Tub: Multimedia programmer: Standard Bathroom Accessibility: Yes Home Equipment: Grab bars - tub/shower, Shower seat  Functional History: Prior Function Level of Independence: Independent Functional Status:  Mobility: Bed Mobility Overal bed mobility: Modified Independent (verbal cues) General bed mobility comments: Verbal cues for hand placement and adjustments Transfers Overall transfer level: Needs assistance Equipment used: Rolling walker (2 wheeled) Transfers: Sit to/from Stand Sit to Stand: Min guard General transfer comment: verbal cue for hand placement, push from bed, place hands on walker  Ambulation/Gait Ambulation/Gait assistance: Min guard Ambulation Distance (Feet): 150 Feet Assistive device: Rolling walker (2 wheeled) Gait Pattern/deviations: Antalgic, Decreased stride length, Decreased weight shift to left, Step-to pattern General Gait Details: Pt heavily relied on UEs. Reluctant to place more weight on left leg due to pain. Encouraged deep breathing for relaxation due to pain. Pt unsteady when hands removed from walker. Trunk slightly flexed.  Gait velocity interpretation: Below normal speed for age/gender    ADL:    Cognition: Cognition Overall Cognitive Status: Within Functional Limits for tasks assessed Orientation Level: Oriented X4 Cognition Arousal/Alertness: Awake/alert Behavior During Therapy: WFL for tasks  assessed/performed Overall Cognitive Status: Within Functional Limits for tasks assessed  Blood pressure 125/75, pulse (!) 103, temperature 97.9 F (36.6 C), temperature source Oral, resp. rate 18, height 5\' 2"  (1.575 m), weight 83.8 kg (184 lb 11.9 oz), SpO2 97 %. Physical Exam  Nursing note and vitals reviewed. Constitutional: She is oriented to person, place, and time. She appears well-developed and well-nourished.  Wincing in pain during exam  HENT:  Head: Normocephalic and atraumatic.  Mouth/Throat: Oropharynx is clear and moist.  Eyes: Conjunctivae and EOM are normal. Pupils are equal, round, and reactive to light.  Neck: Normal range of motion. Neck supple.  Cardiovascular: Normal rate and regular rhythm.   Murmur heard. Respiratory: Effort normal and breath sounds normal. Stridor present. No respiratory distress. She has no wheezes.  GI: Soft. Bowel sounds are normal. She exhibits no distension. There is no tenderness.  Musculoskeletal: Normal range of motion. She exhibits edema and tenderness.  1-2+ edema BLE and LUE>RUE.   Neurological: She is alert and oriented to person, place, and time.  Verbose.  Speech clear.  Able to follow commands without difficulty.  Sensation intact to light touch DTRs symmetric Motor: B/l UE: shoulder abduction, elbow flexion/extension 4/5, hand grip 4+/5 (?effort/pain inhibition) B/l LE: 4-/5 hip flexion, knee extension, 4+/5 ankle dorsi/plantarflexion (?effort/pain inhibition)  Skin: Skin is warm and dry. No rash noted. No erythema.  Psychiatric: Her mood appears anxious. Her speech is tangential.    Results for orders placed or performed during the hospital encounter of 05/03/16 (from the past 24 hour(s))  Glucose, capillary     Status: Abnormal   Collection Time: 05/06/16  5:17 PM  Result Value Ref Range   Glucose-Capillary 118 (H) 65 - 99 mg/dL  Glucose, capillary     Status: Abnormal   Collection Time: 05/06/16  8:43 PM  Result Value  Ref Range   Glucose-Capillary 164 (H) 65 - 99 mg/dL  CBC     Status: Abnormal   Collection Time: 05/07/16  5:24 AM  Result Value Ref Range   WBC 3.5 (L) 4.0 - 10.5 K/uL   RBC 3.21 (L) 3.87 - 5.11 MIL/uL   Hemoglobin 9.4 (L) 12.0 -  15.0 g/dL   HCT 29.8 (L) 36.0 - 46.0 %   MCV 92.8 78.0 - 100.0 fL   MCH 29.3 26.0 - 34.0 pg   MCHC 31.5 30.0 - 36.0 g/dL   RDW 15.4 11.5 - 15.5 %   Platelets 144 (L) 150 - 400 K/uL  Comprehensive metabolic panel     Status: Abnormal   Collection Time: 05/07/16  5:24 AM  Result Value Ref Range   Sodium 136 135 - 145 mmol/L   Potassium 3.9 3.5 - 5.1 mmol/L   Chloride 110 101 - 111 mmol/L   CO2 23 22 - 32 mmol/L   Glucose, Bld 120 (H) 65 - 99 mg/dL   BUN 22 (H) 6 - 20 mg/dL   Creatinine, Ser 1.34 (H) 0.44 - 1.00 mg/dL   Calcium 7.9 (L) 8.9 - 10.3 mg/dL   Total Protein 3.9 (L) 6.5 - 8.1 g/dL   Albumin 1.8 (L) 3.5 - 5.0 g/dL   AST 34 15 - 41 U/L   ALT 43 14 - 54 U/L   Alkaline Phosphatase 100 38 - 126 U/L   Total Bilirubin 0.7 0.3 - 1.2 mg/dL   GFR calc non Af Amer 40 (L) >60 mL/min   GFR calc Af Amer 47 (L) >60 mL/min   Anion gap 3 (L) 5 - 15  Sedimentation rate     Status: None   Collection Time: 05/07/16  5:24 AM  Result Value Ref Range   Sed Rate 2 0 - 22 mm/hr  Glucose, capillary     Status: Abnormal   Collection Time: 05/07/16  7:57 AM  Result Value Ref Range   Glucose-Capillary 108 (H) 65 - 99 mg/dL  Glucose, capillary     Status: Abnormal   Collection Time: 05/07/16 12:02 PM  Result Value Ref Range   Glucose-Capillary 105 (H) 65 - 99 mg/dL   US Renal  Result Date: 05/05/2016 CLINICAL DATA:  Acute renal failure.  Diabetic patient. EXAM: RENAL / URINARY TRACT ULTRASOUND COMPLETE COMPARISON:  None. FINDINGS: Right Kidney: Length: 9.1 cm. Normal parenchymal echogenicity. Mild renal cortical thinning. No mass, stone or hydronephrosis. Left Kidney: Length: 10.8 cm. Echogenicity within normal limits. No mass or hydronephrosis visualized.  Bladder: Appears normal for degree of bladder distention. IMPRESSION: 1. No acute finding.  No hydronephrosis. 2. Mild right renal cortical thinning.  No other abnormality. Electronically Signed   By: Lajean Manes M.D.   On: 05/05/2016 22:47   Assessment/Plan: Diagnosis: Debility Labs and images independently reviewed.  Records reviewed and summated above.  1. Does the need for close, 24 hr/day medical supervision in concert with the patient's rehab needs make it unreasonable for this patient to be served in a less intensive setting? No Co-Morbidities requiring supervision/potential complications: chronic cervical stenosis and LBP with lumbar radiculopathy (Biofeedback training with therapies to help reduce reliance on opiate pain medications, monitor pain control during therapies, and sedation at rest and titrate to maximum efficacy to ensure participation and gains in therapies), gait disorder (cont therapies), T2DM (Monitor in accordance with exercise and adjust meds as necessary), fibromyalgia (cont meds), AKI (avoid nephrotoxic meds), hyperkalemia (cont to monitor, treat if necessary), rhabomyolysis (cont to monitor), pancytopenia (cont to monitor, likely dilutional), falls (?polypharmacy, wean as tolerated), obesity (dietitian consult, encourage weight loss) 2. Due to safety, skin/wound care, pain management and patient education, does the patient require 24 hr/day rehab nursing? Yes 3. Does the patient require coordinated care of a physician, rehab nurse, PT (1-2 hrs/day, 5  days/week) and OT (1-2 hrs/day, 5 days/week) to address physical and functional deficits in the context of the above medical diagnosis(es)? Potentially Addressing deficits in the following areas: balance, endurance, locomotion, strength, transferring, toileting and psychosocial support 4. Can the patient actively participate in an intensive therapy program of at least 3 hrs of therapy per day at least 5 days per week?  Yes 5. The potential for patient to make measurable gains while on inpatient rehab is good and fair 6. Anticipated functional outcomes upon discharge from inpatient rehab are n/a  with PT, n/a with OT, n/a with SLP. 7. Estimated rehab length of stay to reach the above functional goals is: NA 8. Does the patient have adequate social supports and living environment to accommodate these discharge functional goals? N/A 9. Anticipated D/C setting: SNF 10. Anticipated post D/C treatments: SNF 11. Overall Rehab/Functional Prognosis: good  RECOMMENDATIONS: This patient's condition is appropriate for continued rehabilitative care in the following setting: Pt high functioning, ambulating 17ft on day of eval.  If unable to return home for safety concens and lack of support, recommend SNF in [place] (per pt preference). Patient has agreed to participate in recommended program. Potentially Note that insurance prior authorization may be required for reimbursement for recommended care.  Comment: Rehab Admissions Coordinator to follow up.  Delice Lesch, MD 05/07/2016

## 2016-05-08 ENCOUNTER — Non-Acute Institutional Stay (SKILLED_NURSING_FACILITY): Payer: Medicare Other | Admitting: Internal Medicine

## 2016-05-08 ENCOUNTER — Encounter: Payer: Self-pay | Admitting: Internal Medicine

## 2016-05-08 ENCOUNTER — Encounter: Payer: Self-pay | Admitting: Nurse Practitioner

## 2016-05-08 ENCOUNTER — Other Ambulatory Visit: Payer: Self-pay

## 2016-05-08 DIAGNOSIS — R251 Tremor, unspecified: Secondary | ICD-10-CM

## 2016-05-08 DIAGNOSIS — D696 Thrombocytopenia, unspecified: Secondary | ICD-10-CM | POA: Diagnosis not present

## 2016-05-08 DIAGNOSIS — E119 Type 2 diabetes mellitus without complications: Secondary | ICD-10-CM

## 2016-05-08 DIAGNOSIS — E46 Unspecified protein-calorie malnutrition: Secondary | ICD-10-CM

## 2016-05-08 DIAGNOSIS — R531 Weakness: Secondary | ICD-10-CM | POA: Diagnosis not present

## 2016-05-08 DIAGNOSIS — N179 Acute kidney failure, unspecified: Secondary | ICD-10-CM

## 2016-05-08 DIAGNOSIS — E669 Obesity, unspecified: Secondary | ICD-10-CM

## 2016-05-08 DIAGNOSIS — T796XXS Traumatic ischemia of muscle, sequela: Secondary | ICD-10-CM | POA: Diagnosis not present

## 2016-05-08 DIAGNOSIS — E039 Hypothyroidism, unspecified: Secondary | ICD-10-CM

## 2016-05-08 DIAGNOSIS — M797 Fibromyalgia: Secondary | ICD-10-CM

## 2016-05-08 DIAGNOSIS — M4316 Spondylolisthesis, lumbar region: Secondary | ICD-10-CM

## 2016-05-08 DIAGNOSIS — I10 Essential (primary) hypertension: Secondary | ICD-10-CM

## 2016-05-08 DIAGNOSIS — K219 Gastro-esophageal reflux disease without esophagitis: Secondary | ICD-10-CM

## 2016-05-08 DIAGNOSIS — R600 Localized edema: Secondary | ICD-10-CM | POA: Diagnosis not present

## 2016-05-08 DIAGNOSIS — F418 Other specified anxiety disorders: Secondary | ICD-10-CM | POA: Diagnosis not present

## 2016-05-08 DIAGNOSIS — E1169 Type 2 diabetes mellitus with other specified complication: Secondary | ICD-10-CM

## 2016-05-08 DIAGNOSIS — D649 Anemia, unspecified: Secondary | ICD-10-CM

## 2016-05-08 MED ORDER — OXYCODONE HCL 20 MG PO TABS: 20.0000 mg | ORAL_TABLET | Freq: Three times a day (TID) | ORAL | 0 refills | Status: DC | PRN

## 2016-05-08 MED ORDER — TEMAZEPAM 30 MG PO CAPS: 30.0000 mg | ORAL_CAPSULE | Freq: Every evening | ORAL | 0 refills | Status: DC | PRN

## 2016-05-08 NOTE — Telephone Encounter (Signed)
Prescription request was received from:  [facility] [address] [place] [address]  Phone: [phone]  Fax: [phone]  

## 2016-05-08 NOTE — Progress Notes (Signed)
LOCATION: [facility]  PCP: Joya Gaskins, MD   Code Status: DNR  Goals of care: Advanced Directive information Advanced Directives 05/08/2016  Does patient have an advance directive? Yes  Type of Advance Directive Out of facility DNR (pink MOST or yellow form)  Does patient want to make changes to advanced directive? No - Patient declined  Copy of advanced directive(s) in chart? Yes  Would patient like information on creating an advanced directive? -       Extended Emergency Contact Information Primary Emergency Contact: Burke,Candy Address: [place], [place] [address] [place] Phone: [phone] Work Phone: [phone] Mobile Phone: [phone] Relation: Sister   Allergies  Allergen Reactions  . Formaldehyde     Other reaction(s): Shortness Of Breath, coughing  . Altace [Ramipril] Cough  . Dilaudid [Hydromorphone] Itching    Drip OK, pills cause itching    Chief Complaint  Patient presents with  . New Admit To SNF    New Admission     HPI:  Patient is a 65 y.o. female seen today for short term rehabilitation post hospital admission from 05/03/16-05/07/16 post fall with rhabdomylosis, acute renal failure, electrolyte imbalance and possible adrenal insufficiency. She received iv fluids, oral kayexalate, insulin with dextrose. She had suddenly stopped her prednisone 40 mg daily after taking it for 6 weeks and this was thought to have caused her sudden onset weakness and tremors. She had tremors thought to be enhanced physiological tremor after neurology consult. She had acute urinary retention that resolved at time of discharge with withholding some medications and giving iv fluids. Her nephrotoxic drugs were held. She is seen in her room today. She lives in [place] and gets her primary care at [facility] and her medical pharmacy is in [place], [place]. She jumps from one topic to another during conversation and needs redirection.     Review of Systems:  Constitutional: Negative for fever, chills. Feels weak and tired.  HENT: Negative for congestion, nasal discharge, hearing loss, sore throat, difficulty swallowing. Positive for occasional headaches.   Eyes: Negative for blurred vision, double vision and discharge.  Respiratory: Negative for cough, shortness of breath and wheezing.   Cardiovascular: Negative for chest pain, palpitations. Positive for leg swelling.  Gastrointestinal: Negative for heartburn, nausea, vomiting, abdominal pain. Does not remember her last bowel movement but thinks she had one in the hospital.  Genitourinary: Negative for dysuria and flank pain.  Musculoskeletal: Negative for fall in the facility. Has chronic back pain.   Skin: Negative for itching, rash.  Neurological: Negative for dizziness. Positive for weakness mainly to her legs. Psychiatric/Behavioral: Positive for depression and anxiety.   Past Medical History:  Diagnosis Date  . Anemia   . Cataracts, bilateral   . Chronic back pain    scoliosis/displacement of lumbosacral intervertebral disc/stenosis  . Depression    but doens't take any meds  . Diabetes mellitus without complication ([place])    takes Metformin daily  . Family history of adverse reaction to anesthesia    pts mom would get short of breath after anesthesia but was a smoker  . Fibromyalgia    takes Gabapentin daily  . Fusion of lumbar spine   . GERD (gastroesophageal reflux disease)    takes Nexium daily  . Heart murmur   . Hemorrhoids   . History of blood transfusion 2013   no abnormal reaction noted  . History of bronchitis 2009  .  History of colon polyps    benign  . History of hiatal hernia   . History of kidney stones    has a kidney stone now  . History of migraine    hasn't had one in over a yr  . History of surgery on arm    plates and screws  . Hypertension   . Hypothyroidism    takes Synthroid daily  . Idiopathic thrombocytopenia ([place])   .  Insomnia    takes Restoril nightly as needed   . Memory loss   . Muscle spasm    takes Flexeril daily as needed  . Numbness and tingling    both toes  . PONV (postoperative nausea and vomiting)   . Shortness of breath dyspnea    with exertion but states its bc she can't exercise  . Urinary frequency   . Vitamin D deficiency    Past Surgical History:  Procedure Laterality Date  . ABDOMINAL HYSTERECTOMY    . CARDIAC CATHETERIZATION  38yrs ago  . CATARACT EXTRACTION, BILATERAL    . CERVICAL FUSION    . COLONOSCOPY    . ESOPHAGOGASTRODUODENOSCOPY    . GASTRIC BYPASS    . JOINT REPLACEMENT Bilateral   . kidney stone removed    . KNEE ARTHROSCOPY     total of 7 between both knees  . left arm surgery     pins  . LUMBAR FUSION    . TONSILLECTOMY    . tummy tuck      Social History:   reports that she has never smoked. She has never used smokeless tobacco. She reports that she does not drink alcohol or use drugs.  Family History  Problem Relation Age of Onset  . Lung cancer Mother   . COPD Mother   . Hypertension Mother   . Depression Mother   . Lymphoma Paternal Uncle   . Diabetes Maternal Grandmother     x 5 uncles  . Heart disease      Maternal and Paternal    Medications:   Medication List       Accurate as of 05/08/16  2:37 PM. Always use your most recent med list.          cyclobenzaprine 10 MG tablet Commonly known as:  FLEXERIL Take 10 mg by mouth 3 (three) times daily as needed for muscle spasms.   estradiol 1 MG tablet Commonly known as:  ESTRACE Take 1 mg by mouth daily.   feeding supplement (PRO-STAT SUGAR FREE 64) Liqd Take 30 mLs by mouth 2 (two) times daily.   gabapentin 300 MG capsule Commonly known as:  NEURONTIN Take 1 capsule (300 mg total) by mouth at bedtime.   levothyroxine 25 MCG tablet Commonly known as:  SYNTHROID, LEVOTHROID Take 25 mcg by mouth daily before breakfast.   NASACORT ALLERGY 24HR 55 MCG/ACT Aero nasal  inhaler Generic drug:  triamcinolone Place 2 sprays into the nose daily as needed (for congestion).   omeprazole 20 MG capsule Commonly known as:  PRILOSEC Take 1 capsule (20 mg total) by mouth 2 (two) times daily before a meal.   OXcarbazepine 150 MG tablet Commonly known as:  TRILEPTAL Take one tablet four times daily.   Oxycodone HCl 20 MG Tabs Take 1 tablet (20 mg total) by mouth 3 (three) times daily as needed (for pain).   promethazine 25 MG tablet Commonly known as:  PHENERGAN Take 25 mg by mouth every 6 (six) hours as needed for nausea  or vomiting.   temazepam 30 MG capsule Commonly known as:  RESTORIL Take 1 capsule (30 mg total) by mouth at bedtime as needed for sleep.   Vitamin D (Ergocalciferol) 50000 units Caps capsule Commonly known as:  DRISDOL Take 50,000 Units by mouth once a week.       Immunizations:  There is no immunization history on file for this patient.   Physical Exam: Vitals:   05/08/16 1352  BP: (!) 164/74  Pulse: 88  Resp: 18  Temp: 98.4 F (36.9 C)  TempSrc: Oral  Weight: 184 lb (83.5 kg)  Height: 5\' 2"  (1.575 m)   Body mass index is 33.65 kg/m.  General- elderly female, obese, in no acute distress Head- normocephalic, atraumatic Nose- no maxillary or frontal sinus tenderness, no nasal discharge Throat- moist mucus membrane Eyes- PERRLA, EOMI, no pallor, no icterus, no discharge, normal conjunctiva, normal sclera Neck- no cervical lymphadenopathy Cardiovascular- normal s1,s2, + murmur Respiratory- bilateral clear to auscultation, no wheeze, no rhonchi, no crackles, no use of accessory muscles Abdomen- bowel sounds present, soft, non tender Musculoskeletal- able to move all 4 extremities, generalized weakness most prominent to her RLE, trace leg edema present Neurological-  alert and oriented to person, place and time, has flights of ideas and easily distracted, mild tremor to her hands Skin- warm and dry, erythematous area to  her LLE with clear drainage from swollen leg area, no tenderness or induration or warmth noted, bruise to her arms Psychiatry- tearful during conversation, anxious    Labs reviewed: Basic Metabolic Panel:  Recent Labs  05/03/16 1948  05/05/16 0536 05/06/16 0537 05/07/16 0524  NA  --   < > 132* 136 136  K  --   < > 3.8 3.8 3.9  CL  --   < > 108 112* 110  CO2  --   < > 18* 18* 23  GLUCOSE  --   < > 95 105* 120*  BUN  --   < > 27* 25* 22*  CREATININE  --   < > 1.81* 1.29* 1.34*  CALCIUM  --   < > 7.2* 7.6* 7.9*  MG 1.7  --   --   --   --   PHOS 5.0*  --   --   --   --   < > = values in this interval not displayed. Liver Function Tests:  Recent Labs  05/05/16 0536 05/06/16 0537 05/07/16 0524  AST 49* 35 34  ALT 31 34 43  ALKPHOS 89 92 100  BILITOT 0.6 0.4 0.7  PROT 3.4* 3.4* 3.9*  ALBUMIN 1.7* 1.7* 1.8*    Recent Labs  05/03/16 1352  LIPASE 11   No results for input(s): AMMONIA in the last 8760 hours. CBC:  Recent Labs  05/04/16 0658 05/06/16 0537 05/07/16 0524  WBC 7.4 4.5 3.5*  NEUTROABS 4.8  --   --   HGB 9.5* 10.0* 9.4*  HCT 29.0* 32.0* 29.8*  MCV 90.1 92.2 92.8  PLT 190 160 144*   Cardiac Enzymes:  Recent Labs  05/03/16 1352 05/06/16 0537  CKTOTAL 545* 385*   BNP: Invalid input(s): POCBNP CBG:  Recent Labs  05/06/16 2043 05/07/16 0757 05/07/16 1202  GLUCAP 164* 108* 105*    Radiological Exams: Dg Chest 2 View  Result Date: 05/03/2016 CLINICAL DATA:  Fall 2 days ago with shortness of breath, initial encounter EXAM: CHEST  2 VIEW COMPARISON:  12/28/2010 FINDINGS: Cardiac shadow is within normal limits. The lungs  are well aerated bilaterally. No focal infiltrate or sizable effusion is seen. Postsurgical changes in the cervical spine are noted. No acute bony abnormality is seen. IMPRESSION: No active cardiopulmonary disease. Electronically Signed   By: Inez Catalina M.D.   On: 05/03/2016 17:46  Dg Shoulder Right  Result Date:  05/03/2016 CLINICAL DATA:  Fall 2 days ago with persistent right shoulder pain, initial encounter EXAM: RIGHT SHOULDER - 2+ VIEW COMPARISON:  None. FINDINGS: Acromioclavicular degenerative changes are noted. No fracture or dislocation is seen. The underlying bony thorax is within normal limits. Postsurgical changes are noted in the cervical spine. IMPRESSION: No acute abnormality noted. Electronically Signed   By: Inez Catalina M.D.   On: 05/03/2016 17:50  Ct Head Wo Contrast  Result Date: 05/03/2016 CLINICAL DATA:  Fall, weakness. EXAM: CT HEAD WITHOUT CONTRAST TECHNIQUE: Contiguous axial images were obtained from the base of the skull through the vertex without intravenous contrast. COMPARISON:  None. FINDINGS: Brain: Ventricles are within normal limits in size and configuration. All areas of the brain demonstrate normal gray-white matter attenuation. There is no mass, hemorrhage, edema or other evidence of acute parenchymal abnormality. No extra-axial hemorrhage. Vascular: No hyperdense vessel or unexpected calcification. There are chronic calcified atherosclerotic changes of the large vessels at the skull base. Skull: Negative for fracture or focal lesion. Sinuses/Orbits: No acute findings. Other: None. IMPRESSION: Negative head CT.  No intracranial mass, hemorrhage or edema. Electronically Signed   By: Franki Cabot M.D.   On: 05/03/2016 18:01  US Renal  Result Date: 05/05/2016 CLINICAL DATA:  Acute renal failure.  Diabetic patient. EXAM: RENAL / URINARY TRACT ULTRASOUND COMPLETE COMPARISON:  None. FINDINGS: Right Kidney: Length: 9.1 cm. Normal parenchymal echogenicity. Mild renal cortical thinning. No mass, stone or hydronephrosis. Left Kidney: Length: 10.8 cm. Echogenicity within normal limits. No mass or hydronephrosis visualized. Bladder: Appears normal for degree of bladder distention. IMPRESSION: 1. No acute finding.  No hydronephrosis. 2. Mild right renal cortical thinning.  No other abnormality.  Electronically Signed   By: Lajean Manes M.D.   On: 05/05/2016 22:47  Dg Foot Complete Right  Result Date: 05/03/2016 CLINICAL DATA:  Recent fall, subsequent encounter EXAM: RIGHT FOOT COMPLETE - 3+ VIEW COMPARISON:  05/01/2016 FINDINGS: No acute fracture or dislocation is noted. No soft tissue changes are seen. Small calcaneal spur is noted. IMPRESSION: No acute abnormality noted. No significant interval change from the study of 2 days previous. Electronically Signed   By: Inez Catalina M.D.   On: 05/03/2016 17:48  Dg Hip Unilat W Or Wo Pelvis 2-3 Views Right  Result Date: 05/03/2016 CLINICAL DATA:  Fall 2 days ago with right hip pain, initial encounter EXAM: DG HIP (WITH OR WITHOUT PELVIS) 2-3V RIGHT COMPARISON:  None. FINDINGS: Pelvic ring is intact. Postsurgical changes are noted in the lower lumbar spine. No acute fracture or dislocation is noted. Mild degenerative changes of the hip joints are seen bilaterally. Diffuse vascular calcifications are seen. IMPRESSION: No acute abnormality noted. Electronically Signed   By: Inez Catalina M.D.   On: 05/03/2016 17:47   Assessment/Plan  Generalized weakness From deconditioning. Will have her work with physical therapy and occupational therapy team to help with gait training and muscle strengthening exercises.fall precautions. Skin care. Encourage to be out of bed.   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 Monitor clinically. Will make neurology follow up.  Acute kidney injury With urinary retention and lyte abnormality. Has good urine output today.  Hydration encouraged. Monitor bmp  rhabdomylosis S/p iv fluids, monitor clinically, fall precautions. Check ck  Lumbar spondylosis Continue oxycodone 20 mg tid prn with flexeril 10 mg tid prn, monitor clinically. Will have patient work with PT/OT as tolerated to regain strength and restore function.  Fall precautions are in place.  Leg edema With clear  drainage, add lasix 20 mg daily with kcl 10 meq daily and check bmp 05/10/16. Monitor the erythematous area. Treatment nurse to provide wound care.   Depression with anxiety Get psychology and psychiatry consult. Avoid benzodiazepine with her recent fall. Start celexa 10 mg po daily x 1 week and then 20 mg daily and monitor  Thrombocytopenia Monitor platelet count and monitor for bleed  Anemia Monitor cbc  Protein calorie malnutrition Get RD consult, encourage po intake. Monitor weight. Continue feeding supplement  Fibromyalgia Continue her gabapentin with trileptal  Hypothyroidism Lab Results  Component Value Date   TSH 2.344 05/03/2016   Continue levothyroxine 25 mcg daily  Type 2 DM with neuropathy Continue gabapentin 300 mg daily for neuropathic pain. Continue glipizide 2.5 mg bid and monitor cbg  HTN Check bp bid for now. Currently not on any antihypertensive. Check bmp  GERD Stable, continue prilosec 20 mg bid   Goals of care: short term rehabilitation   Labs/tests ordered: cbc, cmp, ck 05/09/16  Family/ staff Communication: reviewed care plan with patient and nursing supervisor    Blanchie Serve, MD Internal Medicine [address], [place] [address] Cell Phone (Monday-Friday 8 am - 5 pm): [phone] On Call: [phone] and follow prompts after 5 pm and on weekends Office Phone: [phone] Office Fax: [phone]

## 2016-05-08 NOTE — Telephone Encounter (Signed)
Prescription request was received from:  [facility] [address] [place] [address]  Phone: [phone]  Fax: [phone]  

## 2016-05-10 ENCOUNTER — Other Ambulatory Visit: Payer: Self-pay

## 2016-05-10 LAB — CBC AND DIFFERENTIAL
HCT: 31 % — AB (ref 36–46)
Hemoglobin: 10 g/dL — AB (ref 12.0–16.0)
Neutrophils Absolute: 3 /uL
Platelets: 152 10*3/uL (ref 150–399)
WBC: 5.4 10*3/mL

## 2016-05-10 LAB — HEPATIC FUNCTION PANEL
ALT: 47 U/L — AB (ref 7–35)
AST: 27 U/L (ref 13–35)
Alkaline Phosphatase: 114 U/L (ref 25–125)
Bilirubin, Total: 0.5 mg/dL

## 2016-05-10 LAB — BASIC METABOLIC PANEL
BUN: 17 mg/dL (ref 4–21)
CREATININE: 1 mg/dL (ref 0.5–1.1)
GLUCOSE: 101 mg/dL
POTASSIUM: 3.9 mmol/L (ref 3.4–5.3)
Sodium: 137 mmol/L (ref 137–147)

## 2016-05-10 MED ORDER — OXYCODONE HCL 20 MG PO TABS: 20.0000 mg | ORAL_TABLET | Freq: Three times a day (TID) | ORAL | 0 refills | Status: AC | PRN

## 2016-05-10 NOTE — Telephone Encounter (Signed)
Rx faxed to [facility] @ [phone], phone number [phone]  

## 2016-05-11 ENCOUNTER — Non-Acute Institutional Stay (SKILLED_NURSING_FACILITY): Payer: Medicare Other | Admitting: Adult Health

## 2016-05-11 ENCOUNTER — Encounter: Payer: Self-pay | Admitting: Adult Health

## 2016-05-11 DIAGNOSIS — R600 Localized edema: Secondary | ICD-10-CM

## 2016-05-11 DIAGNOSIS — M545 Low back pain, unspecified: Secondary | ICD-10-CM

## 2016-05-11 DIAGNOSIS — E876 Hypokalemia: Secondary | ICD-10-CM | POA: Diagnosis not present

## 2016-05-11 DIAGNOSIS — E43 Unspecified severe protein-calorie malnutrition: Secondary | ICD-10-CM | POA: Diagnosis not present

## 2016-05-11 NOTE — Progress Notes (Signed)
Patient ID: [name], female   DOB: Apr 25, 1951, 64 y.o.   MRN: [id number]    DATE:  05/11/2016   MRN:  [id number]  BIRTHDAY: 10-Mar-1951  Facility:  Nursing Home Location:  [facility] [facility] [place] Room Number: 102-P  LEVEL OF CARE:  SNF [phone])  Contact Information    Name Relation Home Work [place] Sister [phone]       Code Status History    Date Active Date Inactive Code Status Order ID Comments User Context   05/03/2016  8:48 PM 05/07/2016  9:24 PM Full Code [id number]  [name], MD Inpatient   02/08/2015  5:24 PM 02/12/2015  2:09 PM Full Code [id number]  Leeroy Cha, MD Inpatient    Advance Directive Documentation   Flowsheet Row Most Recent Value  Type of Advance Directive  Out of facility DNR (pink MOST or yellow form)  Pre-existing out of facility DNR order (yellow form or pink MOST form)  No data  "MOST" Form in Place?  No data       Chief Complaint  Patient presents with  . Acute Visit    Low back pain, bilateral lower extremity edema    HISTORY OF PRESENT ILLNESS:  This is a 65 year old female who is being seen for BLE edema, 3+, weeping clear drainage. She is currently on Lasix 20 mg and edema has been worsening. She complains of lower back and requesting for Biofreeze gel.  She has been admitted to [facility] on 05/07/16 from [facility] post fall with rhabdomyolysis, acute renal failure, electrolyte imbalance and possible adrenal insufficiency. She was given IV fluids, oral Kayexalate, insulin with dextrose. She had suddenly stopped her prednisone 40 mg daily after taking it for 6 weeks and this was thought to have caused her sudden onset of weakness and tremors. She had tremors each were thought to be enhanced physiological tremor after neurologic consult. She had acute urinary retention that resolved at time of discharge. Her nephrotoxic drugs were held.   PAST MEDICAL HISTORY:   Past Medical History:  Diagnosis Date  . Anemia   . Cataracts, bilateral   . Chronic back pain    scoliosis/displacement of lumbosacral intervertebral disc/stenosis  . Depression    but doens't take any meds  . Diabetes mellitus without complication ([place])    takes Metformin daily  . Family history of adverse reaction to anesthesia    pts mom would get short of breath after anesthesia but was a smoker  . Fibromyalgia    takes Gabapentin daily  . Fusion of lumbar spine   . GERD (gastroesophageal reflux disease)    takes Nexium daily  . Heart murmur   . Hemorrhoids   . History of blood transfusion 2013   no abnormal reaction noted  . History of bronchitis 2009  . History of colon polyps    benign  . History of hiatal hernia   . History of kidney stones    has a kidney stone now  . History of migraine    hasn't had one in over a yr  . History of surgery on arm    plates and screws  . Hypertension   . Hypothyroidism    takes Synthroid daily  . Idiopathic thrombocytopenia ([place])   . Insomnia    takes Restoril nightly as needed   . Memory loss   . Muscle spasm    takes Flexeril daily as needed  .  Numbness and tingling    both toes  . PONV (postoperative nausea and vomiting)   . Shortness of breath dyspnea    with exertion but states its bc she can't exercise  . Urinary frequency   . Vitamin D deficiency      CURRENT MEDICATIONS: Reviewed  Patient's Medications  New Prescriptions   No medications on file  Previous Medications   AMINO ACIDS-PROTEIN HYDROLYS (FEEDING SUPPLEMENT, PRO-STAT SUGAR FREE 64,) LIQD    Take 30 mLs by mouth 2 (two) times daily.   CITALOPRAM HYDROBROMIDE (CELEXA PO)    Take 10-20 mg by mouth daily. Take 10 mg qd for 1 week, then increase to 20 mg po qd   CYCLOBENZAPRINE (FLEXERIL) 10 MG TABLET    Take 10 mg by mouth 3 (three) times daily as needed for muscle spasms.   ESTRADIOL (ESTRACE) 1 MG TABLET    Take 1 mg by mouth daily.   FUROSEMIDE  (LASIX) 40 MG TABLET    Take 40 mg by mouth daily.   GABAPENTIN (NEURONTIN) 300 MG CAPSULE    Take 1 capsule (300 mg total) by mouth at bedtime.   LEVOTHYROXINE (SYNTHROID, LEVOTHROID) 25 MCG TABLET    Take 25 mcg by mouth daily before breakfast.   MENTHOL, TOPICAL ANALGESIC, (BIOFREEZE) 4 % GEL    Apply 1 application topically 2 (two) times daily as needed (Pain). Apply to lower back   OMEPRAZOLE (PRILOSEC) 20 MG CAPSULE    Take 1 capsule (20 mg total) by mouth 2 (two) times daily before a meal.   OXYCODONE HCL 20 MG TABS    Take 1 tablet (20 mg total) by mouth 3 (three) times daily as needed (for pain).   POTASSIUM CHLORIDE SA (K-DUR,KLOR-CON) 20 MEQ TABLET    Take 20 mEq by mouth daily.   PROMETHAZINE (PHENERGAN) 25 MG TABLET    Take 25 mg by mouth every 6 (six) hours as needed for nausea or vomiting.   TEMAZEPAM (RESTORIL) 30 MG CAPSULE    Take 1 capsule (30 mg total) by mouth at bedtime as needed for sleep.   TRIAMCINOLONE (NASACORT ALLERGY 24HR) 55 MCG/ACT AERO NASAL INHALER    Place 2 sprays into the nose daily as needed (for congestion).   VITAMIN D, ERGOCALCIFEROL, (DRISDOL) 50000 UNITS CAPS CAPSULE    Take 50,000 Units by mouth once a week.  Modified Medications   No medications on file  Discontinued Medications   OXCARBAZEPINE (TRILEPTAL) 150 MG TABLET    Take one tablet four times daily.     Allergies  Allergen Reactions  . Formaldehyde     Other reaction(s): Shortness Of Breath, coughing  . Altace [Ramipril] Cough  . Dilaudid [Hydromorphone] Itching    Drip OK, pills cause itching     REVIEW OF SYSTEMS:  GENERAL: no change in appetite, no fatigue, no weight changes, no fever, chills or weakness EYES: Denies change in vision, dry eyes, eye pain, itching or discharge EARS: Denies change in hearing, ringing in ears, or earache NOSE: Denies nasal congestion or epistaxis MOUTH and THROAT: Denies oral discomfort, gingival pain or bleeding, pain from teeth or hoarseness    RESPIRATORY: no cough, SOB, DOE, wheezing, hemoptysis CARDIAC: no chest pain, edema or palpitations GI: no abdominal pain, diarrhea, constipation, heart burn, nausea or vomiting GU: Denies dysuria, frequency, hematuria, incontinence, or discharge EXTREMITIES:  Complains of low back pain PSYCHIATRIC: Denies feeling of depression or anxiety. No report of hallucinations, insomnia, paranoia, or agitation  PHYSICAL EXAMINATION  GENERAL APPEARANCE: Well nourished. In no acute distress. Normal body habitus SKIN:  BLE edema 3+, weeping clear drainage HEAD: Normal in size and contour. No evidence of trauma EYES: Lids open and close normally. No blepharitis, entropion or ectropion. PERRL. Conjunctivae are clear and sclerae are white. Lenses are without opacity EARS: Pinnae are normal. Patient hears normal voice tunes of the examiner MOUTH and THROAT: Lips are without lesions. Oral mucosa is moist and without lesions. Tongue is normal in shape, size, and color and without lesions NECK: supple, trachea midline, no neck masses, no thyroid tenderness, no thyromegaly LYMPHATICS: no LAN in the neck, no supraclavicular LAN RESPIRATORY: breathing is even & unlabored, BS CTAB CARDIAC: RRR, no murmur,no extra heart sounds, BLE edema 3+ GI: abdomen soft, normal BS, no masses, no tenderness, no hepatomegaly, no splenomegaly EXTREMITIES:  Able to move X 4 extremities PSYCHIATRIC: Alert and oriented X 3. Affect and behavior are appropriate  LABS/RADIOLOGY: Labs reviewed: 05/10/16  WBC 5.4 hemoglobin 10.0 hematocrit 31.4 MCV 92.6 platelet 152 sodium 137 potassium 3.9 glucose 101 BUN 17 creatinine 1.02 calcium 7.8 total protein 4.0 albumin 2.17 globulin 1.8 total bilirubin 0.46 alkaline phosphatase 114 SGOT 27 SGPT 47 GFR 57.53 creatinine kinase, total 44 Basic Metabolic Panel:  Recent Labs  05/03/16 1948  05/05/16 0536 05/06/16 0537 05/07/16 0524  NA  --   < > 132* 136 136  K  --   < > 3.8 3.8 3.9  CL   --   < > 108 112* 110  CO2  --   < > 18* 18* 23  GLUCOSE  --   < > 95 105* 120*  BUN  --   < > 27* 25* 22*  CREATININE  --   < > 1.81* 1.29* 1.34*  CALCIUM  --   < > 7.2* 7.6* 7.9*  MG 1.7  --   --   --   --   PHOS 5.0*  --   --   --   --   < > = values in this interval not displayed. Liver Function Tests:  Recent Labs  05/05/16 0536 05/06/16 0537 05/07/16 0524  AST 49* 35 34  ALT 31 34 43  ALKPHOS 89 92 100  BILITOT 0.6 0.4 0.7  PROT 3.4* 3.4* 3.9*  ALBUMIN 1.7* 1.7* 1.8*    Recent Labs  05/03/16 1352  LIPASE 11    CBC:  Recent Labs  05/04/16 0658 05/06/16 0537 05/07/16 0524  WBC 7.4 4.5 3.5*  NEUTROABS 4.8  --   --   HGB 9.5* 10.0* 9.4*  HCT 29.0* 32.0* 29.8*  MCV 90.1 92.2 92.8  PLT 190 160 144*   Cardiac Enzymes:  Recent Labs  05/03/16 1352 05/06/16 0537  CKTOTAL 545* 385*   CBG:  Recent Labs  05/06/16 2043 05/07/16 0757 05/07/16 1202  GLUCAP 164* 108* 105*      Dg Chest 2 View  Result Date: 05/03/2016 CLINICAL DATA:  Fall 2 days ago with shortness of breath, initial encounter EXAM: CHEST  2 VIEW COMPARISON:  12/28/2010 FINDINGS: Cardiac shadow is within normal limits. The lungs are well aerated bilaterally. No focal infiltrate or sizable effusion is seen. Postsurgical changes in the cervical spine are noted. No acute bony abnormality is seen. IMPRESSION: No active cardiopulmonary disease. Electronically Signed   By: Inez Catalina M.D.   On: 05/03/2016 17:46  Dg Shoulder Right  Result Date: 05/03/2016 CLINICAL DATA:  Fall 2 days ago with persistent  right shoulder pain, initial encounter EXAM: RIGHT SHOULDER - 2+ VIEW COMPARISON:  None. FINDINGS: Acromioclavicular degenerative changes are noted. No fracture or dislocation is seen. The underlying bony thorax is within normal limits. Postsurgical changes are noted in the cervical spine. IMPRESSION: No acute abnormality noted. Electronically Signed   By: Inez Catalina M.D.   On: 05/03/2016  17:50  Ct Head Wo Contrast  Result Date: 05/03/2016 CLINICAL DATA:  Fall, weakness. EXAM: CT HEAD WITHOUT CONTRAST TECHNIQUE: Contiguous axial images were obtained from the base of the skull through the vertex without intravenous contrast. COMPARISON:  None. FINDINGS: Brain: Ventricles are within normal limits in size and configuration. All areas of the brain demonstrate normal gray-white matter attenuation. There is no mass, hemorrhage, edema or other evidence of acute parenchymal abnormality. No extra-axial hemorrhage. Vascular: No hyperdense vessel or unexpected calcification. There are chronic calcified atherosclerotic changes of the large vessels at the skull base. Skull: Negative for fracture or focal lesion. Sinuses/Orbits: No acute findings. Other: None. IMPRESSION: Negative head CT.  No intracranial mass, hemorrhage or edema. Electronically Signed   By: Franki Cabot M.D.   On: 05/03/2016 18:01  US Renal  Result Date: 05/05/2016 CLINICAL DATA:  Acute renal failure.  Diabetic patient. EXAM: RENAL / URINARY TRACT ULTRASOUND COMPLETE COMPARISON:  None. FINDINGS: Right Kidney: Length: 9.1 cm. Normal parenchymal echogenicity. Mild renal cortical thinning. No mass, stone or hydronephrosis. Left Kidney: Length: 10.8 cm. Echogenicity within normal limits. No mass or hydronephrosis visualized. Bladder: Appears normal for degree of bladder distention. IMPRESSION: 1. No acute finding.  No hydronephrosis. 2. Mild right renal cortical thinning.  No other abnormality. Electronically Signed   By: Lajean Manes M.D.   On: 05/05/2016 22:47  Dg Foot Complete Right  Result Date: 05/03/2016 CLINICAL DATA:  Recent fall, subsequent encounter EXAM: RIGHT FOOT COMPLETE - 3+ VIEW COMPARISON:  05/01/2016 FINDINGS: No acute fracture or dislocation is noted. No soft tissue changes are seen. Small calcaneal spur is noted. IMPRESSION: No acute abnormality noted. No significant interval change from the study of 2 days  previous. Electronically Signed   By: Inez Catalina M.D.   On: 05/03/2016 17:48  Dg Hip Unilat W Or Wo Pelvis 2-3 Views Right  Result Date: 05/03/2016 CLINICAL DATA:  Fall 2 days ago with right hip pain, initial encounter EXAM: DG HIP (WITH OR WITHOUT PELVIS) 2-3V RIGHT COMPARISON:  None. FINDINGS: Pelvic ring is intact. Postsurgical changes are noted in the lower lumbar spine. No acute fracture or dislocation is noted. Mild degenerative changes of the hip joints are seen bilaterally. Diffuse vascular calcifications are seen. IMPRESSION: No acute abnormality noted. Electronically Signed   By: Inez Catalina M.D.   On: 05/03/2016 17:47   ASSESSMENT/PLAN:  Bilateral lower extremity edema - discontinue Lasix 20 mg and start Lasix 40 mg 1 tab by mouth daily  Hypokalemia - increase KCl to 20 meq PO Q D; BMP on 05/14/16 Lab Results  Component Value Date   K 3.9 05/07/2016   Protein calorie malnutrition, severe - albumin 2.17; has been referred to RD; start Procel 2 scoops BID  Low back pain - start Biofreeze 4% gel topically to lower back BID PRN     Durenda Age, NP [place]

## 2016-05-14 LAB — BASIC METABOLIC PANEL
BUN: 14 mg/dL (ref 4–21)
CREATININE: 1.1 mg/dL (ref 0.5–1.1)
GLUCOSE: 102 mg/dL
POTASSIUM: 4.3 mmol/L (ref 3.4–5.3)
Sodium: 137 mmol/L (ref 137–147)

## 2016-05-14 LAB — HEMOGLOBIN A1C: Hemoglobin A1C: 5.5

## 2016-05-15 ENCOUNTER — Non-Acute Institutional Stay (SKILLED_NURSING_FACILITY): Payer: Medicare Other | Admitting: Adult Health

## 2016-05-15 ENCOUNTER — Encounter: Payer: Self-pay | Admitting: Adult Health

## 2016-05-15 DIAGNOSIS — R600 Localized edema: Secondary | ICD-10-CM

## 2016-05-15 DIAGNOSIS — L03119 Cellulitis of unspecified part of limb: Secondary | ICD-10-CM | POA: Diagnosis not present

## 2016-05-15 NOTE — Progress Notes (Signed)
Patient ID: [name], female   DOB: 11/27/1950, 65 y.o.   MRN: [id number]    DATE:  05/15/2016   MRN:  [id number]  BIRTHDAY: August 01, 1951  Facility:  [facility]ursing Home Location:  [place] Room Number: 301-B  LEVEL OF CARE:  [phone])  Contact Information    Name Relation Home Work [place] Sister [phone]   Candise Che [id number]  [phone]       Code Status History    Date Active Date Inactive Code Status Order ID Comments User Context   05/03/2016  8:48 PM 05/07/2016  9:24 PM Full Code [id number]  [name], MD Inpatient   02/08/2015  5:24 PM 02/12/2015  2:09 PM Full Code [id number]  Leeroy Cha, MD Inpatient    Advance Directive Documentation   Flowsheet Row Most Recent Value  Type of Advance Directive  Out of facility DNR (pink MOST or yellow form)  Pre-existing out of facility DNR order (yellow form or pink MOST form)  No data  "MOST" Form in Place?  No data       Chief Complaint  Patient presents with  . Acute Visit    Cellulitis bilateral lower extremities    HISTORY OF PRESENT ILLNESS:  This is a 65 year old female who was recently started on Lasix 40 mg daily due to weeping BLE edema,3+. It was noted today that her right back of shin and left medial lateral shin has erythematous rashes, has small amount of clear drainage and has 2+ BLE edema still.  She has been admitted to [facility] on 05/07/16 from [facility] post fall with rhabdomyolysis, acute renal failure, electrolyte imbalance and possible adrenal insufficiency. She was given IV fluids, oral Kayexalate, insulin with dextrose. She had suddenly stopped her prednisone 40 mg daily after taking it for 6 weeks and this was thought to have caused her sudden onset of weakness and tremors. She had tremors each were thought to be enhanced physiological tremor after neurologic consult. She had acute urinary retention that  resolved at time of discharge. Her nephrotoxic drugs were held.   PAST MEDICAL HISTORY:  Past Medical History:  Diagnosis Date  . Anemia   . Cataracts, bilateral   . Chronic back pain    scoliosis/displacement of lumbosacral intervertebral disc/stenosis  . Depression    but doens't take any meds  . Diabetes mellitus without complication ([place])    takes Metformin daily  . Family history of adverse reaction to anesthesia    pts mom would get short of breath after anesthesia but was a smoker  . Fibromyalgia    takes Gabapentin daily  . Fusion of lumbar spine   . GERD (gastroesophageal reflux disease)    takes Nexium daily  . Heart murmur   . Hemorrhoids   . History of blood transfusion 2013   no abnormal reaction noted  . History of bronchitis 2009  . History of colon polyps    benign  . History of hiatal hernia   . History of kidney stones    has a kidney stone now  . History of migraine    hasn't had one in over a yr  . History of surgery on arm    plates and screws  . Hypertension   . Hypothyroidism    takes Synthroid daily  . Idiopathic thrombocytopenia ([place])   . Insomnia    takes Restoril nightly as needed   .  Memory loss   . Muscle spasm    takes Flexeril daily as needed  . Numbness and tingling    both toes  . PONV (postoperative nausea and vomiting)   . Shortness of breath dyspnea    with exertion but states its bc she can't exercise  . Urinary frequency   . Vitamin D deficiency      CURRENT MEDICATIONS: Reviewed  Patient's Medications  New Prescriptions   No medications on file  Previous Medications   AMINO ACIDS-PROTEIN HYDROLYS (FEEDING SUPPLEMENT, PRO-STAT SUGAR FREE 64,) LIQD    Take 30 mLs by mouth 2 (two) times daily.   CITALOPRAM HYDROBROMIDE (CELEXA PO)    Take 10-20 mg by mouth daily. Take 10 mg qd for 1 week, then increase to 20 mg po qd   CYCLOBENZAPRINE (FLEXERIL) 10 MG TABLET    Take 10 mg by mouth 3 (three) times daily as needed for  muscle spasms.   DOXYCYCLINE (VIBRA-TABS) 100 MG TABLET    Take 100 mg by mouth 2 (two) times daily.   ESTRADIOL (ESTRACE) 1 MG TABLET    Take 1 mg by mouth daily.    FUROSEMIDE (LASIX) 40 MG TABLET    Take 40 mg by mouth 2 (two) times daily. 40 mg BID x3 days and then 40 mg qd   GABAPENTIN (NEURONTIN) 300 MG CAPSULE    Take 1 capsule (300 mg total) by mouth at bedtime.   LEVOTHYROXINE (SYNTHROID, LEVOTHROID) 25 MCG TABLET    Take 25 mcg by mouth daily before breakfast.   MENTHOL, TOPICAL ANALGESIC, (BIOFREEZE) 4 % GEL    Apply 1 application topically 2 (two) times daily as needed (Pain). Apply to lower back   OMEPRAZOLE (PRILOSEC) 20 MG CAPSULE    Take 1 capsule (20 mg total) by mouth 2 (two) times daily before a meal.   OXYCODONE HCL 20 MG TABS    Take 1 tablet (20 mg total) by mouth 3 (three) times daily as needed (for pain).   POTASSIUM CHLORIDE SA (K-DUR,KLOR-CON) 20 MEQ TABLET    Take 20 mEq by mouth daily.   PROMETHAZINE (PHENERGAN) 25 MG TABLET    Take 25 mg by mouth every 6 (six) hours as needed for nausea or vomiting.   PROTEIN (PROCEL) POWD    Take 2 scoop by mouth 2 (two) times daily.   SACCHAROMYCES BOULARDII (FLORASTOR) 250 MG CAPSULE    Take 250 mg by mouth 2 (two) times daily.   TEMAZEPAM (RESTORIL) 30 MG CAPSULE    Take 1 capsule (30 mg total) by mouth at bedtime as needed for sleep.   TRIAMCINOLONE (NASACORT ALLERGY 24HR) 55 MCG/ACT AERO NASAL INHALER    Place 2 sprays into the nose daily as needed (for congestion).   VITAMIN D, ERGOCALCIFEROL, (DRISDOL) 50000 UNITS CAPS CAPSULE    Take 50,000 Units by mouth once a week.  Modified Medications   No medications on file  Discontinued Medications   No medications on file     Allergies  Allergen Reactions  . Formaldehyde     Other reaction(s): Shortness Of Breath, coughing  . Altace [Ramipril] Cough  . Dilaudid [Hydromorphone] Itching    Drip OK, pills cause itching     REVIEW OF SYSTEMS:  GENERAL: no change in  appetite, no fatigue, no weight changes, no fever, chills or weakness EYES: Denies change in vision, dry eyes, eye pain, itching or discharge EARS: Denies change in hearing, ringing in ears, or earache NOSE: Denies nasal  congestion or epistaxis MOUTH and THROAT: Denies oral discomfort, gingival pain or bleeding, pain from teeth or hoarseness   RESPIRATORY: no cough, SOB, DOE, wheezing, hemoptysis CARDIAC: no chest pain, or palpitations GI: no abdominal pain, diarrhea, constipation, heart burn, nausea or vomiting GU: Denies dysuria, frequency, hematuria, incontinence, or discharge EXTREMITIES:  Complains of low back pain PSYCHIATRIC: Denies feeling of depression or anxiety. No report of hallucinations, insomnia, paranoia, or agitation   PHYSICAL EXAMINATION  GENERAL APPEARANCE: Well nourished. In no acute distress. Normal body habitus SKIN:  BLE edema 3+, weeping clear drainage HEAD: Normal in size and contour. No evidence of trauma EYES: Lids open and close normally. No blepharitis, entropion or ectropion. PERRL. Conjunctivae are clear and sclerae are white. Lenses are without opacity EARS: Pinnae are normal. Patient hears normal voice tunes of the examiner MOUTH and THROAT: Lips are without lesions. Oral mucosa is moist and without lesions. Tongue is normal in shape, size, and color and without lesions NECK: supple, trachea midline, no neck masses, no thyroid tenderness, no thyromegaly LYMPHATICS: no LAN in the neck, no supraclavicular LAN RESPIRATORY: breathing is even & unlabored, BS CTAB CARDIAC: RRR, no murmur,no extra heart sounds, BLE edema 3+ GI: abdomen soft, normal BS, no masses, no tenderness, no hepatomegaly, no splenomegaly EXTREMITIES:  Able to move X 4 extremities PSYCHIATRIC: Alert and oriented X 3. Affect and behavior are appropriate  LABS/RADIOLOGY: Labs reviewed: 05/10/16  WBC 5.4 hemoglobin 10.0 hematocrit 31.4 MCV 92.6 platelet 152 sodium 137 potassium 3.9 glucose  101 BUN 17 creatinine 1.02 calcium 7.8 total protein 4.0 albumin 2.17 globulin 1.8 total bilirubin 0.46 alkaline phosphatase 114 SGOT 27 SGPT 47 GFR 57.53 creatinine kinase, total 44 Basic Metabolic Panel:  Recent Labs  05/03/16 1948  05/05/16 0536 05/06/16 0537 05/07/16 0524 05/10/16 1349 05/14/16 1225  NA  --   < > 132* 136 136 137 137  K  --   < > 3.8 3.8 3.9 3.9 4.3  CL  --   < > 108 112* 110  --   --   CO2  --   < > 18* 18* 23  --   --   GLUCOSE  --   < > 95 105* 120*  --   --   BUN  --   < > 27* 25* 22* 17 14  CREATININE  --   < > 1.81* 1.29* 1.34* 1.0 1.1  CALCIUM  --   < > 7.2* 7.6* 7.9*  --   --   MG 1.7  --   --   --   --   --   --   PHOS 5.0*  --   --   --   --   --   --   < > = values in this interval not displayed. Liver Function Tests:  Recent Labs  05/05/16 0536 05/06/16 0537 05/07/16 0524 05/10/16 1349  AST 49* 35 34 27  ALT 31 34 43 47*  ALKPHOS 89 92 100 114  BILITOT 0.6 0.4 0.7  --   PROT 3.4* 3.4* 3.9*  --   ALBUMIN 1.7* 1.7* 1.8*  --     Recent Labs  05/03/16 1352  LIPASE 11    CBC:  Recent Labs  05/04/16 0658 05/06/16 0537 05/07/16 0524 05/10/16 1349  WBC 7.4 4.5 3.5* 5.4  NEUTROABS 4.8  --   --  3  HGB 9.5* 10.0* 9.4* 10.0*  HCT 29.0* 32.0* 29.8* 31*  MCV 90.1 92.2 92.8  --  PLT 190 160 144* 152   Cardiac Enzymes:  Recent Labs  05/03/16 1352 05/06/16 0537  CKTOTAL 545* 385*   CBG:  Recent Labs  05/06/16 2043 05/07/16 0757 05/07/16 1202  GLUCAP 164* 108* 105*      Dg Chest 2 View  Result Date: 05/03/2016 CLINICAL DATA:  Fall 2 days ago with shortness of breath, initial encounter EXAM: CHEST  2 VIEW COMPARISON:  12/28/2010 FINDINGS: Cardiac shadow is within normal limits. The lungs are well aerated bilaterally. No focal infiltrate or sizable effusion is seen. Postsurgical changes in the cervical spine are noted. No acute bony abnormality is seen. IMPRESSION: No active cardiopulmonary disease. Electronically Signed    By: Inez Catalina M.D.   On: 05/03/2016 17:46  Dg Shoulder Right  Result Date: 05/03/2016 CLINICAL DATA:  Fall 2 days ago with persistent right shoulder pain, initial encounter EXAM: RIGHT SHOULDER - 2+ VIEW COMPARISON:  None. FINDINGS: Acromioclavicular degenerative changes are noted. No fracture or dislocation is seen. The underlying bony thorax is within normal limits. Postsurgical changes are noted in the cervical spine. IMPRESSION: No acute abnormality noted. Electronically Signed   By: Inez Catalina M.D.   On: 05/03/2016 17:50  Ct Head Wo Contrast  Result Date: 05/03/2016 CLINICAL DATA:  Fall, weakness. EXAM: CT HEAD WITHOUT CONTRAST TECHNIQUE: Contiguous axial images were obtained from the base of the skull through the vertex without intravenous contrast. COMPARISON:  None. FINDINGS: Brain: Ventricles are within normal limits in size and configuration. All areas of the brain demonstrate normal gray-white matter attenuation. There is no mass, hemorrhage, edema or other evidence of acute parenchymal abnormality. No extra-axial hemorrhage. Vascular: No hyperdense vessel or unexpected calcification. There are chronic calcified atherosclerotic changes of the large vessels at the skull base. Skull: Negative for fracture or focal lesion. Sinuses/Orbits: No acute findings. Other: None. IMPRESSION: Negative head CT.  No intracranial mass, hemorrhage or edema. Electronically Signed   By: Franki Cabot M.D.   On: 05/03/2016 18:01  US Renal  Result Date: 05/05/2016 CLINICAL DATA:  Acute renal failure.  Diabetic patient. EXAM: RENAL / URINARY TRACT ULTRASOUND COMPLETE COMPARISON:  None. FINDINGS: Right Kidney: Length: 9.1 cm. Normal parenchymal echogenicity. Mild renal cortical thinning. No mass, stone or hydronephrosis. Left Kidney: Length: 10.8 cm. Echogenicity within normal limits. No mass or hydronephrosis visualized. Bladder: Appears normal for degree of bladder distention. IMPRESSION: 1. No acute finding.   No hydronephrosis. 2. Mild right renal cortical thinning.  No other abnormality. Electronically Signed   By: Lajean Manes M.D.   On: 05/05/2016 22:47  Dg Foot Complete Right  Result Date: 05/03/2016 CLINICAL DATA:  Recent fall, subsequent encounter EXAM: RIGHT FOOT COMPLETE - 3+ VIEW COMPARISON:  05/01/2016 FINDINGS: No acute fracture or dislocation is noted. No soft tissue changes are seen. Small calcaneal spur is noted. IMPRESSION: No acute abnormality noted. No significant interval change from the study of 2 days previous. Electronically Signed   By: Inez Catalina M.D.   On: 05/03/2016 17:48  Dg Hip Unilat W Or Wo Pelvis 2-3 Views Right  Result Date: 05/03/2016 CLINICAL DATA:  Fall 2 days ago with right hip pain, initial encounter EXAM: DG HIP (WITH OR WITHOUT PELVIS) 2-3V RIGHT COMPARISON:  None. FINDINGS: Pelvic ring is intact. Postsurgical changes are noted in the lower lumbar spine. No acute fracture or dislocation is noted. Mild degenerative changes of the hip joints are seen bilaterally. Diffuse vascular calcifications are seen. IMPRESSION: No acute abnormality noted. Electronically Signed  By: Inez Catalina M.D.   On: 05/03/2016 17:47   ASSESSMENT/PLAN:  Bilateral lower extremity edema - discontinue Lasix 40 mg daily and start Lasix 40 mg BID X 3 days then Lasix 40 mg daily; BMP in 1 week; unna boots to BLE extremity and change Q M-W-F  Bilateral lower extremity cellulitis - start Doxycycline 100 mg PO BID X 10 days and Florastor 250 mg 1 capsule PO BID X 13 days      Durenda Age, NP [employer] [phone]

## 2016-05-22 LAB — BASIC METABOLIC PANEL
BUN: 17 mg/dL (ref 4–21)
Creatinine: 1.8 mg/dL — AB (ref 0.5–1.1)
Glucose: 121 mg/dL
Potassium: 4.7 mmol/L (ref 3.4–5.3)
Sodium: 132 mmol/L — AB (ref 137–147)

## 2016-05-23 ENCOUNTER — Non-Acute Institutional Stay (SKILLED_NURSING_FACILITY): Payer: Medicare Other | Admitting: Adult Health

## 2016-05-23 ENCOUNTER — Encounter: Payer: Self-pay | Admitting: Adult Health

## 2016-05-23 DIAGNOSIS — L299 Pruritus, unspecified: Secondary | ICD-10-CM

## 2016-05-23 DIAGNOSIS — R600 Localized edema: Secondary | ICD-10-CM

## 2016-05-23 DIAGNOSIS — E1169 Type 2 diabetes mellitus with other specified complication: Secondary | ICD-10-CM

## 2016-05-23 DIAGNOSIS — N179 Acute kidney failure, unspecified: Secondary | ICD-10-CM

## 2016-05-23 DIAGNOSIS — M797 Fibromyalgia: Secondary | ICD-10-CM | POA: Diagnosis not present

## 2016-05-23 DIAGNOSIS — E43 Unspecified severe protein-calorie malnutrition: Secondary | ICD-10-CM

## 2016-05-23 DIAGNOSIS — E039 Hypothyroidism, unspecified: Secondary | ICD-10-CM | POA: Diagnosis not present

## 2016-05-23 DIAGNOSIS — L03119 Cellulitis of unspecified part of limb: Secondary | ICD-10-CM

## 2016-05-23 DIAGNOSIS — T796XXS Traumatic ischemia of muscle, sequela: Secondary | ICD-10-CM | POA: Diagnosis not present

## 2016-05-23 DIAGNOSIS — I10 Essential (primary) hypertension: Secondary | ICD-10-CM | POA: Diagnosis not present

## 2016-05-23 DIAGNOSIS — D649 Anemia, unspecified: Secondary | ICD-10-CM

## 2016-05-23 DIAGNOSIS — R531 Weakness: Secondary | ICD-10-CM | POA: Diagnosis not present

## 2016-05-23 DIAGNOSIS — M4316 Spondylolisthesis, lumbar region: Secondary | ICD-10-CM

## 2016-05-23 DIAGNOSIS — R251 Tremor, unspecified: Secondary | ICD-10-CM

## 2016-05-23 DIAGNOSIS — E119 Type 2 diabetes mellitus without complications: Secondary | ICD-10-CM | POA: Diagnosis not present

## 2016-05-23 DIAGNOSIS — K219 Gastro-esophageal reflux disease without esophagitis: Secondary | ICD-10-CM | POA: Diagnosis not present

## 2016-05-23 DIAGNOSIS — F32A Depression, unspecified: Secondary | ICD-10-CM

## 2016-05-23 DIAGNOSIS — E669 Obesity, unspecified: Secondary | ICD-10-CM

## 2016-05-23 DIAGNOSIS — F329 Major depressive disorder, single episode, unspecified: Secondary | ICD-10-CM

## 2016-05-23 NOTE — Progress Notes (Signed)
Patient ID: [name], female   DOB: [birth year], 65 y.o.   MRN: [id number]    DATE:    05/23/16  MRN:  [id number]  BIRTHDAY: 1951/01/07  Facility:  [facility]urs[facility] Home Location:  [place] Room Number: 102-P  LEVEL OF CARE:  SNF [phone])  Contact Information    Name Relation Home Work [place] Sister [phone]   Candise Che [id number]  [phone]       Code Status History    Date Active Date Inactive Code Status Order ID Comments User Context   05/03/2016  8:48 PM 05/07/2016  9:24 PM Full Code [id number]  [name], MD Inpatient   02/08/2015  5:24 PM 02/12/2015  2:09 PM Full Code [id number]  Leeroy Cha, MD Inpatient    Advance Directive Documentation   Flowsheet Row Most Recent Value  Type of Advance Directive  Out of facility DNR (pink MOST or yellow form)  Pre-existing out of facility DNR order (yellow form or pink MOST form)  No data  "MOST" Form in Place?  No data       Chief Complaint  Patient presents with  . Discharge Note    HISTORY OF PRESENT ILLNESS:  This is a 65 year old female who is for discharge home with Home health PT, OT, CNA and Nursing. DME:  Bedside commode and Rolling walker.  She was recently started on Lasix 40 mg daily due to weeping BLE edema,3+. It was noted today that her right back of shin and left medial lateral shin has erythShe was recently  started on antibiotic for BLE cellulitis. It was noted that her latest creatinine is 1.76, elevated.  She has been admitted to [facility] on 05/07/16 from [facility] post fall with rhabdomyolysis, acute renal failure, electrolyte imbalance and possible adrenal insufficiency. She was given IV fluids, oral Kayexalate, insulin with dextrose. She had suddenly stopped her prednisone 40 mg daily after taking it for 6 weeks and this was thought to have caused her sudden onset of weakness and tremors. 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 She had acute urinary retention that resolved at time of discharge. Her nephrotoxic drugs were held.  Patient was admitted to this facility for short-term rehabilitation after the patient's recent hospitalization.  Patient has completed SNF rehabilitation and therapy has cleared the patient for discharge.   PAST MEDICAL HISTORY:  Past Medical History:  Diagnosis Date  . Anemia   . Cataracts, bilateral   . Chronic back pain    scoliosis/displacement of lumbosacral intervertebral disc/stenosis  . Depression    but doens't take any meds  . Diabetes mellitus without complication ([place])    takes Metformin daily  . Family history of adverse reaction to anesthesia    pts mom would get short of breath after anesthesia but was a smoker  . Fibromyalgia    takes Gabapentin daily  . Fusion of lumbar spine   . GERD (gastroesophageal reflux disease)    takes Nexium daily  . Heart murmur   . Hemorrhoids   . History of blood transfusion 2013   no abnormal reaction noted  . History of bronchitis 2009  . History of colon polyps    benign  . History of hiatal hernia   . History of kidney stones    has a kidney stone now  . History of migraine    hasn't had one  in over a yr  . History of surgery on arm    plates and screws  . Hypertension   . Hypothyroidism    takes Synthroid daily  . Idiopathic thrombocytopenia ([place])   . Insomnia    takes Restoril nightly as needed   . Memory loss   . Muscle spasm    takes Flexeril daily as needed  . Numbness and tingling    both toes  . PONV (postoperative nausea and vomiting)   . Shortness of breath dyspnea    with exertion but states its bc she can't exercise  . Urinary frequency   . Vitamin D deficiency      CURRENT MEDICATIONS: Reviewed  Patient's Medications  New Prescriptions   No medications on file  Previous Medications   AMINO ACIDS-PROTEIN HYDROLYS  (FEEDING SUPPLEMENT, PRO-STAT SUGAR FREE 64,) LIQD    Take 30 mLs by mouth 2 (two) times daily.   CITALOPRAM HYDROBROMIDE (CELEXA PO)    Take 20 mg by mouth daily.    CYCLOBENZAPRINE (FLEXERIL) 10 MG TABLET    Take 10 mg by mouth 3 (three) times daily as needed for muscle spasms.   DIPHENHYDRAMINE (BENADRYL) 25 MG CAPSULE    Take 25 mg by mouth once.   ESTRADIOL (ESTRACE) 1 MG TABLET    Take 1 mg by mouth daily.    FUROSEMIDE (LASIX) 20 MG TABLET    Take 20 mg by mouth daily. Take 1 tab qd x7 days   GABAPENTIN (NEURONTIN) 300 MG CAPSULE    Take 1 capsule (300 mg total) by mouth at bedtime.   LEVOTHYROXINE (SYNTHROID, LEVOTHROID) 25 MCG TABLET    Take 25 mcg by mouth daily before breakfast.   MENTHOL, TOPICAL ANALGESIC, (BIOFREEZE) 4 % GEL    Apply 1 application topically 2 (two) times daily as needed (Pain). Apply to lower back   OMEPRAZOLE (PRILOSEC) 20 MG CAPSULE    Take 1 capsule (20 mg total) by mouth 2 (two) times daily before a meal.   OXYCODONE HCL 20 MG TABS    Take 1 tablet (20 mg total) by mouth 3 (three) times daily as needed (for pain).   POTASSIUM CHLORIDE (K-DUR,KLOR-CON) 10 MEQ TABLET    Take 10 mEq by mouth 2 (two) times daily. Take M-W-F x1 week   PROMETHAZINE (PHENERGAN) 25 MG TABLET    Take 25 mg by mouth every 6 (six) hours as needed for nausea or vomiting.   PROTEIN (PROCEL) POWD    Take 2 scoop by mouth 2 (two) times daily.   SACCHAROMYCES BOULARDII (FLORASTOR) 250 MG CAPSULE    Take 250 mg by mouth 2 (two) times daily.   TEMAZEPAM (RESTORIL) 30 MG CAPSULE    Take 1 capsule (30 mg total) by mouth at bedtime as needed for sleep.   TRIAMCINOLONE (NASACORT ALLERGY 24HR) 55 MCG/ACT AERO NASAL INHALER    Place 2 sprays into the nose daily as needed (for congestion).   VITAMIN D, ERGOCALCIFEROL, (DRISDOL) 50000 UNITS CAPS CAPSULE    Take 50,000 Units by mouth once a week.  Modified Medications   No medications on file  Discontinued Medications   DOXYCYCLINE (VIBRA-TABS) 100 MG  TABLET    Take 100 mg by mouth 2 (two) times daily.   FUROSEMIDE (LASIX) 40 MG TABLET    Take 40 mg by mouth 2 (two) times daily. 40 mg BID x3 days and then 40 mg qd   POTASSIUM CHLORIDE SA (K-DUR,KLOR-CON) 20 MEQ TABLET  Take 20 mEq by mouth daily.     Allergies  Allergen Reactions  . Formaldehyde     Other reaction(s): Shortness Of Breath, coughing  . Altace [Ramipril] Cough  . Dilaudid [Hydromorphone] Itching    Drip OK, pills cause itching     REVIEW OF SYSTEMS:  GENERAL: no change in appetite, no fatigue, no weight changes, no fever, chills or weakness EYES: Denies change in vision, dry eyes, eye pain, itching or discharge EARS: Denies change in hearing, ringing in ears, or earache NOSE: Denies nasal congestion or epistaxis MOUTH and THROAT: Denies oral discomfort, gingival pain or bleeding, pain from teeth or hoarseness   RESPIRATORY: no cough, SOB, DOE, wheezing, hemoptysis CARDIAC: no chest pain, or palpitations GI: no abdominal pain, diarrhea, constipation, heart burn, nausea or vomiting GU: Denies dysuria, frequency, hematuria, incontinence, or discharge EXTREMITIES:  Complains of low back pain PSYCHIATRIC: Denies feeling of depression or anxiety. No report of hallucinations, insomnia, paranoia, or agitation   PHYSICAL EXAMINATION  GENERAL APPEARANCE: Well nourished. In no acute distress. Obese SKIN:  BLE covered with unna boots HEAD: Normal in size and contour. No evidence of trauma EYES: Lids open and close normally. No blepharitis, entropion or ectropion. PERRL. Conjunctivae are clear and sclerae are white. Lenses are without opacity EARS: Pinnae are normal. Patient hears normal voice tunes of the examiner MOUTH and THROAT: Lips are without lesions. Oral mucosa is moist and without lesions. Tongue is normal in shape, size, and color and without lesions NECK: supple, trachea midline, no neck masses, no thyroid tenderness, no thyromegaly LYMPHATICS: no LAN in the  neck, no supraclavicular LAN RESPIRATORY: breathing is even & unlabored, BS CTAB CARDIAC: RRR, no murmur,no extra heart sounds, BLE edema 2+ GI: abdomen soft, normal BS, no masses, no tenderness, no hepatomegaly, no splenomegaly EXTREMITIES:  Able to move X 4 extremities PSYCHIATRIC: Alert and oriented X 3. Affect and behavior are appropriate  LABS/RADIOLOGY: Labs reviewed: 05/10/16  WBC 5.4 hemoglobin 10.0 hematocrit 31.4 MCV 92.6 platelet 152 sodium 137 potassium 3.9 glucose 101 BUN 17 creatinine 1.02 calcium 7.8 total protein 4.0 albumin 2.17 globulin 1.8 total bilirubin 0.46 alkaline phosphatase 114 SGOT 27 SGPT 47 GFR 57.53 creatinine kinase, total 44 Basic Metabolic Panel:  Recent Labs  05/03/16 1948  05/05/16 0536 05/06/16 0537 05/07/16 0524 05/10/16 1349 05/14/16 1225 05/22/16 1311  NA  --   < > 132* 136 136 137 137 132*  K  --   < > 3.8 3.8 3.9 3.9 4.3 4.7  CL  --   < > 108 112* 110  --   --   --   CO2  --   < > 18* 18* 23  --   --   --   GLUCOSE  --   < > 95 105* 120*  --   --   --   BUN  --   < > 27* 25* 22* 17 14 17   CREATININE  --   < > 1.81* 1.29* 1.34* 1.0 1.1 1.8*  CALCIUM  --   < > 7.2* 7.6* 7.9*  --   --   --   MG 1.7  --   --   --   --   --   --   --   PHOS 5.0*  --   --   --   --   --   --   --   < > = values in this interval not displayed. Liver Function Tests:  Recent Labs  05/05/16 0536 05/06/16 0537 05/07/16 0524 05/10/16 1349  AST 49* 35 34 27  ALT 31 34 43 47*  ALKPHOS 89 92 100 114  BILITOT 0.6 0.4 0.7  --   PROT 3.4* 3.4* 3.9*  --   ALBUMIN 1.7* 1.7* 1.8*  --     Recent Labs  05/03/16 1352  LIPASE 11    CBC:  Recent Labs  05/04/16 0658 05/06/16 0537 05/07/16 0524 05/10/16 1349  WBC 7.4 4.5 3.5* 5.4  NEUTROABS 4.8  --   --  3  HGB 9.5* 10.0* 9.4* 10.0*  HCT 29.0* 32.0* 29.8* 31*  MCV 90.1 92.2 92.8  --   PLT 190 160 144* 152   Cardiac Enzymes:  Recent Labs  05/03/16 1352 05/06/16 0537  CKTOTAL 545* 385*    CBG:  Recent Labs  05/06/16 2043 05/07/16 0757 05/07/16 1202  GLUCAP 164* 108* 105*       ASSESSMENT/PLAN:  Generalized weakness - for Home health PT, OT, CNA and Nursing, for therapeutic and strengthening exercises.  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; follow-up with neurology  Acute kidney injury -  decrerase Lasix from 40 mg to 20 mg daily; BMP in 1 week Lab Results  Component Value Date   CREATININE 1.8 (A) 05/22/2016    Pruritus - give Benadryl 25 mg PO X 1  Rhabdomylosis - S/P IV fluids; fall precautions  Lumbar spondylosis - continue oxycodone 20 mg tid prn for pain and flexeril 10 mg tid prn,  for muscle spasm  Depression - mood is stable; continue Celexa 20 mg 1 tab PO daily  Anemia - stable Lab Results  Component Value Date   HGB 10.0 (A) 05/10/2016    Protein calorie malnutrition, severe - albumin 2.17; continue Pro-stat 30 ml BID   Fibromyalgia - continue Gabapentin 300 mg 1 capsule Q HS  Hypothyroidism - continue Synthroid 25 mcg 1 tab daily Lab Results  Component Value Date   TSH 2.344 05/03/2016    Diabetes mellitus, typ2 with neuropathy - well-controlled Lab Results  Component Value Date   HGBA1C 5.5 05/14/2016    HTN well-controlled without medications @ present  GERD - stable; continue prilosec 20 mg bid  Bilateral lower extremity edema - discontinue Lasix 40 mg daily and start Lasix 20 mg daily X 7 days; BMP in 1 week; unna boots to BLE extremity and change Q M-W-F  Bilateral lower extremity cellulitis - she was started on Doxycycline but complained of itching so it was changed to Keflex. However, she is requesting for keflex to be discontinues also since she continues to itch with keflex.     I have filled out patient's discharge paperwork and written prescriptions.  Patient will receive home health PT, OT, Nursing and CNA.  DME provided:  Bedside commode and Rolling  walker.   Total discharge time: Greater than 30 minutes Greater than 50% was spent in counseling and coordination of care with the patient.   Discharge time involved coordination of the discharge process with social worker, nursing staff and therapy department. Medical justification for home health services/DME verified.    Durenda Age, NP [employer] [phone]

## 2016-05-28 ENCOUNTER — Ambulatory Visit: Payer: Medicare Other | Admitting: Neurology

## 2016-05-28 ENCOUNTER — Telehealth: Payer: Self-pay | Admitting: Neurology

## 2016-05-28 NOTE — Telephone Encounter (Signed)
Pt called into cancel her appt today. She has been in the hospital and then was transferred to [facility] for the last month. She has 2 no shows and today will be her 3rd no show. She would like to make sure the physician know she would have called if she could have. She has just returned home and is afraid to drive by herself right now.  I will not r/s the appt until the nurse and physician say that it is ok to do. Please call the pt to discuss at her requests. [phone]

## 2016-05-28 NOTE — Telephone Encounter (Signed)
Ok, per Dr. Krista Blue, to reschedule.  Returned call to patient and made new appt.

## 2016-06-28 ENCOUNTER — Ambulatory Visit: Payer: Self-pay | Admitting: Neurology

## 2016-07-02 ENCOUNTER — Ambulatory Visit (INDEPENDENT_AMBULATORY_CARE_PROVIDER_SITE_OTHER): Payer: Medicare Other | Admitting: Neurology

## 2016-07-02 ENCOUNTER — Encounter: Payer: Self-pay | Admitting: Neurology

## 2016-07-02 VITALS — BP 119/70 | HR 95 | Ht 62.0 in | Wt 156.0 lb

## 2016-07-02 DIAGNOSIS — E039 Hypothyroidism, unspecified: Secondary | ICD-10-CM

## 2016-07-02 DIAGNOSIS — E43 Unspecified severe protein-calorie malnutrition: Secondary | ICD-10-CM | POA: Diagnosis not present

## 2016-07-02 DIAGNOSIS — M5000 Cervical disc disorder with myelopathy, unspecified cervical region: Secondary | ICD-10-CM

## 2016-07-02 NOTE — Progress Notes (Signed)
Chief Complaint  Patient presents with  . Back Pain    Reports having worsening balance that caused a fall in July.  She was admitted to the hospital for three days then discharged to [facility] for physical therapy. She is still having significant low back pain.  She has lost 30+ pounds over the last six weeks.  . Neck Pain    She has continued neck pain that radiates into both arms.      PATIENT: [name] DOB: [birth year]  Chief Complaint  Patient presents with  . Back Pain    Reports having worsening balance that caused a fall in July.  She was admitted to the hospital for three days then discharged to [facility] for physical therapy. She is still having significant low back pain.  She has lost 30+ pounds over the last six weeks.  . Neck Pain    She has continued neck pain that radiates into both arms.     HISTORICAL  [name] is a 65 years old right-handed female, seen in refer by her primary care physician Dr.Janice Theola Sequin  for evaluation of one episode of sudden onset left-sided weakness, difficulty texting, Initial evaluation was in November 21st 2016  She had a history of hypertension, diabetes since 2012, hyperlipidemia, past history of depression, kidney stone, lumbar decompression surgery in May 2016 by neurosurgeon Dr. Joya Salm, bilateral knee replacements, She had cervical decompression surgery in 2009, by Dr. Joya Salm, prior to surgery, she had neck pain, shooting pain to her spine,   In July 25 2015, while his she was texting using her phone,, she noticed she typed the wrong letters, also noticed mild left arm weakness, when she called her aunt, she was noticed to have slurred speech, her aunt was a Equities trader, who came by to check on the patient, noticed that she was mildly weak on the left arm and leg, mild slurred speech, initial weakness last about 30 minutes, she felt tired, took a nap,  Since the incident, she continue complains of fatigue,            She also complains of gradual onset mild memory trouble, missing her routine lunch appointment with her family, tends to take the note Laboratory evaluation showed normal B12, TSH,  UPDATE Nov 09 2015: Her left side numbness weakness is getting better. She still has right lumbar radiculopathy, radiating pain has improved post lumbar decompression surgery in May 2016, getting worse after bearing weight.  She has frequent bilateral legs, left hand muscle spasm, she does have neck pain, hx of cervical fusion in the past.  She has tried valium, flexeril, without helping her symptoms  MRI of the brain in December 2016 that was normal, MRI of lumbar in 2015 prior to lumbar decompression surgery in May 2016, multi-level degenerative disc disease, most severe at L4-5, L3 and 4, with moderate to severe bilateral foraminal stenosis, mild central canal stenosis  Echocardiogram was within normal limit, ultrasound of carotid artery showed no significant abnormality  Laboratory evaluation, normal CMP, glucose 106 mild elevated, creatinine was 1.0, normal CBC, with hemoglobin of 10 point 2, mild decrease, magnesium 1.8 T1 was within normal limits 7, folic acid 8.9 123456 more than 1700, A1c was 5.4, normal thyroid functional task, TSH, cholesterol 166 LDL 23 HDL was 133, normal iron panel  Update Feb 16 2016: EMG nerve conduction study March 2017: Showed evidence of mild chronic left lumbar radiculopathy mainly involving left L4-5 myotomes. There  is no evidence of right upper extremity neuropathy or right cervical radiculopathy.  She complains of chronic low back pain neck pain, radiating pain to bilateral upper extremity, had a history of cervical decompression surgery in 2009 by Dr. Joya Salm after heavy lifting, radiating pain to her spine sounds like cervical myelopathy  Trileptal 150 mg every night has helped her muscle cramping  UPDATE Sept 25th 2017: She fell twice on April 30 2016, fell at her closet  at evening time, she denied confusion, no loss of consciousness, but could not reach her phone asking for help until 3 days later on May 03 2016, eventually she was able to knock over a table get to her phone to call 911, she was admitted to the hospital, was found to have hyponatremia, hyperkalemia, acute renal failure, metabolic derangements, suspected that patient presented with a component of adrenal insufficiency with abrupt discontinuation of the steroid, she was on tapering dose of steroid by her dermatologist for skin rash at her back, with a diagnosis of lichen planus, she was discharged to [facility] for [facility] weeks, now she is back home, she lives alone.  She has lost 33 Lbs in 6 weeks, she now complains of worsening balance, she also complains of neck pain,[place] signs,  Increased bilateral lower extremity swelling, rashes,   She has worsening bilateral lower extremity paresthesia, worsening chronic constipation, worsening urinary urgency, occasionally incontinence, we have personally reviewed MRI cervical spine May 2017: Multilevel degenerative changes, evidence of previous fusion anterior fusion 123456, T1 with metallic hardware  1. At C3-4: disc bulging and uncovertebral joint hypertrophy with moderate spinal stenosis and severe biforaminal stenosis. 2. At At C4-5, C5-6, C7-T1: uncovertebral joint hypertrophy with severe biforaminal stenosis. 3. At [id number]: metallic artifact projecting posteriorly into the spinal canal; uncovertebral joint hypertrophy and facet hypertrophy with severe biforaminal stenosis.  Reviewed laboratory evaluations, A1c 5.5, CPK was elevated 545, normal CBC with hemoglobin of 11 point 5, upon admission on May 03 2016, potassium 6.0, glucose 146,sodium 129, chloride 18, GFR 17, normal 123456 [id number], folic acid [id number], ferritin 48, decreased iron 21  REVIEW OF SYSTEMS: Full 14 system review of systems performed and notable only for gait abnormality, back  pain, walking difficulty, neck pain,   ALLERGIES: Allergies  Allergen Reactions  . Formaldehyde     Other reaction(s): Shortness Of Breath, coughing  . Altace [Ramipril] Cough  . Dilaudid [Hydromorphone] Itching    Drip OK, pills cause itching    HOME MEDICATIONS: Current Outpatient Prescriptions  Medication Sig Dispense Refill  . cyclobenzaprine (FLEXERIL) 10 MG tablet Take 10 mg by mouth 3 (three) times daily as needed for muscle spasms.    . diphenhydrAMINE (BENADRYL) 25 mg capsule Take 25 mg by mouth once.    Marland Kitchen estradiol (ESTRACE) 1 MG tablet Take 1 mg by mouth daily.     Marland Kitchen gabapentin (NEURONTIN) 300 MG capsule Take 1 capsule (300 mg total) by mouth at bedtime. 30 capsule 0  . levothyroxine (SYNTHROID, LEVOTHROID) 25 MCG tablet Take 25 mcg by mouth daily before breakfast.    . omeprazole (PRILOSEC) 20 MG capsule Take 1 capsule (20 mg total) by mouth 2 (two) times daily before a meal. 60 capsule 6  . Oxycodone HCl 20 MG TABS Take 1 tablet (20 mg total) by mouth 3 (three) times daily as needed (for pain). 90 tablet 0  . promethazine (PHENERGAN) 25 MG tablet Take 25 mg by mouth every 6 (six) hours as needed for nausea or  vomiting.    . saccharomyces boulardii (FLORASTOR) 250 MG capsule Take 250 mg by mouth 2 (two) times daily.    Marland Kitchen triamcinolone (NASACORT ALLERGY 24HR) 55 MCG/ACT AERO nasal inhaler Place 2 sprays into the nose daily as needed (for congestion).    . Vitamin D, Ergocalciferol, (DRISDOL) 50000 units CAPS capsule Take 50,000 Units by mouth once a week.     Current Facility-Administered Medications  Medication Dose Route Frequency Provider Last Rate Last Dose  . glipiZIDE (GLUCOTROL) tablet 2.5 mg  2.5 mg Oral BID AC Reyne Dumas, MD        PAST MEDICAL HISTORY: Past Medical History:  Diagnosis Date  . Anemia   . Cataracts, bilateral   . Chronic back pain    scoliosis/displacement of lumbosacral intervertebral disc/stenosis  . Depression    but doens't take any  meds  . Diabetes mellitus without complication ([place])    takes Metformin daily  . Family history of adverse reaction to anesthesia    pts mom would get short of breath after anesthesia but was a smoker  . Fibromyalgia    takes Gabapentin daily  . Fusion of lumbar spine   . GERD (gastroesophageal reflux disease)    takes Nexium daily  . Heart murmur   . Hemorrhoids   . History of blood transfusion 2013   no abnormal reaction noted  . History of bronchitis 2009  . History of colon polyps    benign  . History of hiatal hernia   . History of kidney stones    has a kidney stone now  . History of migraine    hasn't had one in over a yr  . History of surgery on arm    plates and screws  . Hypertension   . Hypothyroidism    takes Synthroid daily  . Idiopathic thrombocytopenia ([place])   . Insomnia    takes Restoril nightly as needed   . Memory loss   . Muscle spasm    takes Flexeril daily as needed  . Numbness and tingling    both toes  . PONV (postoperative nausea and vomiting)   . Shortness of breath dyspnea    with exertion but states its bc she can't exercise  . Urinary frequency   . Vitamin D deficiency     PAST SURGICAL HISTORY: Past Surgical History:  Procedure Laterality Date  . ABDOMINAL HYSTERECTOMY    . CARDIAC CATHETERIZATION  74yrs ago  . CATARACT EXTRACTION, BILATERAL    . CERVICAL FUSION    . COLONOSCOPY    . ESOPHAGOGASTRODUODENOSCOPY    . GASTRIC BYPASS    . JOINT REPLACEMENT Bilateral   . kidney stone removed    . KNEE ARTHROSCOPY     total of 7 between both knees  . left arm surgery     pins  . LUMBAR FUSION    . TONSILLECTOMY    . tummy tuck       FAMILY HISTORY: Family History  Problem Relation Age of Onset  . Lung cancer Mother   . COPD Mother   . Hypertension Mother   . Depression Mother   . Lymphoma Paternal Uncle   . Diabetes Maternal Grandmother     x 5 uncles  . Heart disease      Maternal and Paternal    SOCIAL  HISTORY:  Social History   Social History  . Marital status: Single    Spouse name: N/A  . Number of children: 1  .  Years of education: 33   Occupational History  . Retired Therapist, sports / Disabled    Social History Main Topics  . Smoking status: Never Smoker  . Smokeless tobacco: Never Used  . Alcohol use No  . Drug use: No  . Sexual activity: Not on file   Other Topics Concern  . Not on file   Social History Narrative   Lives at home alone.   Right handed.   Pot of coffee each morning.     PHYSICAL EXAM   Vitals:   07/02/16 1034  BP: 119/70  Pulse: 95  Weight: 156 lb (70.8 kg)  Height: 5\' 2"  (1.575 m)    Not recorded      Body mass index is 28.53 kg/m.  PHYSICAL EXAMNIATION:  Gen: NAD, conversant, well nourised, obese, well groomed                     Cardiovascular: Regular rate rhythm, no peripheral edema, warm, nontender. Eyes: Conjunctivae clear without exudates or hemorrhage Neck: Supple, no carotid bruise. Pulmonary: Clear to auscultation bilaterally   NEUROLOGICAL EXAM:  MENTAL STATUS: Speech:    Speech is normal; fluent and spontaneous with normal comprehension.  Cognition: Mini-Mental Status Examination is 29 out of 30, animal naming is 16,     Orientation to time, place and person     recent and remote memory: She missed one out of 3 recalls      Normal Attention span and concentration     Normal Language, naming, repeating,spontaneous speech     Fund of knowledge   CRANIAL NERVES: CN II: Visual fields are full to confrontation. Fundoscopic exam is normal with sharp discs and no vascular changes. Pupils are round equal and briskly reactive to light. CN III, IV, VI: extraocular movement are normal. No ptosis. CN V: Facial sensation is intact to pinprick in all 3 divisions bilaterally. Corneal responses are intact.  CN VII: Face is symmetric with normal eye closure and smile. CN VIII: Hearing is normal to rubbing fingers CN IX, X: Palate elevates  symmetrically. Phonation is normal. CN XI: Head turning and shoulder shrug are intact CN XII: Tongue is midline with normal movements and no atrophy.  MOTOR: She has moderate bilateral hip flexion weakness  REFLEXES: Reflexes are 1 and symmetric at the biceps, triceps, knees, and ankles. Plantar responses are flexor.  SENSORY: Mildly dense dependent light touch, pinprick,  and vibration sensation at toes   COORDINATION: Rapid alternating movements and fine finger movements are intact. There is no dysmetria on finger-to-nose and heel-knee-shin.    GAIT/STANCE: She needs push up to get up from seated position, cautious, stiff gait   DIAGNOSTIC DATA (LABS, IMAGING, TESTING) - I reviewed patient records, labs, notes, testing and imaging myself where available.   ASSESSMENT AND PLAN  [name] is a 65 y.o. 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 Joya Salm, to discuss potential decompression surgery  Marcial Pacas, M.D. Ph.D.  [facility] [address], [place], [place] [address] Ph: [phone] Fax: [phone]  CC: Referring Provider

## 2016-09-12 ENCOUNTER — Other Ambulatory Visit: Payer: Self-pay | Admitting: Physician Assistant

## 2016-09-12 DIAGNOSIS — G8310 Monoplegia of lower limb affecting unspecified side: Secondary | ICD-10-CM

## 2016-09-17 ENCOUNTER — Ambulatory Visit
Admission: RE | Admit: 2016-09-17 | Discharge: 2016-09-17 | Disposition: A | Discharge status: Home / Self Care | Payer: Medicare Other | Source: Ambulatory Visit | Attending: Physician Assistant | Admitting: Physician Assistant

## 2016-09-17 DIAGNOSIS — G8310 Monoplegia of lower limb affecting unspecified side: Secondary | ICD-10-CM

## 2016-10-09 ENCOUNTER — Inpatient Hospital Stay (HOSPITAL_COMMUNITY)
Admission: EM | Admit: 2016-10-09 | Discharge: 2016-10-17 | DRG: 981 | Disposition: A | Discharge status: Home Health | Payer: Medicare Other | Attending: Internal Medicine | Admitting: Internal Medicine

## 2016-10-09 ENCOUNTER — Emergency Department (HOSPITAL_COMMUNITY): Payer: Medicare Other

## 2016-10-09 ENCOUNTER — Encounter (HOSPITAL_COMMUNITY): Payer: Self-pay | Admitting: *Deleted

## 2016-10-09 DIAGNOSIS — E43 Unspecified severe protein-calorie malnutrition: Secondary | ICD-10-CM | POA: Diagnosis present

## 2016-10-09 DIAGNOSIS — D649 Anemia, unspecified: Secondary | ICD-10-CM | POA: Diagnosis present

## 2016-10-09 DIAGNOSIS — D61818 Other pancytopenia: Secondary | ICD-10-CM | POA: Diagnosis present

## 2016-10-09 DIAGNOSIS — G8929 Other chronic pain: Secondary | ICD-10-CM | POA: Diagnosis present

## 2016-10-09 DIAGNOSIS — R531 Weakness: Secondary | ICD-10-CM

## 2016-10-09 DIAGNOSIS — K219 Gastro-esophageal reflux disease without esophagitis: Secondary | ICD-10-CM | POA: Diagnosis present

## 2016-10-09 DIAGNOSIS — L02511 Cutaneous abscess of right hand: Secondary | ICD-10-CM | POA: Diagnosis not present

## 2016-10-09 DIAGNOSIS — W19XXXD Unspecified fall, subsequent encounter: Secondary | ICD-10-CM | POA: Diagnosis not present

## 2016-10-09 DIAGNOSIS — R296 Repeated falls: Secondary | ICD-10-CM | POA: Diagnosis present

## 2016-10-09 DIAGNOSIS — F329 Major depressive disorder, single episode, unspecified: Secondary | ICD-10-CM | POA: Diagnosis present

## 2016-10-09 DIAGNOSIS — Z9111 Patient's noncompliance with dietary regimen: Secondary | ICD-10-CM

## 2016-10-09 DIAGNOSIS — Z9884 Bariatric surgery status: Secondary | ICD-10-CM

## 2016-10-09 DIAGNOSIS — M419 Scoliosis, unspecified: Secondary | ICD-10-CM | POA: Diagnosis present

## 2016-10-09 DIAGNOSIS — E11649 Type 2 diabetes mellitus with hypoglycemia without coma: Secondary | ICD-10-CM | POA: Diagnosis present

## 2016-10-09 DIAGNOSIS — E559 Vitamin D deficiency, unspecified: Secondary | ICD-10-CM | POA: Diagnosis present

## 2016-10-09 DIAGNOSIS — M62838 Other muscle spasm: Secondary | ICD-10-CM | POA: Diagnosis present

## 2016-10-09 DIAGNOSIS — W19XXXA Unspecified fall, initial encounter: Secondary | ICD-10-CM | POA: Diagnosis present

## 2016-10-09 DIAGNOSIS — E039 Hypothyroidism, unspecified: Secondary | ICD-10-CM | POA: Diagnosis present

## 2016-10-09 DIAGNOSIS — Z86010 Personal history of colonic polyps: Secondary | ICD-10-CM

## 2016-10-09 DIAGNOSIS — Z23 Encounter for immunization: Secondary | ICD-10-CM

## 2016-10-09 DIAGNOSIS — G4700 Insomnia, unspecified: Secondary | ICD-10-CM | POA: Diagnosis present

## 2016-10-09 DIAGNOSIS — Z79899 Other long term (current) drug therapy: Secondary | ICD-10-CM

## 2016-10-09 DIAGNOSIS — Z981 Arthrodesis status: Secondary | ICD-10-CM

## 2016-10-09 DIAGNOSIS — I10 Essential (primary) hypertension: Secondary | ICD-10-CM | POA: Diagnosis present

## 2016-10-09 DIAGNOSIS — Z885 Allergy status to narcotic agent status: Secondary | ICD-10-CM

## 2016-10-09 DIAGNOSIS — M4316 Spondylolisthesis, lumbar region: Secondary | ICD-10-CM | POA: Diagnosis present

## 2016-10-09 DIAGNOSIS — H269 Unspecified cataract: Secondary | ICD-10-CM | POA: Diagnosis present

## 2016-10-09 DIAGNOSIS — Z888 Allergy status to other drugs, medicaments and biological substances status: Secondary | ICD-10-CM

## 2016-10-09 DIAGNOSIS — Z7952 Long term (current) use of systemic steroids: Secondary | ICD-10-CM

## 2016-10-09 DIAGNOSIS — Z79891 Long term (current) use of opiate analgesic: Secondary | ICD-10-CM

## 2016-10-09 DIAGNOSIS — I2699 Other pulmonary embolism without acute cor pulmonale: Secondary | ICD-10-CM | POA: Diagnosis present

## 2016-10-09 DIAGNOSIS — M797 Fibromyalgia: Secondary | ICD-10-CM | POA: Diagnosis present

## 2016-10-09 DIAGNOSIS — E1165 Type 2 diabetes mellitus with hyperglycemia: Secondary | ICD-10-CM | POA: Diagnosis present

## 2016-10-09 DIAGNOSIS — M170 Bilateral primary osteoarthritis of knee: Secondary | ICD-10-CM | POA: Diagnosis present

## 2016-10-09 DIAGNOSIS — M549 Dorsalgia, unspecified: Secondary | ICD-10-CM | POA: Diagnosis present

## 2016-10-09 DIAGNOSIS — T380X5A Adverse effect of glucocorticoids and synthetic analogues, initial encounter: Secondary | ICD-10-CM | POA: Diagnosis present

## 2016-10-09 LAB — COMPREHENSIVE METABOLIC PANEL
ALBUMIN: 2.5 g/dL — AB (ref 3.5–5.0)
ALK PHOS: 89 U/L (ref 38–126)
ALT: 32 U/L (ref 14–54)
ANION GAP: 13 (ref 5–15)
AST: 34 U/L (ref 15–41)
BILIRUBIN TOTAL: 1.7 mg/dL — AB (ref 0.3–1.2)
BUN: 20 mg/dL (ref 6–20)
CALCIUM: 8.5 mg/dL — AB (ref 8.9–10.3)
CO2: 19 mmol/L — ABNORMAL LOW (ref 22–32)
CREATININE: 0.89 mg/dL (ref 0.44–1.00)
Chloride: 106 mmol/L (ref 101–111)
GFR calc Af Amer: >60 mL/min (ref >60)
GFR calc non Af Amer: >60 mL/min (ref >60)
GLUCOSE: 55 mg/dL — AB (ref 65–99)
Potassium: 4.2 mmol/L (ref 3.5–5.1)
Sodium: 138 mmol/L (ref 135–145)
TOTAL PROTEIN: 5.3 g/dL — AB (ref 6.5–8.1)

## 2016-10-09 LAB — CBC WITH DIFFERENTIAL/PLATELET
BASOS ABS: 0 10*3/uL (ref 0.0–0.1)
BASOS PCT: 0 %
EOS PCT: 0 %
Eosinophils Absolute: 0 10*3/uL (ref 0.0–0.7)
HCT: 33.3 % — ABNORMAL LOW (ref 36.0–46.0)
Hemoglobin: 11.2 g/dL — ABNORMAL LOW (ref 12.0–15.0)
Lymphocytes Relative: 9 %
Lymphs Abs: 0.4 10*3/uL — ABNORMAL LOW (ref 0.7–4.0)
MCH: 31.3 pg (ref 26.0–34.0)
MCHC: 33.6 g/dL (ref 30.0–36.0)
MCV: 93 fL (ref 78.0–100.0)
MONO ABS: 0.6 10*3/uL (ref 0.1–1.0)
Monocytes Relative: 11 %
NEUTROS ABS: 4 10*3/uL (ref 1.7–7.7)
Neutrophils Relative %: 80 %
PLATELETS: 138 10*3/uL — AB (ref 150–400)
RBC: 3.58 MIL/uL — ABNORMAL LOW (ref 3.87–5.11)
RDW: 14.1 % (ref 11.5–15.5)
WBC: 5 10*3/uL (ref 4.0–10.5)

## 2016-10-09 LAB — I-STAT TROPONIN, ED: TROPONIN I, POC: 0.01 ng/mL (ref 0.00–0.08)

## 2016-10-09 LAB — D-DIMER, QUANTITATIVE: D-Dimer, Quant: 1.36 ug/mL-FEU — ABNORMAL HIGH (ref 0.00–0.50)

## 2016-10-09 LAB — CBG MONITORING, ED
GLUCOSE-CAPILLARY: 132 mg/dL — AB (ref 65–99)
GLUCOSE-CAPILLARY: 71 mg/dL (ref 65–99)
Glucose-Capillary: 44 mg/dL — CL (ref 65–99)

## 2016-10-09 MED ORDER — PNEUMOCOCCAL VAC POLYVALENT 25 MCG/0.5ML IJ INJ
0.5000 mL | INJECTION | INTRAMUSCULAR | Status: AC
  Administered 2016-10-17: 0.5 mL via INTRAMUSCULAR
  Filled 2016-10-09 (×2): qty 0.5

## 2016-10-09 MED ORDER — CYCLOBENZAPRINE HCL 10 MG PO TABS
10.0000 mg | ORAL_TABLET | Freq: Three times a day (TID) | ORAL | Status: DC | PRN
  Administered 2016-10-09 – 2016-10-17 (×6): 10 mg via ORAL
  Filled 2016-10-09 (×6): qty 1

## 2016-10-09 MED ORDER — GABAPENTIN 300 MG PO CAPS
300.0000 mg | ORAL_CAPSULE | Freq: Every day | ORAL | Status: DC
Start: 2016-10-09 — End: 2016-10-17
  Administered 2016-10-09 – 2016-10-16 (×8): 300 mg via ORAL
  Filled 2016-10-09 (×8): qty 1

## 2016-10-09 MED ORDER — MELOXICAM 7.5 MG PO TABS
7.5000 mg | ORAL_TABLET | Freq: Every day | ORAL | Status: DC
  Administered 2016-10-09 – 2016-10-17 (×9): 7.5 mg via ORAL
  Filled 2016-10-09 (×9): qty 1

## 2016-10-09 MED ORDER — PROMETHAZINE HCL 25 MG PO TABS
25.0000 mg | ORAL_TABLET | Freq: Four times a day (QID) | ORAL | Status: DC | PRN
  Administered 2016-10-11 – 2016-10-16 (×7): 25 mg via ORAL
  Filled 2016-10-09 (×7): qty 1

## 2016-10-09 MED ORDER — CEFAZOLIN IN D5W 1 GM/50ML IV SOLN
1.0000 g | Freq: Once | INTRAVENOUS | Status: AC
  Administered 2016-10-09: 1 g via INTRAVENOUS
  Filled 2016-10-09: qty 50

## 2016-10-09 MED ORDER — SODIUM CHLORIDE 0.9% FLUSH
3.0000 mL | Freq: Two times a day (BID) | INTRAVENOUS | Status: DC
  Administered 2016-10-09 – 2016-10-16 (×14): 3 mL via INTRAVENOUS

## 2016-10-09 MED ORDER — LIDOCAINE HCL (PF) 1 % IJ SOLN
5.0000 mL | Freq: Once | INTRAMUSCULAR | Status: AC
  Administered 2016-10-09: 5 mL
  Filled 2016-10-09: qty 30

## 2016-10-09 MED ORDER — CEFAZOLIN IN D5W 1 GM/50ML IV SOLN
1.0000 g | Freq: Three times a day (TID) | INTRAVENOUS | Status: DC
  Administered 2016-10-09 – 2016-10-12 (×8): 1 g via INTRAVENOUS
  Filled 2016-10-09 (×10): qty 50

## 2016-10-09 MED ORDER — IOPAMIDOL (ISOVUE-370) INJECTION 76%
100.0000 mL | Freq: Once | INTRAVENOUS | Status: AC | PRN
  Administered 2016-10-09: 70 mL via INTRAVENOUS

## 2016-10-09 MED ORDER — FLUTICASONE PROPIONATE 50 MCG/ACT NA SUSP
2.0000 | Freq: Every day | NASAL | Status: DC | PRN
  Filled 2016-10-09 (×2): qty 16

## 2016-10-09 MED ORDER — PANTOPRAZOLE SODIUM 40 MG PO TBEC
80.0000 mg | DELAYED_RELEASE_TABLET | Freq: Every day | ORAL | Status: DC
  Administered 2016-10-09 – 2016-10-17 (×9): 80 mg via ORAL
  Filled 2016-10-09 (×9): qty 2

## 2016-10-09 MED ORDER — ENOXAPARIN SODIUM 80 MG/0.8ML ~~LOC~~ SOLN
65.0000 mg | Freq: Two times a day (BID) | SUBCUTANEOUS | Status: DC
  Administered 2016-10-10: 65 mg via SUBCUTANEOUS
  Filled 2016-10-09 (×2): qty 0.8

## 2016-10-09 MED ORDER — LEVOTHYROXINE SODIUM 25 MCG PO TABS
25.0000 ug | ORAL_TABLET | Freq: Every day | ORAL | Status: DC
  Administered 2016-10-10 – 2016-10-17 (×8): 25 ug via ORAL
  Filled 2016-10-09 (×8): qty 1

## 2016-10-09 MED ORDER — ACETAMINOPHEN 325 MG PO TABS
650.0000 mg | ORAL_TABLET | Freq: Once | ORAL | Status: AC
  Administered 2016-10-09: 650 mg via ORAL
  Filled 2016-10-09: qty 2

## 2016-10-09 MED ORDER — OXYCODONE HCL 5 MG PO TABS
20.0000 mg | ORAL_TABLET | Freq: Three times a day (TID) | ORAL | Status: DC | PRN
  Administered 2016-10-09 – 2016-10-17 (×19): 20 mg via ORAL
  Filled 2016-10-09 (×19): qty 4

## 2016-10-09 MED ORDER — TRIAMCINOLONE ACETONIDE 55 MCG/ACT NA AERO: 2.0000 | INHALATION_SPRAY | Freq: Every day | NASAL | Status: DC | PRN

## 2016-10-09 MED ORDER — IOPAMIDOL (ISOVUE-370) INJECTION 76%
INTRAVENOUS | Status: AC
  Filled 2016-10-09: qty 100

## 2016-10-09 MED ORDER — SODIUM CHLORIDE 0.9 % IV BOLUS (SEPSIS)
500.0000 mL | Freq: Once | INTRAVENOUS | Status: AC
  Administered 2016-10-09: 500 mL via INTRAVENOUS

## 2016-10-09 MED ORDER — IBUPROFEN 800 MG PO TABS
800.0000 mg | ORAL_TABLET | Freq: Four times a day (QID) | ORAL | Status: DC | PRN
  Administered 2016-10-09 – 2016-10-15 (×5): 800 mg via ORAL
  Filled 2016-10-09 (×6): qty 1

## 2016-10-09 MED ORDER — ENOXAPARIN SODIUM 80 MG/0.8ML ~~LOC~~ SOLN
65.0000 mg | SUBCUTANEOUS | Status: AC
  Administered 2016-10-09: 65 mg via SUBCUTANEOUS
  Filled 2016-10-09: qty 0.8

## 2016-10-09 MED ORDER — TEMAZEPAM 15 MG PO CAPS: 30.0000 mg | ORAL_CAPSULE | Freq: Every evening | ORAL | Status: DC | PRN

## 2016-10-09 NOTE — Consult Note (Signed)
Reason for Consult:Hand infection  Referring Physician: ER  CC:My hand is infected  HPI:  [name] is an 66 y.o. right handed female who presents with   Several days of increasing pain and swelling to right dorsal hand.  Pt states she had blood drawn in that area a couple of days ago.     .   Pain is rated at   7 /10 and is described as sharp.  Pain is constant.  Pain is made better by rest/immobilization, worse with motion.   Associated signs/symptoms:  H/o cellulitis lower extremities, recently found out to have PE Previous treatment:    Past Medical History:  Diagnosis Date  . Anemia   . Cataracts, bilateral   . Chronic back pain    scoliosis/displacement of lumbosacral intervertebral disc/stenosis  . Depression    but doens't take any meds  . Diabetes mellitus without complication ([place])    takes Metformin daily  . Family history of adverse reaction to anesthesia    pts mom would get short of breath after anesthesia but was a smoker  . Fibromyalgia    takes Gabapentin daily  . Fusion of lumbar spine   . GERD (gastroesophageal reflux disease)    takes Nexium daily  . Heart murmur   . Hemorrhoids   . History of blood transfusion 2013   no abnormal reaction noted  . History of bronchitis 2009  . History of colon polyps    benign  . History of hiatal hernia   . History of kidney stones    has a kidney stone now  . History of migraine    hasn't had one in over a yr  . History of surgery on arm    plates and screws  . Hypertension   . Hypothyroidism    takes Synthroid daily  . Idiopathic thrombocytopenia ([place])   . Insomnia    takes Restoril nightly as needed   . Memory loss   . Muscle spasm    takes Flexeril daily as needed  . Numbness and tingling    both toes  . PONV (postoperative nausea and vomiting)   . Shortness of breath dyspnea    with exertion but states its bc she can't exercise  . Urinary frequency   . Vitamin D deficiency     Past Surgical  History:  Procedure Laterality Date  . ABDOMINAL HYSTERECTOMY    . CARDIAC CATHETERIZATION  51yr ago  . CATARACT EXTRACTION, BILATERAL    . CERVICAL FUSION    . COLONOSCOPY    . ESOPHAGOGASTRODUODENOSCOPY    . GASTRIC BYPASS    . JOINT REPLACEMENT Bilateral   . kidney stone removed    . KNEE ARTHROSCOPY     total of 7 between both knees  . left arm surgery     pins  . LUMBAR FUSION    . TONSILLECTOMY    . tummy tuck       Family History  Problem Relation Age of Onset  . Lung cancer Mother   . COPD Mother   . Hypertension Mother   . Depression Mother   . Lymphoma Paternal Uncle   . Diabetes Maternal Grandmother     x 5 uncles  . Heart disease      Maternal and Paternal    Social History:  reports that she has never smoked. She has never used smokeless tobacco. She reports that she does not drink alcohol or use drugs.  Allergies:  Allergies  Allergen Reactions  . Formaldehyde     Other reaction(s): Shortness Of Breath, coughing  . Altace [Ramipril] Cough  . Dilaudid [Hydromorphone] Itching    Drip OK, pills cause itching    Medications: I have reviewed the patient's current medications.  Results for orders placed or performed during the hospital encounter of 10/09/16 (from the past 48 hour(s))  Comprehensive metabolic panel     Status: Abnormal   Collection Time: 10/09/16 12:13 PM  Result Value Ref Range   Sodium 138 135 - 145 mmol/L   Potassium 4.2 3.5 - 5.1 mmol/L   Chloride 106 101 - 111 mmol/L   CO2 19 (L) 22 - 32 mmol/L   Glucose, Bld 55 (L) 65 - 99 mg/dL   BUN 20 6 - 20 mg/dL   Creatinine, Ser 0.89 0.44 - 1.00 mg/dL   Calcium 8.5 (L) 8.9 - 10.3 mg/dL   Total Protein 5.3 (L) 6.5 - 8.1 g/dL   Albumin 2.5 (L) 3.5 - 5.0 g/dL   AST 34 15 - 41 U/L   ALT 32 14 - 54 U/L   Alkaline Phosphatase 89 38 - 126 U/L   Total Bilirubin 1.7 (H) 0.3 - 1.2 mg/dL   GFR calc non Af Amer >60 >60 mL/min   GFR calc Af Amer >60 >60 mL/min    Comment: (NOTE) The eGFR has  been calculated using the CKD EPI equation. This calculation has not been validated in all clinical situations. eGFR's persistently <60 mL/min signify possible Chronic Kidney Disease.    Anion gap 13 5 - 15  CBC with Differential     Status: Abnormal   Collection Time: 10/09/16 12:13 PM  Result Value Ref Range   WBC 5.0 4.0 - 10.5 K/uL   RBC 3.58 (L) 3.87 - 5.11 MIL/uL   Hemoglobin 11.2 (L) 12.0 - 15.0 g/dL   HCT 33.3 (L) 36.0 - 46.0 %   MCV 93.0 78.0 - 100.0 fL   MCH 31.3 26.0 - 34.0 pg   MCHC 33.6 30.0 - 36.0 g/dL   RDW 14.1 11.5 - 15.5 %   Platelets 138 (L) 150 - 400 K/uL   Neutrophils Relative % 80 %   Neutro Abs 4.0 1.7 - 7.7 K/uL   Lymphocytes Relative 9 %   Lymphs Abs 0.4 (L) 0.7 - 4.0 K/uL   Monocytes Relative 11 %   Monocytes Absolute 0.6 0.1 - 1.0 K/uL   Eosinophils Relative 0 %   Eosinophils Absolute 0.0 0.0 - 0.7 K/uL   Basophils Relative 0 %   Basophils Absolute 0.0 0.0 - 0.1 K/uL  D-dimer, quantitative     Status: Abnormal   Collection Time: 10/09/16 12:13 PM  Result Value Ref Range   D-Dimer, Quant 1.36 (H) 0.00 - 0.50 ug/mL-FEU    Comment: (NOTE) At the manufacturer cut-off of 0.50 ug/mL FEU, this assay has been documented to exclude PE with a sensitivity and negative predictive value of 97 to 99%.  At this time, this assay has not been approved by the FDA to exclude DVT/VTE. Results should be correlated with clinical presentation.   I-stat troponin, ED     Status: None   Collection Time: 10/09/16  1:58 PM  Result Value Ref Range   Troponin i, poc 0.01 0.00 - 0.08 ng/mL   Comment 3            Comment: Due to the release kinetics of cTnI, a negative result within the first hours of the onset  of symptoms does not rule out myocardial infarction with certainty. If myocardial infarction is still suspected, repeat the test at appropriate intervals.   CBG monitoring, ED     Status: Abnormal   Collection Time: 10/09/16  2:35 PM  Result Value Ref Range    Glucose-Capillary 44 (LL) 65 - 99 mg/dL  CBG monitoring, ED     Status: Abnormal   Collection Time: 10/09/16  3:17 PM  Result Value Ref Range   Glucose-Capillary 132 (H) 65 - 99 mg/dL    Dg Chest 2 View  Result Date: 10/09/2016 CLINICAL DATA:  Tachycardia EXAM: CHEST  2 VIEW COMPARISON:  05/03/2016 FINDINGS: Normal heart size and mediastinal contours. There is no edema, consolidation, effusion, or pneumothorax. Remote anterior left fifth and sixth rib fractures. IMPRESSION: No active cardiopulmonary disease. Electronically Signed   By: Monte Fantasia M.D.   On: 10/09/2016 12:29   Ct Head Wo Contrast  Result Date: 10/09/2016 CLINICAL DATA:  Per EMS, pt from home, reports fell x 2 this am, the second time pt is with AMS, does not recall the previous fall and does not remember them coming to check on her when she fell the first time. pt. C/o headaches EXAM: CT HEAD WITHOUT CONTRAST TECHNIQUE: Contiguous axial images were obtained from the base of the skull through the vertex without intravenous contrast. COMPARISON:  CT head dated 05/03/2016. FINDINGS: Brain: All areas of the brain demonstrate normal gray-white matter attenuation. There is no mass, hemorrhage, edema or other evidence of acute parenchymal abnormality. No extra-axial hemorrhage. Ventricles are within normal limits in size and configuration. Vascular: There are chronic calcified atherosclerotic changes of the large vessels at the skull base. No unexpected hyperdense vessel. Skull: Normal. Negative for fracture or focal lesion. Sinuses/Orbits: No acute finding. Other: None. IMPRESSION: No acute findings.  No intracranial mass, hemorrhage or edema. Electronically Signed   By: Franki Cabot M.D.   On: 10/09/2016 14:25   Ct Angio Chest Pe W/cm &/or Wo Cm  Result Date: 10/09/2016 CLINICAL DATA:  Syncope with intermittent shortness of breath EXAM: CT ANGIOGRAPHY CHEST WITH CONTRAST TECHNIQUE: Multidetector CT imaging of the chest was performed  using the standard protocol during bolus administration of intravenous contrast. Multiplanar CT image reconstructions and MIPs were obtained to evaluate the vascular anatomy. CONTRAST:  70 mL Isovue 370 nonionic note pulmonary emboli on the left are noted. COMPARISON:  Chest radiograph October 09, 2016 FINDINGS: Cardiovascular: There are pulmonary emboli arising from the distal most aspect of the right main pulmonary artery extending into several right lower lobe pulmonary artery branches. Eighty pulmonary embolus is also noted in the proximal aspect of the posterior segment right upper lobe pulmonary artery. The right ventricle to left ventricle diameter ratio is less than 0.9, not indicative of right heart strain. There is no demonstrable thoracic aortic aneurysm or dissection. The visualized great vessels appear unremarkable. Note that the right and left common carotid arteries arise as a common trunk, an anatomic variant. There is atherosclerotic calcification in the aorta as well as in several regions of coronary artery. Pericardium is not thickened. Mediastinum/Nodes: Thyroid appears unremarkable. There are subcentimeter mediastinal lymph nodes but no adenopathy evident by size criteria. There is a focal hiatal hernia. Lungs/Pleura: On axial slice 49 series 7, there is a 3 mm nodular opacity in the anterior segment of the right upper lobe. On axial slice 71 series 7, there is a 4 mm nodular opacity in the anterior segment of the left lower  lobe. On axial slice 56 series 4, there is a 5 x 2 mm nodular opacity abutting the pleura in the medial segment of the right middle lobe. There is no appreciable edema or consolidation. There is mild left base atelectatic change. There is no pleural effusion or pleural thickening. Upper Abdomen: There is hepatic steatosis. There is cholelithiasis. There is atherosclerotic calcification in the upper abdominal aorta and major branch vessels. Musculoskeletal: There are no  blastic or lytic bone lesions. Review of the MIP images confirms the above findings. IMPRESSION: Pulmonary emboli in several proximal right upper lobe pulmonary artery branches as well as in the posterior segment right upper lobe pulmonary artery branch. Findings do not meet criteria for right heart strain. Multifocal atherosclerosis including foci of coronary artery calcification. Small nodular opacities, largest measuring 4 mm. No follow-up needed if patient is low-risk (and has no known or suspected primary neoplasm). Non-contrast chest CT can be considered in 12 months if patient is high-risk. This recommendation follows the consensus statement: Guidelines for Management of Incidental Pulmonary Nodules Detected on CT Images: From the Fleischner Society 2017; Radiology 2017; 284:228-243. Hiatal hernia present.  Cholelithiasis.  Hepatic steatosis. No evident adenopathy. Critical Value/emergent results were called by telephone at the time of interpretation on 10/09/2016 at 3:30 pm to Dr. Quintella Reichert , who verbally acknowledged these results. Electronically Signed   By: Lowella Grip III M.D.   On: 10/09/2016 15:31   Dg Hand Complete Right  Result Date: 10/09/2016 CLINICAL DATA:  66 year old female with history of blood draw in early November 2017 with hematoma over the second third metacarpal phalangeal joints. Recent pain in this area over the past 2-3 days. EXAM: RIGHT HAND - COMPLETE 3+ VIEW COMPARISON:  No priors. FINDINGS: Three views of the right hand demonstrate extensive soft tissue swelling dorsal to the MCP joints. No retained radiopaque foreign body in the underlying soft tissues. No acute displaced fracture, subluxation or dislocation. No aggressive appearing lytic or blastic osseous lesions. IMPRESSION: 1. Extensive soft tissue swelling dorsal to the MCP joints without evidence of underlying acute bony abnormality. No retained radiopaque foreign body. Electronically Signed   By: Vinnie Langton M.D.   On: 10/09/2016 12:22    Pertinent items are noted in HPI. Temp:  [97.6 F (36.4 C)] 97.6 F (36.4 C) (01/02 1103) Pulse Rate:  [108-118] 115 (01/02 1730) Resp:  [12-23] 18 (01/02 1730) BP: (112-170)/(54-105) 158/74 (01/02 1730) SpO2:  [97 %-100 %] 97 % (01/02 1730) Weight:  [65.3 kg (144 lb)] 65.3 kg (144 lb) (01/02 1105) General appearance: alert and cooperative Cardio: regular rate and rhythm Extremities: R dorsal hand: 3cm swelling, with surrounding erythema just proximal to mcpj of LF, with fluctuance;    Assessment: Abscess of right dorsal hand Plan: Needs I&D I have discussed this treatment plan in detail with patient, including the risks of the recommended treatment or surgery, the benefits and the alternatives.  The patient understands that additional treatment may be necessary.  Procedure: Hand anesthetized with 1% plain lidocaine, prepped with betadine, a small incision was made over the fluctuant area, some purulent material was expressed, irrigation of the area was performed, the wound was packed with iodoform gauze and a sterile dressing was applied.  [place] Kinyon Vivien Presto 10/09/2016, 6:13 PM

## 2016-10-09 NOTE — Progress Notes (Signed)
ANTICOAGULATION CONSULT NOTE - Initial Consult  Pharmacy Consult for Lovenox Indication: pulmonary embolus  Allergies  Allergen Reactions  . Formaldehyde     Other reaction(s): Shortness Of Breath, coughing  . Altace [Ramipril] Cough  . Dilaudid [Hydromorphone] Itching    Drip OK, pills cause itching    Patient Measurements: Height: 5\' 2"  (157.5 cm) Weight: 144 lb (65.3 kg) IBW/kg (Calculated) : 50.1  Vital Signs: Temp: 97.6 F (36.4 C) (01/02 1103) Temp Source: Oral (01/02 1103) BP: 154/83 (01/02 1600) Pulse Rate: 115 (01/02 1600)  Labs:  Recent Labs  10/09/16 1213  HGB 11.2*  HCT 33.3*  PLT 138*  CREATININE 0.89    Estimated Creatinine Clearance: 55.2 mL/min (by C-G formula based on SCr of 0.89 mg/dL).   Medical History: Past Medical History:  Diagnosis Date  . Anemia   . Cataracts, bilateral   . Chronic back pain    scoliosis/displacement of lumbosacral intervertebral disc/stenosis  . Depression    but doens't take any meds  . Diabetes mellitus without complication ([place])    takes Metformin daily  . Family history of adverse reaction to anesthesia    pts mom would get short of breath after anesthesia but was a smoker  . Fibromyalgia    takes Gabapentin daily  . Fusion of lumbar spine   . GERD (gastroesophageal reflux disease)    takes Nexium daily  . Heart murmur   . Hemorrhoids   . History of blood transfusion 2013   no abnormal reaction noted  . History of bronchitis 2009  . History of colon polyps    benign  . History of hiatal hernia   . History of kidney stones    has a kidney stone now  . History of migraine    hasn't had one in over a yr  . History of surgery on arm    plates and screws  . Hypertension   . Hypothyroidism    takes Synthroid daily  . Idiopathic thrombocytopenia ([place])   . Insomnia    takes Restoril nightly as needed   . Memory loss   . Muscle spasm    takes Flexeril daily as needed  . Numbness and tingling    both toes  . PONV (postoperative nausea and vomiting)   . Shortness of breath dyspnea    with exertion but states its bc she can't exercise  . Urinary frequency   . Vitamin D deficiency     Medications:  Scheduled:  . iopamidol      . [START ON 10/10/2016] pneumococcal 23 valent vaccine  0.5 mL Intramuscular Tomorrow-1000   Infusions:  .  ceFAZolin (ANCEF) IV      Assessment: 24 yoF presented to ED from home on 10/09/16 after falling twice and complaints of hand pain/swelling/redness where she had a blood draw.  CTa shows pulmonary emboli in several proximal right upper lobe pulmonary artery branches as well as in the posterior segment right upper lobe pulmonary artery branch.  Pharmacy is consulted to dose Lovenox for PE.  PMH includes anemia and idiopathic thrombocytopenia.  No prior to admission anticoagulants are noted, but she is taking Estradiol 1mg  PO daily.  Today, 10/09/2016: CBC:  Hgb 11.2, Plt 138 SCr 0.89, CrCl ~ 55 ml/min     Goal of Therapy:  Anti-Xa level 0.6-1 units/ml 4hrs after LMWH dose given Monitor platelets by anticoagulation protocol: Yes   Plan:   Lovenox 1 mg/kg (65 mg) SQ q12h  Follow up renal function  and CBC peripherally.  Dosage remains stable and need for further dosage adjustment appears unlikely at present.  Pharmacy will sign off at this time.  Please reconsult if a change in clinical status warrants re-evaluation of dosage.   Gretta Arab PharmD, BCPS Pager [phone] 10/09/2016 4:26 PM

## 2016-10-09 NOTE — ED Notes (Signed)
Patient transported to CT 

## 2016-10-09 NOTE — ED Triage Notes (Addendum)
Per EMS, pt from home, reports fell x 2 this am, the second time pt is with AMS, does not recall the previous fall and does not remember them coming to check on her when she fell the first time.  EMS also reports noticing a hematoma on her R posterior hand and is red and warm to touch.  Pt reports she was at her doctor's office a week ago and was stuck for blood tests in her R hand.  Pt states she has hx of sepsis.  Pt is A&Ox 4.

## 2016-10-09 NOTE — ED Notes (Signed)
Patient given meal tray, soup, and ginger ale.

## 2016-10-09 NOTE — ED Notes (Signed)
Hand surgery at bedside.

## 2016-10-09 NOTE — H&P (Signed)
History and Physical   JANIHA CASTRILLON [id number] DOB: January 15, 1951 DOA: 10/09/2016  Referring MD/NP/PA: Dr. Ralene Bathe, EDP PCP: Joya Gaskins, MD Outpatient Specialists: Dr. Gershon Crane, ophthalmology; Dr. Krista Blue, neurology  Patient coming from: Home  Chief Complaint: Falls, shortness of breath, right hand pain.  HPI: Kelli CHRISCO is a 66 y.o. female with a history of anemia, chronic back pain, fibromyalgia, s/p multiple orthopedic surgeries, depression, GERD, HTN, T2DM, hypothyroidism, and lichen planus who presented to the ED for frequent falls and shortness of breath.   She reports frequent falls without preceding symptoms, described as just due to sudden weakness in her legs. She was admitted in July 2017 for this and discharged to SNF for rehabilitation. She denies LOC, but has a hard time remembering the details of these events.   She has chronic pain due to osteoarthritis in her knees and back for which she takes oxycodone and ibuprofen. Her shortness of breath has worsened over the past 2 - 3 days, is intermittent, with exertion only and is not associated with chest pain or palpitations.   On arrival she was afebrile, 100% on room air and tachycardic at 122bpm. BP 165/76. Orthostatics were attempted but she was unable to stand. ED work up showed right-sided PE's on CT. She also reported right hand pain worsening for 2 months, becoming red, swollen and painful following labwork drawn from right hand dorsum. Abscess was noted on exam and I&D'ed by EDP. Antibiotics were started and hand surgery was consulted.   Review of Systems: Otherwise per HPI. All others reviewed and are negative.   Past Medical History:  Diagnosis Date  . Anemia   . Cataracts, bilateral   . Chronic back pain    scoliosis/displacement of lumbosacral intervertebral disc/stenosis  . Depression    but doens't take any meds  . Diabetes mellitus without complication ([place])    takes Metformin daily  . Family history of  adverse reaction to anesthesia    pts mom would get short of breath after anesthesia but was a smoker  . Fibromyalgia    takes Gabapentin daily  . Fusion of lumbar spine   . GERD (gastroesophageal reflux disease)    takes Nexium daily  . Heart murmur   . Hemorrhoids   . History of blood transfusion 2013   no abnormal reaction noted  . History of bronchitis 2009  . History of colon polyps    benign  . History of hiatal hernia   . History of kidney stones    has a kidney stone now  . History of migraine    hasn't had one in over a yr  . History of surgery on arm    plates and screws  . Hypertension   . Hypothyroidism    takes Synthroid daily  . Idiopathic thrombocytopenia ([place])   . Insomnia    takes Restoril nightly as needed   . Memory loss   . Muscle spasm    takes Flexeril daily as needed  . Numbness and tingling    both toes  . PONV (postoperative nausea and vomiting)   . Shortness of breath dyspnea    with exertion but states its bc she can't exercise  . Urinary frequency   . Vitamin D deficiency     Past Surgical History:  Procedure Laterality Date  . ABDOMINAL HYSTERECTOMY    . CARDIAC CATHETERIZATION  79yrs ago  . CATARACT EXTRACTION, BILATERAL    . CERVICAL FUSION    . COLONOSCOPY    .  ESOPHAGOGASTRODUODENOSCOPY    . GASTRIC BYPASS    . JOINT REPLACEMENT Bilateral   . kidney stone removed    . KNEE ARTHROSCOPY     total of 7 between both knees  . left arm surgery     pins  . LUMBAR FUSION    . TONSILLECTOMY    . tummy tuck       - Never smoker, RN x38 years, doesn't drink alcohol. Lives alone, has a yorkie terrier.   reports that she has never smoked. She has never used smokeless tobacco. She reports that she does not drink alcohol or use drugs.  Allergies  Allergen Reactions  . Formaldehyde     Other reaction(s): Shortness Of Breath, coughing  . Altace [Ramipril] Cough  . Dilaudid [Hydromorphone] Itching    Drip OK, pills cause itching     Family History  Problem Relation Age of Onset  . Lung cancer Mother   . COPD Mother   . Hypertension Mother   . Depression Mother   . Lymphoma Paternal Uncle   . Diabetes Maternal Grandmother     x 5 uncles  . Heart disease      Maternal and Paternal   - Family history otherwise reviewed and not pertinent.  Prior to Admission medications   Medication Sig Start Date End Date Taking? Authorizing Provider  cyclobenzaprine (FLEXERIL) 10 MG tablet Take 10 mg by mouth 3 (three) times daily as needed for muscle spasms.   Yes Historical Provider, MD  estradiol (ESTRACE) 1 MG tablet Take 1 mg by mouth daily.    Yes Historical Provider, MD  gabapentin (NEURONTIN) 300 MG capsule Take 1 capsule (300 mg total) by mouth at bedtime. 05/07/16  Yes Reyne Dumas, MD  ibuprofen (ADVIL,MOTRIN) 200 MG tablet Take 800 mg by mouth every 6 (six) hours as needed (pain).   Yes Historical Provider, MD  levothyroxine (SYNTHROID, LEVOTHROID) 25 MCG tablet Take 25 mcg by mouth daily before breakfast.   Yes Historical Provider, MD  meloxicam (MOBIC) 7.5 MG tablet Take 7.5 mg by mouth daily. 09/27/16  Yes Historical Provider, MD  omeprazole (PRILOSEC) 20 MG capsule Take 1 capsule (20 mg total) by mouth 2 (two) times daily before a meal. 03/01/15  Yes Lafayette Dragon, MD  Oxycodone HCl 20 MG TABS Take 1 tablet (20 mg total) by mouth 3 (three) times daily as needed (for pain). 05/10/16  Yes Tiffany L Reed, DO  promethazine (PHENERGAN) 25 MG tablet Take 25 mg by mouth every 6 (six) hours as needed for nausea or vomiting.   Yes Historical Provider, MD  saccharomyces boulardii (FLORASTOR) 250 MG capsule Take 250 mg by mouth 2 (two) times daily.   Yes Historical Provider, MD  temazepam (RESTORIL) 30 MG capsule Take 30 mg by mouth at bedtime. 08/29/16  Yes Historical Provider, MD  triamcinolone (NASACORT ALLERGY 24HR) 55 MCG/ACT AERO nasal inhaler Place 2 sprays into the nose daily as needed (for congestion).   Yes Historical  Provider, MD  Vitamin D, Ergocalciferol, (DRISDOL) 50000 units CAPS capsule Take 50,000 Units by mouth once a week. 02/03/15  Yes Historical Provider, MD    Physical Exam: Vitals:   10/09/16 1600 10/09/16 1630 10/09/16 1700 10/09/16 1709  BP: 154/83 166/64 141/66 158/71  Pulse: 115 114 113 115  Resp: 12 22 14 23   Temp:      TempSrc:      SpO2: 98% 100% 100% 97%  Weight:      Height:  Constitutional: 66 y.o. cushingoid F in no distress, calm demeanor Eyes: Lids and conjunctivae normal, PERRL ENMT: Mucous membranes are moist. Posterior pharynx clear of any exudate or lesions.  Neck: normal, supple, no masses, no thyromegaly Respiratory: Non-labored breathing room air without accessory muscle use. Clear breath sounds to auscultation bilaterally Cardiovascular: Tachycardic rate and regular rhythm, no murmurs, rubs, or gallops. No carotid bruits. No JVD. 1+ bilateral LE edema. 2+ pedal pulses. Abdomen: Normoactive bowel sounds. No tenderness, non-distended, and no masses palpated. No hepatosplenomegaly. GU: No indwelling catheter Musculoskeletal: No clubbing / cyanosis. No joint deformity upper and lower extremities. Good ROM, no contractures. Normal muscle tone.  Skin: Warm, dry. Has scattered ecchymoses without active bleeding. Bilateral legs with diffuse dryness and erythema. Right hand dorsum just proximal to MCP of 4th digit is well-demarcated erythematous swelling. Neurologic: CN II-XII grossly intact. Gait not assessed. Speech normal. No focal deficits in motor strength or sensation in all extremities. DTRs 2+.  Psychiatric: Alert and oriented x3. Normal judgment and insight. Mood euthymic with scattered thought process and congruent affect.   Labs on Admission: I have personally reviewed following labs and imaging studies  CBC:  Recent Labs Lab 10/09/16 1213  WBC 5.0  NEUTROABS 4.0  HGB 11.2*  HCT 33.3*  MCV 93.0  PLT 0000000*   Basic Metabolic Panel:  Recent Labs Lab  10/09/16 1213  NA 138  K 4.2  CL 106  CO2 19*  GLUCOSE 55*  BUN 20  CREATININE 0.89  CALCIUM 8.5*   GFR: Estimated Creatinine Clearance: 55.2 mL/min (by C-G formula based on SCr of 0.89 mg/dL). Liver Function Tests:  Recent Labs Lab 10/09/16 1213  AST 34  ALT 32  ALKPHOS 89  BILITOT 1.7*  PROT 5.3*  ALBUMIN 2.5*   CBG:  Recent Labs Lab 10/09/16 1435 10/09/16 1517  GLUCAP 44* 132*   Urine analysis:    Component Value Date/Time   COLORURINE AMBER (A) 05/03/2016 1712   APPEARANCEUR CLEAR 05/03/2016 1712   LABSPEC 1.020 05/03/2016 1712   PHURINE 5.0 05/03/2016 1712   GLUCOSEU NEGATIVE 05/03/2016 1712   HGBUR NEGATIVE 05/03/2016 1712   BILIRUBINUR SMALL (A) 05/03/2016 1712   KETONESUR 15 (A) 05/03/2016 1712   PROTEINUR NEGATIVE 05/03/2016 1712   NITRITE NEGATIVE 05/03/2016 1712   LEUKOCYTESUR NEGATIVE 05/03/2016 1712   Radiological Exams on Admission: Dg Chest 2 View  Result Date: 10/09/2016 CLINICAL DATA:  Tachycardia EXAM: CHEST  2 VIEW COMPARISON:  05/03/2016 FINDINGS: Normal heart size and mediastinal contours. There is no edema, consolidation, effusion, or pneumothorax. Remote anterior left fifth and sixth rib fractures. IMPRESSION: No active cardiopulmonary disease. Electronically Signed   By: Monte Fantasia M.D.   On: 10/09/2016 12:29   Ct Head Wo Contrast  Result Date: 10/09/2016 CLINICAL DATA:  Per EMS, pt from home, reports fell x 2 this am, the second time pt is with AMS, does not recall the previous fall and does not remember them coming to check on her when she fell the first time. pt. C/o headaches EXAM: CT HEAD WITHOUT CONTRAST TECHNIQUE: Contiguous axial images were obtained from the base of the skull through the vertex without intravenous contrast. COMPARISON:  CT head dated 05/03/2016. FINDINGS: Brain: All areas of the brain demonstrate normal gray-white matter attenuation. There is no mass, hemorrhage, edema or other evidence of acute parenchymal  abnormality. No extra-axial hemorrhage. Ventricles are within normal limits in size and configuration. Vascular: There are chronic calcified atherosclerotic changes of the large vessels  at the skull base. No unexpected hyperdense vessel. Skull: Normal. Negative for fracture or focal lesion. Sinuses/Orbits: No acute finding. Other: None. IMPRESSION: No acute findings.  No intracranial mass, hemorrhage or edema. Electronically Signed   By: Franki Cabot M.D.   On: 10/09/2016 14:25   Ct Angio Chest Pe W/cm &/or Wo Cm  Result Date: 10/09/2016 CLINICAL DATA:  Syncope with intermittent shortness of breath EXAM: CT ANGIOGRAPHY CHEST WITH CONTRAST TECHNIQUE: Multidetector CT imaging of the chest was performed using the standard protocol during bolus administration of intravenous contrast. Multiplanar CT image reconstructions and MIPs were obtained to evaluate the vascular anatomy. CONTRAST:  70 mL Isovue 370 nonionic note pulmonary emboli on the left are noted. COMPARISON:  Chest radiograph October 09, 2016 FINDINGS: Cardiovascular: There are pulmonary emboli arising from the distal most aspect of the right main pulmonary artery extending into several right lower lobe pulmonary artery branches. Eighty pulmonary embolus is also noted in the proximal aspect of the posterior segment right upper lobe pulmonary artery. The right ventricle to left ventricle diameter ratio is less than 0.9, not indicative of right heart strain. There is no demonstrable thoracic aortic aneurysm or dissection. The visualized great vessels appear unremarkable. Note that the right and left common carotid arteries arise as a common trunk, an anatomic variant. There is atherosclerotic calcification in the aorta as well as in several regions of coronary artery. Pericardium is not thickened. Mediastinum/Nodes: Thyroid appears unremarkable. There are subcentimeter mediastinal lymph nodes but no adenopathy evident by size criteria. There is a focal hiatal  hernia. Lungs/Pleura: On axial slice 49 series 7, there is a 3 mm nodular opacity in the anterior segment of the right upper lobe. On axial slice 71 series 7, there is a 4 mm nodular opacity in the anterior segment of the left lower lobe. On axial slice 56 series 4, there is a 5 x 2 mm nodular opacity abutting the pleura in the medial segment of the right middle lobe. There is no appreciable edema or consolidation. There is mild left base atelectatic change. There is no pleural effusion or pleural thickening. Upper Abdomen: There is hepatic steatosis. There is cholelithiasis. There is atherosclerotic calcification in the upper abdominal aorta and major branch vessels. Musculoskeletal: There are no blastic or lytic bone lesions. Review of the MIP images confirms the above findings. IMPRESSION: Pulmonary emboli in several proximal right upper lobe pulmonary artery branches as well as in the posterior segment right upper lobe pulmonary artery branch. Findings do not meet criteria for right heart strain. Multifocal atherosclerosis including foci of coronary artery calcification. Small nodular opacities, largest measuring 4 mm. No follow-up needed if patient is low-risk (and has no known or suspected primary neoplasm). Non-contrast chest CT can be considered in 12 months if patient is high-risk. This recommendation follows the consensus statement: Guidelines for Management of Incidental Pulmonary Nodules Detected on CT Images: From the Fleischner Society 2017; Radiology 2017; 284:228-243. Hiatal hernia present.  Cholelithiasis.  Hepatic steatosis. No evident adenopathy. Critical Value/emergent results were called by telephone at the time of interpretation on 10/09/2016 at 3:30 pm to Dr. Quintella Reichert , who verbally acknowledged these results. Electronically Signed   By: Lowella Grip III M.D.   On: 10/09/2016 15:31   Dg Hand Complete Right  Result Date: 10/09/2016 CLINICAL DATA:  66 year old female with history of  blood draw in early November 2017 with hematoma over the second third metacarpal phalangeal joints. Recent pain in this area over the  past 2-3 days. EXAM: RIGHT HAND - COMPLETE 3+ VIEW COMPARISON:  No priors. FINDINGS: Three views of the right hand demonstrate extensive soft tissue swelling dorsal to the MCP joints. No retained radiopaque foreign body in the underlying soft tissues. No acute displaced fracture, subluxation or dislocation. No aggressive appearing lytic or blastic osseous lesions. IMPRESSION: 1. Extensive soft tissue swelling dorsal to the MCP joints without evidence of underlying acute bony abnormality. No retained radiopaque foreign body. Electronically Signed   By: Vinnie Langton M.D.   On: 10/09/2016 12:22   EKG: Independently reviewed. Sinus tachycardia with significant artifact. No definite ST depression/elevations  Assessment/Plan Active Problems:   Pulmonary embolism (HCC)   Pulmonary Emboli: Confirmed on CT Chest without radiographic evidence of right heart strain. - Given lovenox in ED, will transition to po 1/3.  - LE dopplers ordered, given the swelling - Follow up echocardiogram  Weakness/falls: Becoming a chronic problem, evaluated in outpatient by Dr. Krista Blue. Was at SNF following last admission. Couldn't get up for orthostatics in ED. CT head negative. - Up with assistance - PT/OT - Consider neuro  Right hand abscess: s/p I&D in ED - Empiric antibiotics - Follow wound culture - Hand consulted by EDP  Hypoglycemia: CBG 44 in ED improved with po. ?adrenal insufficiency in setting of chronic steroid use for ?lichen planus per dermatology.  - AM cortisol - Check CBGs q6h until sure they are stable.  Chronic low back pain/fibromyalgia/lumbar spondylolisthesis: - Continue gabapentin 600 mg by mouth 3 times a day. - Continue Flexeril 10 mg by mouth 3 times a day as needed. - Continue oxycodone 20 mg by mouth 3 times a day as needed.  Hypothyroidism: TSH wnl July  2017 - Continue levothyroxine 25 g daily..  Hypertension: Chronic, stable - Continue home medications. No AKI.    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  - Monitor CBG as above.  Anemia: Chronic, stable. Hgb 11.2, normocytic indices on admission. - Monitor  GERD: Chronic, stable. - Continue PPI  DVT prophylaxis: Lovenox  Code Status: Full  Family Communication: None at bedside Disposition Plan: Observe overnight on telemetry, anticipate discharge back to home, pending therapy evaluations Consults called: Hand surgery consulted by EDP Admission status: Observation    Vance Gather, MD [facility] Pager [phone]  If 7PM-7AM, please contact night-coverage [email or web address] Password [email or web address] 10/09/2016, 5:50 PM

## 2016-10-09 NOTE — ED Notes (Signed)
MD made aware of CBG. Patient given orange juice.

## 2016-10-09 NOTE — ED Notes (Signed)
Bed: WA21 Expected date:  Expected time:  Means of arrival:  Comments: EMS 66 yo, fall

## 2016-10-09 NOTE — ED Notes (Signed)
Patient transported to X-ray 

## 2016-10-09 NOTE — ED Provider Notes (Signed)
[facility] DEPT Provider Note   CSN: [id number] Arrival date & time: 10/09/16  1051     History   Chief Complaint Chief Complaint  Patient presents with  . Fall    HPI [name] is a 66 y.o. female.  The history is provided by the patient. No language interpreter was used.  Fall    [name] is a 65 y.o. female who presents to the Emergency Department complaining of hand pain.  She presents for evaluation of hand pain. She had a blood draw back in November between her third and fourth digits on her right hand. Since that time she has had some local swelling and pain. Over the last 5 days she's had significant worsening of the swelling and pain and now there is associated redness and she feels a firm swelling on the back of her hand. She has difficulty moving her fingers secondary to pain. She is currently followed by the wound center for cellulitis(legs)and has a history of sepsis. She states that she has been having frequent falls at home and last fell this morning but does not recall the event. She denies any fevers, chest pain shortness of breath, abdominal pain, nausea, vomiting. Past Medical History:  Diagnosis Date  . Anemia   . Cataracts, bilateral   . Chronic back pain    scoliosis/displacement of lumbosacral intervertebral disc/stenosis  . Depression    but doens't take any meds  . Diabetes mellitus without complication ([place])    takes Metformin daily  . Family history of adverse reaction to anesthesia    pts mom would get short of breath after anesthesia but was a smoker  . Fibromyalgia    takes Gabapentin daily  . Fusion of lumbar spine   . GERD (gastroesophageal reflux disease)    takes Nexium daily  . Heart murmur   . Hemorrhoids   . History of blood transfusion 2013   no abnormal reaction noted  . History of bronchitis 2009  . History of colon polyps    benign  . History of hiatal hernia   . History of kidney stones    has a kidney stone  now  . History of migraine    hasn't had one in over a yr  . History of surgery on arm    plates and screws  . Hypertension   . Hypothyroidism    takes Synthroid daily  . Idiopathic thrombocytopenia ([place])   . Insomnia    takes Restoril nightly as needed   . Memory loss   . Muscle spasm    takes Flexeril daily as needed  . Numbness and tingling    both toes  . PONV (postoperative nausea and vomiting)   . Shortness of breath dyspnea    with exertion but states its bc she can't exercise  . Urinary frequency   . Vitamin D deficiency     Patient Active Problem List   Diagnosis Date Noted  . 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 Acute renal failure (ARF) ([place])   . Fall   . Chronic low back pain   . Radiculopathy, thoracolumbar region   . Failed back syndrome   . Anemia due to bone marrow failure ([place])   . Protein-calorie malnutrition, severe ([place]) 05/05/2016  . AKI (acute kidney injury) ([place]) 05/03/2016  . Hyperkalemia 05/03/2016  . Hypothyroidism 05/03/2016  . Hypertension 05/03/2016  . Rhabdomyolysis 05/03/2016  . Diabetes mellitus type 2 in obese ([place])  05/03/2016  . Anemia 05/03/2016  . GERD (gastroesophageal reflux disease) 05/03/2016  . Fibromyalgia 05/03/2016  . Hyponatremia 05/03/2016  . Weakness 05/03/2016  . Neck pain 02/16/2016  . Muscle cramp 11/09/2015  . Left-sided weakness 08/29/2015  . Spondylolisthesis of lumbar region 02/08/2015    Past Surgical History:  Procedure Laterality Date  . ABDOMINAL HYSTERECTOMY    . CARDIAC CATHETERIZATION  90yrs ago  . CATARACT EXTRACTION, BILATERAL    . CERVICAL FUSION    . COLONOSCOPY    . ESOPHAGOGASTRODUODENOSCOPY    . GASTRIC BYPASS    . JOINT REPLACEMENT Bilateral   . kidney stone removed    . KNEE ARTHROSCOPY     total of 7 between both knees  . left arm surgery     pins  . LUMBAR FUSION    . TONSILLECTOMY    . tummy tuck       OB History    No data available        Home Medications    Prior to Admission medications   Medication Sig Start Date End Date Taking? Authorizing Provider  cyclobenzaprine (FLEXERIL) 10 MG tablet Take 10 mg by mouth 3 (three) times daily as needed for muscle spasms.   Yes Historical Provider, MD  estradiol (ESTRACE) 1 MG tablet Take 1 mg by mouth daily.    Yes Historical Provider, MD  gabapentin (NEURONTIN) 300 MG capsule Take 1 capsule (300 mg total) by mouth at bedtime. 05/07/16  Yes Reyne Dumas, MD  ibuprofen (ADVIL,MOTRIN) 200 MG tablet Take 800 mg by mouth every 6 (six) hours as needed (pain).   Yes Historical Provider, MD  levothyroxine (SYNTHROID, LEVOTHROID) 25 MCG tablet Take 25 mcg by mouth daily before breakfast.   Yes Historical Provider, MD  meloxicam (MOBIC) 7.5 MG tablet Take 7.5 mg by mouth daily. 09/27/16  Yes Historical Provider, MD  omeprazole (PRILOSEC) 20 MG capsule Take 1 capsule (20 mg total) by mouth 2 (two) times daily before a meal. 03/01/15  Yes Lafayette Dragon, MD  Oxycodone HCl 20 MG TABS Take 1 tablet (20 mg total) by mouth 3 (three) times daily as needed (for pain). 05/10/16  Yes Tiffany L Reed, DO  promethazine (PHENERGAN) 25 MG tablet Take 25 mg by mouth every 6 (six) hours as needed for nausea or vomiting.   Yes Historical Provider, MD  saccharomyces boulardii (FLORASTOR) 250 MG capsule Take 250 mg by mouth 2 (two) times daily.   Yes Historical Provider, MD  temazepam (RESTORIL) 30 MG capsule Take 30 mg by mouth at bedtime. 08/29/16  Yes Historical Provider, MD  triamcinolone (NASACORT ALLERGY 24HR) 55 MCG/ACT AERO nasal inhaler Place 2 sprays into the nose daily as needed (for congestion).   Yes Historical Provider, MD  Vitamin D, Ergocalciferol, (DRISDOL) 50000 units CAPS capsule Take 50,000 Units by mouth once a week. 02/03/15  Yes Historical Provider, MD    Family History Family History  Problem Relation Age of Onset  . Lung cancer Mother   . COPD Mother   . Hypertension Mother   .  Depression Mother   . Lymphoma Paternal Uncle   . Diabetes Maternal Grandmother     x 5 uncles  . Heart disease      Maternal and Paternal    Social History Social History  Substance Use Topics  . Smoking status: Never Smoker  . Smokeless tobacco: Never Used  . Alcohol use No     Allergies   Formaldehyde; Altace [ramipril]; and  Dilaudid [hydromorphone]   Review of Systems Review of Systems  All other systems reviewed and are negative.    Physical Exam Updated Vital Signs BP 170/99 (BP Location: Right Arm)   Pulse 114   Temp 97.6 F (36.4 C) (Oral)   Resp 17   Ht 5\' 2"  (1.575 m)   Wt 144 lb (65.3 kg)   SpO2 99%   BMI 26.34 kg/m   Physical Exam  Constitutional: She is oriented to person, place, and time. She appears well-developed.  Chronically ill-appearing  HENT:  Head: Normocephalic and atraumatic.  Dry mucous membranes  Cardiovascular: Regular rhythm.   No murmur heard. Tachycardic  Pulmonary/Chest: Effort normal and breath sounds normal. No respiratory distress.  Abdominal: Soft. There is no tenderness. There is no rebound and no guarding.  Musculoskeletal:  Nonpitting edema to bilateral lower extremities with no tenderness. There is mild erythema to the right leg. Right dorsal hand with significant erythema and swelling at the third MCP joint. Decreased range of motion throughout the digit secondary to swelling.   Neurological: She is alert and oriented to person, place, and time.  Skin: Skin is warm and dry.  Psychiatric: She has a normal mood and affect. Her behavior is normal.  Nursing note and vitals reviewed.    ED Treatments / Results  Labs (all labs ordered are listed, but only abnormal results are displayed) Labs Reviewed  COMPREHENSIVE METABOLIC PANEL  CBC WITH DIFFERENTIAL/PLATELET    EKG  EKG Interpretation None       Radiology No results found.  Procedures Procedures (including critical care time) Apiration of  blood/fluid Performed by: Quintella Reichert Consent obtained. Required items: required blood products, implants, devices, and special equipment available Patient identity confirmed: verbally with patient Time out: Immediately prior to procedure a "time out" was called to verify the correct patient, procedure, equipment, support staff and site/side marked as required. Preparation: Patient was prepped and draped in the usual sterile fashion. Patient tolerance: Patient tolerated the procedure well with no immediate complications.  Location of aspiration: dorsal hand  dorsal hand was prepped with chlorhexidine and infiltrated with 1% lidocaine. An 18-gauge needle was used to express fluid. Fluid was purulent and mixed with blood.   Medications Ordered in ED Medications - No data to display   Initial Impression / Assessment and Plan / ED Course  I have reviewed the triage vital signs and the nursing notes.  Pertinent labs & imaging results that were available during my care of the patient were reviewed by me and considered in my medical decision making (see chart for details).  Clinical Course     Patient here for evaluation of frequent falls with a blacking out spells as well as hand pain. In terms of her hand pain there is significant swelling to her dorsal hand with local fluctuance and surrounding cellulitis. Aspiration demonstrates purulent material. Abscess is over the joint but current clinical picture is not c/w septic joint.  We will start on antibiotics and send cultures. Dr. Lenon Curt with hand surgery was complicated him see the patient in consult. Patient with persistent tachycardia in the emergency department, PE study is positive for acute pulmonary embolism, question if this is contusion to her frequent falls and syncope. Will treat with Lovenox with admission to the hospital service for further hydration and treatment.  Final Clinical Impressions(s) / ED Diagnoses   Final diagnoses:   None    New Prescriptions New Prescriptions   No medications on file  Quintella Reichert, MD 10/09/16 1859

## 2016-10-09 NOTE — Progress Notes (Signed)
Pharmacy Antibiotic Note  [name] is a 66 y.o. female admitted on 10/09/2016 with falls at home, newly diagnosed PE, as well as cellulitis and abscess of right dorsal hand.  Pharmacy has been consulted for cefazolin dosing.  Plan:  Cefazolin 1g IV q8h  Follow up renal fxn, culture results, and clinical course.   Height: 5\' 2"  (157.5 cm) Weight: 144 lb (65.3 kg) IBW/kg (Calculated) : 50.1  Temp (24hrs), Avg:97.6 F (36.4 C), Min:97.6 F (36.4 C), Max:97.6 F (36.4 C)   Recent Labs Lab 10/09/16 1213  WBC 5.0  CREATININE 0.89    Estimated Creatinine Clearance: 55.2 mL/min (by C-G formula based on SCr of 0.89 mg/dL).    Allergies  Allergen Reactions  . Formaldehyde     Other reaction(s): Shortness Of Breath, coughing  . Altace [Ramipril] Cough  . Dilaudid [Hydromorphone] Itching    Drip OK, pills cause itching    Antimicrobials this admission: 10/09/16 Cefazolin >>   Dose adjustments this admission:  Microbiology results: 1/2 I&D culture: collected  Thank you for allowing pharmacy to be a part of this patient's care.  Gretta Arab PharmD, BCPS Pager [phone] 10/09/2016 7:33 PM

## 2016-10-09 NOTE — ED Notes (Signed)
Patient given ginger ale. 

## 2016-10-09 NOTE — ED Notes (Signed)
MD made aware CBG 132

## 2016-10-09 NOTE — Progress Notes (Signed)
   10/09/16 0000  CM Assessment  Expected Discharge [place]  In-house Referral NA  Discharge Planning Services CM Consult  [facility]  Choice offered to / list presented to  Patient  DME Arranged N/A  DME Agency NA  [place]  Status of Service Completed, signed off  Discharge [place]   ED CM noted CM consult for home health  CM spoke with pt who [facility] States she previously had home health before Reports having a walker and cane at home Pt states she refuses to have HHPT if "mike" visits her because "all he did was stand by my chair and talk the whole time"  Pt states no support, denies access to family, friends, and neighbors " I stay in my house and mind my own business. I came up here from [place] in the house my family left me"

## 2016-10-10 ENCOUNTER — Observation Stay (HOSPITAL_BASED_OUTPATIENT_CLINIC_OR_DEPARTMENT_OTHER): Payer: Medicare Other

## 2016-10-10 DIAGNOSIS — D649 Anemia, unspecified: Secondary | ICD-10-CM | POA: Diagnosis not present

## 2016-10-10 DIAGNOSIS — I2699 Other pulmonary embolism without acute cor pulmonale: Secondary | ICD-10-CM

## 2016-10-10 DIAGNOSIS — I10 Essential (primary) hypertension: Secondary | ICD-10-CM | POA: Diagnosis not present

## 2016-10-10 DIAGNOSIS — L02511 Cutaneous abscess of right hand: Secondary | ICD-10-CM | POA: Diagnosis not present

## 2016-10-10 DIAGNOSIS — G4700 Insomnia, unspecified: Secondary | ICD-10-CM | POA: Diagnosis not present

## 2016-10-10 DIAGNOSIS — M797 Fibromyalgia: Secondary | ICD-10-CM | POA: Diagnosis not present

## 2016-10-10 DIAGNOSIS — E11649 Type 2 diabetes mellitus with hypoglycemia without coma: Secondary | ICD-10-CM | POA: Diagnosis not present

## 2016-10-10 DIAGNOSIS — R0602 Shortness of breath: Secondary | ICD-10-CM | POA: Diagnosis not present

## 2016-10-10 DIAGNOSIS — D61818 Other pancytopenia: Secondary | ICD-10-CM | POA: Diagnosis not present

## 2016-10-10 DIAGNOSIS — F329 Major depressive disorder, single episode, unspecified: Secondary | ICD-10-CM | POA: Diagnosis not present

## 2016-10-10 DIAGNOSIS — E039 Hypothyroidism, unspecified: Secondary | ICD-10-CM | POA: Diagnosis not present

## 2016-10-10 DIAGNOSIS — R296 Repeated falls: Secondary | ICD-10-CM | POA: Diagnosis present

## 2016-10-10 DIAGNOSIS — T380X5A Adverse effect of glucocorticoids and synthetic analogues, initial encounter: Secondary | ICD-10-CM | POA: Diagnosis present

## 2016-10-10 DIAGNOSIS — K219 Gastro-esophageal reflux disease without esophagitis: Secondary | ICD-10-CM | POA: Diagnosis not present

## 2016-10-10 DIAGNOSIS — E1165 Type 2 diabetes mellitus with hyperglycemia: Secondary | ICD-10-CM | POA: Diagnosis present

## 2016-10-10 DIAGNOSIS — M170 Bilateral primary osteoarthritis of knee: Secondary | ICD-10-CM | POA: Diagnosis present

## 2016-10-10 DIAGNOSIS — R531 Weakness: Secondary | ICD-10-CM | POA: Diagnosis not present

## 2016-10-10 DIAGNOSIS — E43 Unspecified severe protein-calorie malnutrition: Secondary | ICD-10-CM | POA: Diagnosis not present

## 2016-10-10 DIAGNOSIS — M4316 Spondylolisthesis, lumbar region: Secondary | ICD-10-CM | POA: Diagnosis not present

## 2016-10-10 DIAGNOSIS — Z23 Encounter for immunization: Secondary | ICD-10-CM | POA: Diagnosis not present

## 2016-10-10 DIAGNOSIS — W19XXXD Unspecified fall, subsequent encounter: Secondary | ICD-10-CM | POA: Diagnosis not present

## 2016-10-10 DIAGNOSIS — M62838 Other muscle spasm: Secondary | ICD-10-CM | POA: Diagnosis not present

## 2016-10-10 DIAGNOSIS — G8929 Other chronic pain: Secondary | ICD-10-CM | POA: Diagnosis not present

## 2016-10-10 LAB — CORTISOL-AM, BLOOD: CORTISOL - AM: 6.8 ug/dL (ref 6.7–22.6)

## 2016-10-10 LAB — BASIC METABOLIC PANEL
ANION GAP: 6 (ref 5–15)
BUN: 17 mg/dL (ref 6–20)
CO2: 25 mmol/L (ref 22–32)
Calcium: 7.9 mg/dL — ABNORMAL LOW (ref 8.9–10.3)
Chloride: 108 mmol/L (ref 101–111)
Creatinine, Ser: 0.97 mg/dL (ref 0.44–1.00)
GFR, EST NON AFRICAN AMERICAN: 60 mL/min — AB (ref >60)
GLUCOSE: 86 mg/dL (ref 65–99)
POTASSIUM: 3.6 mmol/L (ref 3.5–5.1)
Sodium: 139 mmol/L (ref 135–145)

## 2016-10-10 LAB — CBC
HEMATOCRIT: 27.3 % — AB (ref 36.0–46.0)
HEMOGLOBIN: 9.1 g/dL — AB (ref 12.0–15.0)
MCH: 30.8 pg (ref 26.0–34.0)
MCHC: 33.3 g/dL (ref 30.0–36.0)
MCV: 92.5 fL (ref 78.0–100.0)
Platelets: 116 10*3/uL — ABNORMAL LOW (ref 150–400)
RBC: 2.95 MIL/uL — AB (ref 3.87–5.11)
RDW: 14.4 % (ref 11.5–15.5)
WBC: 3.5 10*3/uL — ABNORMAL LOW (ref 4.0–10.5)

## 2016-10-10 LAB — ECHOCARDIOGRAM COMPLETE
HEIGHTINCHES: 62 in
WEIGHTICAEL: 2335.11 [oz_av]

## 2016-10-10 LAB — GLUCOSE, CAPILLARY
GLUCOSE-CAPILLARY: 96 mg/dL (ref 65–99)
GLUCOSE-CAPILLARY: 97 mg/dL (ref 65–99)
Glucose-Capillary: 84 mg/dL (ref 65–99)
Glucose-Capillary: 224 mg/dL — ABNORMAL HIGH (ref 65–99)
Glucose-Capillary: 170 mg/dL — ABNORMAL HIGH (ref 65–99)

## 2016-10-10 MED ORDER — INSULIN ASPART 100 UNIT/ML ~~LOC~~ SOLN: 0.0000 [IU] | Freq: Three times a day (TID) | SUBCUTANEOUS | Status: DC

## 2016-10-10 MED ORDER — RIVAROXABAN 20 MG PO TABS: 20.0000 mg | ORAL_TABLET | Freq: Every day | ORAL | Status: DC

## 2016-10-10 MED ORDER — ADULT MULTIVITAMIN W/MINERALS CH
1.0000 | ORAL_TABLET | Freq: Every day | ORAL | Status: DC
  Administered 2016-10-10 – 2016-10-17 (×8): 1 via ORAL
  Filled 2016-10-10 (×8): qty 1

## 2016-10-10 MED ORDER — RIVAROXABAN 15 MG PO TABS
15.0000 mg | ORAL_TABLET | Freq: Two times a day (BID) | ORAL | Status: DC
  Administered 2016-10-10 – 2016-10-11 (×2): 15 mg via ORAL
  Filled 2016-10-10 (×2): qty 1

## 2016-10-10 MED ORDER — INSULIN ASPART 100 UNIT/ML ~~LOC~~ SOLN: 0.0000 [IU] | Freq: Every day | SUBCUTANEOUS | Status: DC

## 2016-10-10 NOTE — Evaluation (Signed)
Physical Therapy Evaluation Patient Details Name: [name] MRN: [id number] DOB: 25-Jun-1951 Today's Date: 10/10/2016   History of Present Illness  [name] is an 67 y.o. right handed female who presents with   Several days of increasing pain and swelling to right dorsal hand. Dr Lenon Curt performed I and D on R hand 10/10/2015.  Pt newly dx PE  Clinical Impression  Pt admitted with above diagnosis. Pt currently with functional limitations due to the deficits listed below (see PT Problem List).  Pt will benefit from skilled PT to increase their independence and safety with mobility to allow discharge to the venue listed below.  Pt feels she does not need PT at home, although she does have a hx of falls     Follow Up Recommendations No PT follow up (pt only wants RN and aide)    Equipment Recommendations  None recommended by PT    Recommendations for Other Services       Precautions / Restrictions Precautions Precautions: Fall      Mobility  Bed Mobility Overal bed mobility: Needs Assistance Bed Mobility: Supine to Sit     Supine to sit: Min assist        Transfers Overall transfer level: Needs assistance Equipment used: Rolling walker (2 wheeled) Transfers: Sit to/from Stand Sit to Stand: Min assist Stand pivot transfers: Min assist       General transfer comment: verbal cues for safety  Ambulation/Gait Ambulation/Gait assistance: Min guard;Supervision Ambulation Distance (Feet): 160 Feet Assistive device: Rolling walker (2 wheeled) Gait Pattern/deviations: Step-through pattern;Decreased stride length     General Gait Details: cues for safety with RW  Stairs            Wheelchair Mobility    Modified Rankin (Stroke Patients Only)       Balance Overall balance assessment: History of Falls                                           Pertinent Vitals/Pain Pain Assessment: Faces Faces Pain Scale: Hurts little more Pain  Location: r hand Pain Descriptors / Indicators: Sore Pain Intervention(s): Monitored during session;Repositioned    Home Living Family/patient expects to be discharged to:: Private residence Living Arrangements: Alone Available Help at Discharge: Family Type of Home: House Home Access: Stairs to enter Entrance Stairs-Rails: Can reach both Entrance Stairs-Number of Steps: 2 very tall Home Layout: One level Home Equipment: Grab bars - tub/shower;Shower seat;Walker - 2 wheels;Bedside commode      Prior Function Level of Independence: Independent               Hand Dominance   Dominant Hand: Right    Extremity/Trunk Assessment   Upper Extremity Assessment Upper Extremity Assessment: Defer to OT evaluation RUE Deficits / Details: R hand painful with movement but is able to feed her self    Lower Extremity Assessment Lower Extremity Assessment: Overall WFL for tasks assessed       Communication   Communication: No difficulties  Cognition Arousal/Alertness: Awake/alert Behavior During Therapy: WFL for tasks assessed/performed Overall Cognitive Status: Within Functional Limits for tasks assessed                      General Comments      Exercises     Assessment/Plan    PT Assessment Patient needs continued PT services  PT Problem List Decreased strength;Decreased activity tolerance;Decreased mobility;Decreased knowledge of use of DME;Decreased safety awareness;Decreased balance          PT Treatment Interventions DME instruction;Functional mobility training;Therapeutic activities;Therapeutic exercise;Gait training    PT Goals (Current goals can be found in the Care Plan section)  Acute Rehab PT Goals Patient Stated Goal: go home to my [place] PT Goal Formulation: With patient Time For Goal Achievement: 10/17/16 Potential to Achieve Goals: Good    Frequency     Barriers to discharge        Co-evaluation               End of Session  Equipment Utilized During Treatment: Gait belt Activity Tolerance: Patient tolerated treatment well Patient left: with call bell/phone within reach Nurse Communication: Mobility status    Functional Assessment Tool Used: clincal judgement Functional Limitation: Mobility: Walking and moving around Mobility: Walking and Moving Around Current Status [id number]): At least 1 percent but less than 20 percent impaired, limited or restricted Mobility: Walking and Moving Around Goal Status [phone]): At least 1 percent but less than 20 percent impaired, limited or restricted    Time: 1020-1046 PT Time Calculation (min) (ACUTE ONLY): 26 min   Charges:     PT Treatments $Gait Training: 8-22 mins   PT G Codes:   PT G-Codes **NOT FOR INPATIENT CLASS** Functional Assessment Tool Used: clincal judgement Functional Limitation: Mobility: Walking and moving around Mobility: Walking and Moving Around Current Status [id number]): At least 1 percent but less than 20 percent impaired, limited or restricted Mobility: Walking and Moving Around Goal Status [phone]): At least 1 percent but less than 20 percent impaired, limited or restricted    [facility] 10/10/2016, 1:30 PM

## 2016-10-10 NOTE — Care Management Obs Status (Signed)
[place] NOTIFICATION   Patient Details  Name: FARREL SISEMORE MRN: [id number] Date of Birth: Feb 26, 1951   Medicare Observation Status Notification Given:  Yes    MahabirJuliann Pulse, RN 10/10/2016, 1:24 PM

## 2016-10-10 NOTE — Progress Notes (Signed)
Initial Nutrition Assessment  DOCUMENTATION CODES:   Severe malnutrition in context of chronic illness  INTERVENTION:  - Diet liberalization per MD discretion, MD text paged. - Continue to encourage PO intakes. - RD will continue to monitor for nutrition-related needs, including need for oral nutrition supplements, at follow-up.   NUTRITION DIAGNOSIS:   Inadequate oral intake related to poor appetite, chronic illness as evidenced by per patient/family report.  GOAL:   Patient will meet greater than or equal to 90% of their needs  MONITOR:   PO intake, Weight trends, Labs, Skin, I & O's  REASON FOR ASSESSMENT:   Malnutrition Screening Tool  ASSESSMENT:   66 y.o. female with a history of anemia, chronic back pain, fibromyalgia, s/p multiple orthopedic surgeries, depression, GERD, HTN, T2DM, hypothyroidism, and lichen planus who presented to the ED for frequent falls and shortness of breath. She reports frequent falls without preceding symptoms, described as just due to sudden weakness in her legs. She was admitted in July 2017 for this and discharged to SNF for rehabilitation. She has chronic pain due to osteoarthritis in her knees and back. Her shortness of breath has worsened over the past 2 - 3 days. ED work up showed right-sided PE's on CT. She also reported right hand pain worsening for 2 months, becoming red, swollen, and painful. Abscess was noted on exam and I&D'ed by EDP. Antibiotics were started and hand surgery was consulted.   Pt seen for MST. BMI indicates overweight status. No intakes documented but pt reports she ate 1/2 baked potato for lunch. Unable to obtain much nutrition-related information during this visit as pt was interested in talking about other topics as well. She does report that she was at [facility] over the summer and did not like the food there so she often did not eat. She states d/t this she lost a significant amount of weight and that appetite has not  fully returned since returning home from facility. She reports she typically only eats 1 meal/day. Pt states that HgbA1 is checked biannually and often after Christmas and after Easter; pt reports that around these holidays she consumes large amounts of candy and other dessert-like items. At home she does not follow any type of diet. She is insistent on diet liberalization during hospitalization and states that she does not eat fish or chicken.   Unable to perform physical assessment d/t pt wishes. Per chart review, she has lost 11 lbs (7% body weight) in the past 3 months and 45 lbs (23% body weight) in the past 5 months which is significant for time frame. Pt meets criteria for severe malnutrition based on weight loss and assumed <75% needed energy intake for >/= 1 month based on pt report.   Medications reviewed; sliding scale Novolog, 25 mcg oral Synthroid/day, 80 mg oral Protonix/day. Labs reviewed; CBGs: 84, 96, and 224 mg/dL today, Ca: 7.9 mg/dL.    Diet Order:  Diet Heart Room service appropriate? Yes; Fluid consistency: Thin  Skin:  Wound (see comment) (R hand incision from I&D on 10/09/16)  Last BM:  1/2  Height:   Ht Readings from Last 1 Encounters:  10/09/16 5\' 2"  (1.575 m)    Weight:   Wt Readings from Last 1 Encounters:  10/09/16 145 lb 15.1 oz (66.2 kg)    Ideal Body Weight:  50 kg  BMI:  Body mass index is 26.69 kg/m.  Estimated Nutritional Needs:   Kcal:  [id number] (22-25 kcal/kg)  Protein:  55-65 grams  Fluid:  1.8 L/day  EDUCATION NEEDS:   No education needs identified at this time    Jarome Matin, MS, RD, LDN, [facility] Inpatient Clinical Dietitian Pager # [phone] After hours/weekend pager # [phone]

## 2016-10-10 NOTE — Progress Notes (Signed)
PROGRESS NOTE    [name]  [id number] DOB: 1951/01/02 DOA: 10/09/2016 PCP: Joya Gaskins, MD    Brief Narrative:  66 y.o. female with a history of anemia, chronic back pain, fibromyalgia, s/p multiple orthopedic surgeries, depression, GERD, HTN, T2DM, hypothyroidism, and lichen planus who presented to the ED for frequent falls and shortness of breath.   She reports frequent falls without preceding symptoms, described as just due to sudden weakness in her legs. She was admitted in July 2017 for this and discharged to SNF for rehabilitation. She denies LOC, but has a hard time remembering the details of these events.   She has chronic pain due to osteoarthritis in her knees and back for which she takes oxycodone and ibuprofen. Her shortness of breath has worsened over the past 2 - 3 days, is intermittent, with exertion only and is not associated with chest pain or palpitations.   On arrival she was afebrile, 100% on room air and tachycardic at 122bpm. BP 165/76. Orthostatics were attempted but she was unable to stand. ED work up showed right-sided PE's on CT. She also reported right hand pain worsening for 2 months, becoming red, swollen and painful following labwork drawn from right hand dorsum. Abscess was noted on exam and I&D'ed by EDP. Antibiotics were started and hand surgery was consulted  Assessment & Plan:   Active Problems:   Spondylolisthesis of lumbar region   Hypothyroidism   Hypertension   Anemia   GERD (gastroesophageal reflux disease)   Weakness   Fall   Pulmonary embolism ([place])   Pulmonary Emboli:  - Confirmed on CT Chest without radiographic evidence of right heart strain. - Transition to PO xarelto today.  - No LE DVT on dopplers, reviewed - 2d echo reviewed. Normal LVEF, unremarkable study  Weakness/falls:  -Seen by Dr. Krista Blue. Was at SNF following last admission.  -Unable to obtain orthostatics in ED. CT head negative. - Up with assistance -  PT/OT - Outpatient records reviewed. Per Dr. Krista Blue, EMG nerve conduction study in 3/17 demonstrated mild chronic L lumbar radiculopathy mainly involving left L4-5 myotomes. Patient was prescribed trileptal 150mg  qhs for muscle cramping by Dr. Krista Blue  Right hand abscess: s/p I&D in ED - Empiric antibiotics - Follow wound culture - Hand consulted by EDP with subsequent I/D done  Hypoglycemia: CBG 44 in ED improved with po. ?adrenal insufficiency in setting of chronic steroid use for ?lichen planus per dermatology.  - AM cortisol within normal limits  Chronic low back pain/fibromyalgia/lumbar spondylolisthesis: - Continue gabapentin 600 mg by mouth 3 times a day. - Continue Flexeril 10 mg by mouth 3 times a day as needed. - Continue oxycodone 20 mg by mouth 3 times a day as needed - Stable at present. Per above.  Hypothyroidism: TSH wnl July 2017 - Continue levothyroxine 25 g daily..  Hypertension: Chronic, stable - Continue home medications. No AKI. - BP stable at present  Type 2 diabetes mellitus:  - Suspect steroid-induced as she's not been on steroids lately and last HbA1c 5.5% in Aug 2017.  - Cont ssi coverage  Anemia: Chronic, stable. Hgb 11.2, normocytic indices on admission. - stable. - Repeat CBC in AM  GERD:  - Chronic, stable. - Continue PPI  DVT prophylaxis: lovenox subq Code Status: full Family Communication: Pt in room Disposition Plan: Uncertain at this time  Consultants:     Procedures:     Antimicrobials: Anti-infectives    Start     Dose/Rate Route Frequency Ordered  Stop   10/10/16 0000  ceFAZolin (ANCEF) IVPB 1 g/50 mL premix     1 g 100 mL/hr over 30 Minutes Intravenous Every 8 hours 10/09/16 1938     10/09/16 1630  ceFAZolin (ANCEF) IVPB 1 g/50 mL premix     1 g 100 mL/hr over 30 Minutes Intravenous  Once 10/09/16 1618 10/09/16 1758       Subjective: No complaints at this time  Objective: Vitals:   10/09/16 2000 10/09/16 2043  10/10/16 0603 10/10/16 1432  BP: (!) 134/53 (!) 148/64 (!) 116/38 (!) 144/89  Pulse: (!) 121 (!) 110 95 (!) 108  Resp: 17 18 18 18   Temp:  98 F (36.7 C) 97.9 F (36.6 C) 97.7 F (36.5 C)  TempSrc:  Oral Oral Oral  SpO2: 99% 98% 99% 100%  Weight:  66.2 kg (145 lb 15.1 oz)    Height:  5\' 2"  (1.575 m)      Intake/Output Summary (Last 24 hours) at 10/10/16 1803 Last data filed at 10/10/16 1425  Gross per 24 hour  Intake              650 ml  Output              200 ml  Net              450 ml   Filed Weights   10/09/16 1105 10/09/16 2043  Weight: 65.3 kg (144 lb) 66.2 kg (145 lb 15.1 oz)    Examination:  General exam: Appears calm and comfortable  Respiratory system: Clear to auscultation. Respiratory effort normal. Cardiovascular system: S1 & S2 heard, RRR. Gastrointestinal system: Abdomen is nondistended, soft and nontender. No organomegaly or masses felt. Normal bowel sounds heard. Central nervous system: Alert and oriented. No focal neurological deficits. Extremities: Symmetric 5 x 5 power. Skin: No rashes, lesions Psychiatry: Judgement and insight appear normal. Mood & affect appropriate.   Data Reviewed: I have personally reviewed following labs and imaging studies  CBC:  Recent Labs Lab 10/09/16 1213 10/10/16 0559  WBC 5.0 3.5*  NEUTROABS 4.0  --   HGB 11.2* 9.1*  HCT 33.3* 27.3*  MCV 93.0 92.5  PLT 138* [id number]*   Basic Metabolic Panel:  Recent Labs Lab 10/09/16 1213 10/10/16 0559  NA 138 139  K 4.2 3.6  CL 106 108  CO2 19* 25  GLUCOSE 55* 86  BUN 20 17  CREATININE 0.89 0.97  CALCIUM 8.5* 7.9*   GFR: Estimated Creatinine Clearance: 50.9 mL/min (by C-G formula based on SCr of 0.97 mg/dL). Liver Function Tests:  Recent Labs Lab 10/09/16 1213  AST 34  ALT 32  ALKPHOS 89  BILITOT 1.7*  PROT 5.3*  ALBUMIN 2.5*   No results for input(s): LIPASE, AMYLASE in the last 168 hours. No results for input(s): AMMONIA in the last 168  hours. Coagulation Profile: No results for input(s): INR, PROTIME in the last 168 hours. Cardiac Enzymes: No results for input(s): CKTOTAL, CKMB, CKMBINDEX, TROPONINI in the last 168 hours. BNP (last 3 results) No results for input(s): PROBNP in the last 8760 hours. HbA1C: No results for input(s): HGBA1C in the last 72 hours. CBG:  Recent Labs Lab 10/09/16 1831 10/10/16 0010 10/10/16 0601 10/10/16 1155 10/10/16 1727  GLUCAP 71 96 84 224* 97   Lipid Profile: No results for input(s): CHOL, HDL, LDLCALC, TRIG, CHOLHDL, LDLDIRECT in the last 72 hours. Thyroid Function Tests: No results for input(s): TSH, T4TOTAL, FREET4, T3FREE, THYROIDAB in the last  72 hours. Anemia Panel: No results for input(s): VITAMINB12, FOLATE, FERRITIN, TIBC, IRON, RETICCTPCT in the last 72 hours. Sepsis Labs: No results for input(s): PROCALCITON, LATICACIDVEN in the last 168 hours.  Recent Results (from the past 240 hour(s))  Aerobic Culture (superficial specimen)     Status: None (Preliminary result)   Collection Time: 10/09/16  4:09 PM  Result Value Ref Range Status   Specimen Description ABSCESS  Final   Special Requests NONE  Final   Gram Stain PENDING  Incomplete   Culture   Final    CULTURE REINCUBATED FOR BETTER GROWTH Performed at [facility]    Report Status PENDING  Incomplete     Radiology Studies: Dg Chest 2 View  Result Date: 10/09/2016 CLINICAL DATA:  Tachycardia EXAM: CHEST  2 VIEW COMPARISON:  05/03/2016 FINDINGS: Normal heart size and mediastinal contours. There is no edema, consolidation, effusion, or pneumothorax. Remote anterior left fifth and sixth rib fractures. IMPRESSION: No active cardiopulmonary disease. Electronically Signed   By: Monte Fantasia M.D.   On: 10/09/2016 12:29   Ct Head Wo Contrast  Result Date: 10/09/2016 CLINICAL DATA:  Per EMS, pt from home, reports fell x 2 this am, the second time pt is with [facility], does not recall the previous fall and does not  remember them coming to check on her when she fell the first time. pt. C/o headaches EXAM: CT HEAD WITHOUT CONTRAST TECHNIQUE: Contiguous axial images were obtained from the base of the skull through the vertex without intravenous contrast. COMPARISON:  CT head dated 05/03/2016. FINDINGS: Brain: All areas of the brain demonstrate normal gray-white matter attenuation. There is no mass, hemorrhage, edema or other evidence of acute parenchymal abnormality. No extra-axial hemorrhage. Ventricles are within normal limits in size and configuration. Vascular: There are chronic calcified atherosclerotic changes of the large vessels at the skull base. No unexpected hyperdense vessel. Skull: Normal. Negative for fracture or focal lesion. Sinuses/Orbits: No acute finding. Other: None. IMPRESSION: No acute findings.  No intracranial mass, hemorrhage or edema. Electronically Signed   By: Franki Cabot M.D.   On: 10/09/2016 14:25   Ct Angio Chest Pe W/cm &/or Wo Cm  Result Date: 10/09/2016 CLINICAL DATA:  Syncope with intermittent shortness of breath EXAM: CT ANGIOGRAPHY CHEST WITH CONTRAST TECHNIQUE: Multidetector CT imaging of the chest was performed using the standard protocol during bolus administration of intravenous contrast. Multiplanar CT image reconstructions and MIPs were obtained to evaluate the vascular anatomy. CONTRAST:  70 mL Isovue 370 nonionic note pulmonary emboli on the left are noted. COMPARISON:  Chest radiograph October 09, 2016 FINDINGS: Cardiovascular: There are pulmonary emboli arising from the distal most aspect of the right main pulmonary artery extending into several right lower lobe pulmonary artery branches. Eighty pulmonary embolus is also noted in the proximal aspect of the posterior segment right upper lobe pulmonary artery. The right ventricle to left ventricle diameter ratio is less than 0.9, not indicative of right heart strain. There is no demonstrable thoracic aortic aneurysm or dissection.  The visualized great vessels appear unremarkable. Note that the right and left common carotid arteries arise as a common trunk, an anatomic variant. There is atherosclerotic calcification in the aorta as well as in several regions of coronary artery. Pericardium is not thickened. Mediastinum/Nodes: Thyroid appears unremarkable. There are subcentimeter mediastinal lymph nodes but no adenopathy evident by size criteria. There is a focal hiatal hernia. Lungs/Pleura: On axial slice 49 series 7, there is a 3 mm nodular  opacity in the anterior segment of the right upper lobe. On axial slice 71 series 7, there is a 4 mm nodular opacity in the anterior segment of the left lower lobe. On axial slice 56 series 4, there is a 5 x 2 mm nodular opacity abutting the pleura in the medial segment of the right middle lobe. There is no appreciable edema or consolidation. There is mild left base atelectatic change. There is no pleural effusion or pleural thickening. Upper Abdomen: There is hepatic steatosis. There is cholelithiasis. There is atherosclerotic calcification in the upper abdominal aorta and major branch vessels. Musculoskeletal: There are no blastic or lytic bone lesions. Review of the MIP images confirms the above findings. IMPRESSION: Pulmonary emboli in several proximal right upper lobe pulmonary artery branches as well as in the posterior segment right upper lobe pulmonary artery branch. Findings do not meet criteria for right heart strain. Multifocal atherosclerosis including foci of coronary artery calcification. Small nodular opacities, largest measuring 4 mm. No follow-up needed if patient is low-risk (and has no known or suspected primary neoplasm). Non-contrast chest CT can be considered in 12 months if patient is high-risk. This recommendation follows the consensus statement: Guidelines for Management of Incidental Pulmonary Nodules Detected on CT Images: From the Fleischner Society 2017; Radiology 2017;  284:228-243. Hiatal hernia present.  Cholelithiasis.  Hepatic steatosis. No evident adenopathy. Critical Value/emergent results were called by telephone at the time of interpretation on 10/09/2016 at 3:30 pm to Dr. Quintella Reichert , who verbally acknowledged these results. Electronically Signed   By: Lowella Grip III M.D.   On: 10/09/2016 15:31   Dg Hand Complete Right  Result Date: 10/09/2016 CLINICAL DATA:  66 year old female with history of blood draw in early November 2017 with hematoma over the second third metacarpal phalangeal joints. Recent pain in this area over the past 2-3 days. EXAM: RIGHT HAND - COMPLETE 3+ VIEW COMPARISON:  No priors. FINDINGS: Three views of the right hand demonstrate extensive soft tissue swelling dorsal to the MCP joints. No retained radiopaque foreign body in the underlying soft tissues. No acute displaced fracture, subluxation or dislocation. No aggressive appearing lytic or blastic osseous lesions. IMPRESSION: 1. Extensive soft tissue swelling dorsal to the MCP joints without evidence of underlying acute bony abnormality. No retained radiopaque foreign body. Electronically Signed   By: Vinnie Langton M.D.   On: 10/09/2016 12:22    Scheduled Meds: .  ceFAZolin (ANCEF) IV  1 g Intravenous Q8H  . gabapentin  300 mg Oral QHS  . insulin aspart  0-15 Units Subcutaneous TID WC  . insulin aspart  0-5 Units Subcutaneous QHS  . levothyroxine  25 mcg Oral QAC breakfast  . meloxicam  7.5 mg Oral Daily  . multivitamin with minerals  1 tablet Oral Daily  . pantoprazole  80 mg Oral Daily  . pneumococcal 23 valent vaccine  0.5 mL Intramuscular Tomorrow-1000  . rivaroxaban  15 mg Oral BID WC   Followed by  . [START ON 10/31/2016] rivaroxaban  20 mg Oral Q supper  . sodium chloride flush  3 mL Intravenous Q12H   Continuous Infusions:   LOS: 0 days   Tra Wilemon, Orpah Melter, MD [facility] Pager [phone]  If 7PM-7AM, please contact  night-coverage [email or web address] Password [facility] 10/10/2016, 6:03 PM

## 2016-10-10 NOTE — Progress Notes (Signed)
  Echocardiogram 2D Echocardiogram has been performed.  [name] 10/10/2016, 12:58 PM

## 2016-10-10 NOTE — Discharge Instructions (Addendum)
Pulmonary Embolism °A pulmonary embolism (PE) is a sudden blockage or decrease of blood flow in one lung or both lungs. Most blockages come from a blood clot that travels from the legs or the pelvis to the lungs. PE is a dangerous and potentially life-threatening condition if it is not treated right away. °What are the causes? °A pulmonary embolism occurs most commonly when a blood clot travels from one of your veins to your lungs. Rarely, PE is caused by air, fat, amniotic fluid, or part of a tumor traveling through your veins to your lungs. °What increases the risk? °A PE is more likely to develop in: °· People who smoke. °· People who are older, especially over 60 years of age. °· People who are overweight (obese). °· People who sit or lie still for a long time, such as during long-distance travel (over 4 hours), bed rest, hospitalization, or during recovery from certain medical conditions like a stroke. °· People who do not engage in much physical activity (sedentary lifestyle). °· People who have chronic breathing disorders. °· People who have a personal or family history of blood clots or blood clotting disease. °· People who have peripheral vascular disease (PVD), diabetes, or some types of cancer. °· People who have heart disease, especially if the person had a recent heart attack or has congestive heart failure. °· People who have neurological diseases that affect the legs (leg paresis). °· People who have had a traumatic injury, such as breaking a hip or leg. °· People who have recently had major or lengthy surgery, especially on the hip, knee, or abdomen. °· People who have had a central line placed inside a large vein. °· People who take medicines that contain the hormone estrogen. These include birth control pills and hormone replacement therapy. °· Pregnancy or during childbirth or the postpartum period. ° °What are the signs or symptoms? °The symptoms of a PE usually start suddenly and  include: °· Shortness of breath while active or at rest. °· Coughing or coughing up blood or blood-tinged mucus. °· Chest pain that is often worse with deep breaths. °· Rapid or irregular heartbeat. °· Feeling light-headed or dizzy. °· Fainting. °· Feeling anxious. °· Sweating. ° °There may also be pain and swelling in a leg if that is where the blood clot started. °These symptoms may represent a serious problem that is an emergency. Do not wait to see if the symptoms will go away. Get medical help right away. Call your local emergency services (911 in the [place] Do not drive yourself to the hospital. °How is this diagnosed? °Your health care provider will take a medical history and perform a physical exam. You may also have other tests, including: °· Blood tests to assess the clotting properties of your blood, assess oxygen levels in your blood, and find blood clots. °· Imaging tests, such as CT, ultrasound, MRI, X-ray, and other tests to see if you have clots anywhere in your body. °· An electrocardiogram (ECG) to look for heart strain from blood clots in the lungs. ° °How is this treated? °The main goals of PE treatment are: °· To stop a blood clot from growing larger. °· To stop new blood clots from forming. ° °The type of treatment that you receive depends on many factors, such as the cause of your PE, your risk for bleeding or developing more clots, and other medical conditions that you have. Sometimes, a combination of treatments is necessary. °This condition may be treated with: °· Medicines, including newer oral blood thinners (  anticoagulants), warfarin, low molecular weight heparins, thrombolytics, or heparins. °· Wearing compression stockings or using different types of devices. °· Surgery (rare) to remove the blood clot or to place a filter in your abdomen to stop the blood clot from traveling to your lungs. ° °Treatments for a PE are often divided into immediate treatment, long-term treatment (up to 3  months after PE), and extended treatment (more than 3 months after PE). Your treatment may continue for several months. This is called maintenance therapy, and it is used to prevent the forming of new blood clots. You can work with your health care provider to choose the treatment program that is best for you. °What are anticoagulants? °Anticoagulants are medicines that treat PEs. They can stop current blood clots from growing and stop new clots from forming. They cannot dissolve existing clots. Your body dissolves clots by itself over time. Anticoagulants are given by mouth, by injection, or through an IV tube. °What are thrombolytics? °Thrombolytics are clot-dissolving medicines that are used to dissolve a PE. They carry a high risk of bleeding, so they tend to be used only in severe cases or if you have very low blood pressure. °Follow these instructions at home: °If you are taking a newer oral anticoagulant: °· Take the medicine every single day at the same time each day. °· Understand what foods and drugs interact with this medicine. °· Understand that there are no regular blood tests required when using this medicine. °· Understand the side effects of this medicine, including excessive bruising or bleeding. Ask your health care provider or pharmacist about other possible side effects. °If you are taking warfarin: °· Understand how to take warfarin and know which foods can affect how warfarin works in your body. °· Understand that it is dangerous to take too much or too little warfarin. Too much warfarin increases the risk of bleeding. Too little warfarin continues to allow the risk for blood clots. °· Follow your PT and INR blood testing schedule. The PT and INR results allow your health care provider to adjust your dose of warfarin. It is very important that you have your PT and INR tested as often as told by your health care provider. °· Avoid major changes in your diet, or tell your health care provider  before you change your diet. Arrange a visit with a registered dietitian to answer your questions. Many foods, especially foods that are high in vitamin K, can interfere with warfarin and affect the PT and INR results. Eat a consistent amount of foods that are high in vitamin K, such as: °? Spinach, kale, broccoli, cabbage, collard greens, turnip greens, Brussels sprouts, peas, cauliflower, seaweed, and parsley. °? Beef liver and pork liver. °? Green tea. °? Soybean oil. °· Tell your health care provider about any and all medicines, vitamins, and supplements that you take, including aspirin and other over-the-counter anti-inflammatory medicines. Be especially cautious with aspirin and anti-inflammatory medicines. Do not take those before you ask your health care provider if it is safe to do so. This is important because many medicines can interfere with warfarin and affect the PT and INR results. °· Do not start or stop taking any over-the-counter or prescription medicine unless your health care provider or pharmacist tells you to do so. °If you take warfarin, you will also need to do these things: °· Hold pressure over cuts for longer than usual. °· Tell your dentist and other health care providers that you are taking warfarin before you have   any procedures in which bleeding may occur. °· Avoid alcohol or drink very small amounts. Tell your health care provider if you change your alcohol intake. °· Do not use tobacco products, including cigarettes, chewing tobacco, and e-cigarettes. If you need help quitting, ask your health care provider. °· Avoid contact sports. ° °General instructions °· Take over-the-counter and prescription medicines only as told by your health care provider. Anticoagulant medicines can have side effects, including easy bruising and difficulty stopping bleeding. If you are prescribed an anticoagulant, you will also need to do these things: °? Hold pressure over cuts for longer than  usual. °? Tell your dentist and other health care providers that you are taking anticoagulants before you have any procedures in which bleeding may occur. °? Avoid contact sports. °· Wear a medical alert bracelet or carry a medical alert card that says you have had a PE. °· Ask your health care provider how soon you can go back to your normal activities. Stay active to prevent new blood clots from forming. °· Make sure to exercise while traveling or when you have been sitting or standing for a long period of time. It is very important to exercise. Exercise your legs by walking or by tightening and relaxing your leg muscles often. Take frequent walks. °· Wear compression stockings as told by your health care provider to help prevent more blood clots from forming. °· Do not use tobacco products, including cigarettes, chewing tobacco, and e-cigarettes. If you need help quitting, ask your health care provider. °· Keep all follow-up appointments with your health care provider. This is important. °How is this prevented? °Take these actions to decrease your risk of developing another PE: °· Exercise regularly. For at least 30 minutes every day, engage in: °? Activity that involves moving your arms and legs. °? Activity that encourages good blood flow through your body by increasing your heart rate. °· Exercise your arms and legs every hour during long-distance travel (over 4 hours). Drink plenty of water and avoid drinking alcohol while traveling. °· Avoid sitting or lying in bed for long periods of time without moving your legs. °· Maintain a weight that is appropriate for your height. Ask your health care provider what weight is healthy for you. °· If you are a woman who is over 35 years of age, avoid unnecessary use of medicines that contain estrogen. These include birth control pills. °· Do not smoke, especially if you take estrogen medicines. If you need help quitting, ask your health care provider. °· If you are at  very high risk for PE, wear compression stockings. °· If you recently had a PE, have regularly scheduled ultrasound testing on your legs to check for new blood clots. ° °If you are hospitalized, prevention measures may include: °· Early walking after surgery, as soon as your health care provider says that it is safe. °· Receiving anticoagulants to prevent blood clots. If you cannot take anticoagulants, other options may be available, such as wearing compression stockings or using different types of devices. ° °Get help right away if: °· You have new or increased pain, swelling, or redness in an arm or leg. °· You have numbness or tingling in an arm or leg. °· You have shortness of breath while active or at rest. °· You have chest pain. °· You have a rapid or irregular heartbeat. °· You feel light-headed or dizzy. °· You cough up blood. °· You notice blood in your vomit, bowel movement, or   urine. °· You have a fever. °These symptoms may represent a serious problem that is an emergency. Do not wait to see if the symptoms will go away. Get medical help right away. Call your local emergency services (911 in the [place] Do not drive yourself to the hospital. °This information is not intended to replace advice given to you by your health care provider. Make sure you discuss any questions you have with your health care provider. °Document Released: 09/21/2000 Document Revised: 03/01/2016 Document Reviewed: 01/19/2015 °Elsevier Interactive Patient Education © 2017 [employer] ° °

## 2016-10-10 NOTE — Evaluation (Signed)
Occupational Therapy Evaluation Patient Details Name: [name] MRN: [id number] DOB: 09-Aug-1951 Today's Date: 10/10/2016    History of Present Illness [name] is an 66 y.o. right handed female who presents with   Several days of increasing pain and swelling to right dorsal hand. Dr Lenon Curt performed I and D on R hand 10/10/2015.  Pt newly dx PE   Clinical Impression   Pt admitted with PE and R hand pain. Pt currently with functional limitations due to the deficits listed below (see OT Problem List).  Pt will benefit from skilled OT to increase their safety and independence with ADL and functional mobility for ADL to facilitate discharge to venue listed below.     Follow Up Recommendations  Home health OT;Supervision/Assistance - 24 hour    Equipment Recommendations  None recommended by OT       Precautions / Restrictions Precautions Precautions: Fall Restrictions Weight Bearing Restrictions: No      Mobility Bed Mobility Overal bed mobility: Needs Assistance Bed Mobility: Supine to Sit     Supine to sit: Min assist        Transfers Overall transfer level: Needs assistance Equipment used: Rolling walker (2 wheeled) Transfers: Sit to/from [facility] Sit to Stand: Min assist Stand pivot transfers: Min assist       General transfer comment: VC for safety    Balance Overall balance assessment: History of Falls                                          ADL Overall ADL's : Needs assistance/impaired Eating/Feeding: Set up;Sitting   Grooming: Set up;Sitting   Upper Body Bathing: Set up;Sitting   Lower Body Bathing: Moderate assistance;Sit to/from stand;Cueing for safety;Cueing for sequencing   Upper Body Dressing : Set up;Sitting   Lower Body Dressing: Moderate assistance;Sit to/from stand;Cueing for safety;Cueing for sequencing   Toilet Transfer: Minimal assistance;Cueing for sequencing;Cueing for safety    Toileting- Clothing Manipulation and Hygiene: Minimal assistance;Sit to/from stand;Cueing for safety;Cueing for sequencing                         Pertinent Vitals/Pain Pain Assessment: Faces Faces Pain Scale: Hurts little more Pain Location: r hand Pain Descriptors / Indicators: Sore Pain Intervention(s): Monitored during session;Repositioned     Hand Dominance Right   Extremity/Trunk Assessment Upper Extremity Assessment Upper Extremity Assessment: Overall WFL for tasks assessed;RUE deficits/detail RUE Deficits / Details: R hand painful with movement but is able to feed her self           Communication Communication Communication: No difficulties   Cognition Arousal/Alertness: Awake/alert Behavior During Therapy: WFL for tasks assessed/performed Overall Cognitive Status: Within Functional Limits for tasks assessed                                Home Living Family/patient expects to be discharged to:: Private residence Living Arrangements: Alone Available Help at Discharge: Family Type of Home: House Home Access: Stairs to enter [employer] of Steps: 2 very tall Entrance Stairs-Rails: Can reach both Home Layout: One level     Bathroom Shower/Tub: Occupational psychologist: Standard     Home Equipment: Grab bars - tub/shower;Shower seat;Walker - 2 wheels;Bedside commode  Prior Functioning/Environment Level of Independence: Independent                 OT Problem List: Decreased strength;Decreased activity tolerance;Decreased knowledge of use of DME or AE   OT Treatment/Interventions: Self-care/ADL training;DME and/or AE instruction;Patient/family education    OT Goals(Current goals can be found in the care plan section) Acute Rehab OT Goals Patient Stated Goal: go home to my [place] OT Goal Formulation: With patient Time For Goal Achievement: 10/24/16 Potential to Achieve Goals: Good  OT Frequency:  Min 2X/week   Barriers to D/C:               End of Session Nurse Communication: Mobility status  Activity Tolerance: Patient tolerated treatment well Patient left: in chair;with call bell/phone within reach;with nursing/sitter in room   Time: 0952-1016 OT Time Calculation (min): 24 min Charges:  OT General Charges $OT Visit: 1 Procedure OT Evaluation $OT Eval Moderate Complexity: 1 Procedure OT Treatments $Self Care/Home Management : 8-22 mins G-Codes:    Payton Mccallum D October 21, 2016, 11:55 AM

## 2016-10-10 NOTE — Progress Notes (Signed)
*  PRELIMINARY RESULTS* Vascular Ultrasound Bilateral lower extremity venous duplex has been completed.  Preliminary findings: No evidence of deep vein thrombosis in the visualized veins of the lower extremities.  Negative for baker's cysts bilaterally.   Myrtie Cruise Zetha Kuhar 10/10/2016, 9:15 AM

## 2016-10-10 NOTE — Care Management Note (Signed)
Case Management Note  Patient Details  Name: [name] MRN: [id number] Date of Birth: 24-Aug-1951  Subjective/Objective:65 y/o f admitted w/PE, L hand would. From home. Used [facility] in the past but may use another HHC agency-I have informed [facility] rep to discuss patient's concerns per patient request. Provided patient w/HHC provider list as resource.  Await PT  recc.                    Action/Plan:d/c plan home.   Expected Discharge Date:   (unknown)               Expected Discharge Plan:  [place]  In-House Referral:  NA  Discharge planning Services  CM Consult  Post Acute Care Choice:  Home Health Choice offered to:  Patient  DME Arranged:  N/A DME Agency:  NA  HH Arranged:    [place] Agency:     Status of Service:  In process, will continue to follow  If discussed at Long Length of Stay Meetings, dates discussed:    Additional Comments:  Dessa Phi, RN 10/10/2016, 1:24 PM

## 2016-10-10 NOTE — Progress Notes (Signed)
PHARMACY NOTE -  ancef  Pharmacy has been assisting with dosing of ancef  for cellulitis. Dosage remains stable at 1gm q8h and need for further dosage adjustment appears unlikely at present.    Will sign off at this time.  Please reconsult if a change in clinical status warrants re-evaluation of dosage.  Dia Sitter, PharmD, BCPS 10/10/2016 9:47 AM

## 2016-10-11 ENCOUNTER — Encounter (HOSPITAL_COMMUNITY): Payer: Self-pay

## 2016-10-11 ENCOUNTER — Ambulatory Visit: Payer: Medicare Other | Admitting: Neurology

## 2016-10-11 LAB — CBC
HCT: 27.9 % — ABNORMAL LOW (ref 36.0–46.0)
HEMOGLOBIN: 9.3 g/dL — AB (ref 12.0–15.0)
MCH: 30.8 pg (ref 26.0–34.0)
MCHC: 33.3 g/dL (ref 30.0–36.0)
MCV: 92.4 fL (ref 78.0–100.0)
Platelets: 108 10*3/uL — ABNORMAL LOW (ref 150–400)
RBC: 3.02 MIL/uL — AB (ref 3.87–5.11)
RDW: 14.4 % (ref 11.5–15.5)
WBC: 3.8 10*3/uL — ABNORMAL LOW (ref 4.0–10.5)

## 2016-10-11 LAB — GLUCOSE, CAPILLARY
GLUCOSE-CAPILLARY: 82 mg/dL (ref 65–99)
GLUCOSE-CAPILLARY: 110 mg/dL — AB (ref 65–99)
Glucose-Capillary: 130 mg/dL — ABNORMAL HIGH (ref 65–99)
Glucose-Capillary: 114 mg/dL — ABNORMAL HIGH (ref 65–99)

## 2016-10-11 LAB — PROTIME-INR
INR: 1.57
Prothrombin Time: 18.9 seconds — ABNORMAL HIGH (ref 11.4–15.2)

## 2016-10-11 MED ORDER — WARFARIN VIDEO: Freq: Once | Status: DC

## 2016-10-11 MED ORDER — COUMADIN BOOK
Freq: Once | Status: AC
  Administered 2016-10-11: 12:00:00
  Filled 2016-10-11: qty 1

## 2016-10-11 MED ORDER — ENOXAPARIN SODIUM 80 MG/0.8ML ~~LOC~~ SOLN
65.0000 mg | Freq: Two times a day (BID) | SUBCUTANEOUS | Status: AC
  Administered 2016-10-11 – 2016-10-13 (×5): 65 mg via SUBCUTANEOUS
  Filled 2016-10-11 (×5): qty 0.8

## 2016-10-11 MED ORDER — WARFARIN SODIUM 4 MG PO TABS
4.0000 mg | ORAL_TABLET | Freq: Once | ORAL | Status: AC
  Administered 2016-10-11: 4 mg via ORAL
  Filled 2016-10-11: qty 1

## 2016-10-11 MED ORDER — VANCOMYCIN HCL 10 G IV SOLR
1250.0000 mg | INTRAVENOUS | Status: DC
  Administered 2016-10-12 – 2016-10-14 (×3): 1250 mg via INTRAVENOUS
  Filled 2016-10-11 (×4): qty 1250

## 2016-10-11 MED ORDER — WARFARIN - PHARMACIST DOSING INPATIENT
Freq: Every day | Status: DC
  Administered 2016-10-14: 18:00:00

## 2016-10-11 MED ORDER — VANCOMYCIN HCL 10 G IV SOLR
1250.0000 mg | Freq: Two times a day (BID) | INTRAVENOUS | Status: DC
  Administered 2016-10-11: 1250 mg via INTRAVENOUS
  Filled 2016-10-11: qty 1250

## 2016-10-11 NOTE — Consult Note (Signed)
[place] Nurse wound consult note Reason for Consult:right hand s/p I&D.  Wound type: infectious Pressure Ulcer POA: No Measurement:1.2cm x 1.2cm with no depth.  Depth created by I&D has sealed over with dried serum.   Wound bed: No visible wound bed Drainage (amount, consistency, odor) No exudate, no odor Periwound: Erythema measuring 3.5cm round, moderate warmth, no induration. Edema evident. Dressing procedure/placement/frequency: Discussed with Dr. Wyline Copas, conservative wound care (saline dressing twice daily) and elevation implemented.  Recommend consultation with orthopedics, perhaps referral to outpatient wound care center locally (sees one in [place] for LEs-that have no wounds). [facility] nursing team will not follow, but will remain available to this patient, the nursing and medical teams.  Please re-consult if needed. Thanks, Maudie Flakes, MSN, RN, [place], Arther Abbott  Pager# [phone]

## 2016-10-11 NOTE — Progress Notes (Signed)
Pharmacy Antibiotic Note  [name] is a 66 y.o. female presented to ED from home on 10/09/16 after falling twice and complaints of hand pain/swelling/redness where she had a blood draw.  Patient was found to have right hand cellulitis/abscess with I&D performed and cefazolin started on admission. Culture from abscess showed moderate staph aureus. To start vancomycin on 1/4 for MRSA coverage.   Plan: - vancomycin 1250 mg IV q24h (goal trough 10-15)  - continue cefazolin 1gm IV q8h  - f/u cultures  ___________________________________  Height: 5\' 2"  (157.5 cm) Weight: 145 lb 15.1 oz (66.2 kg) IBW/kg (Calculated) : 50.1  Temp (24hrs), Avg:98.1 F (36.7 C), Min:97.7 F (36.5 C), Max:98.5 F (36.9 C)   Recent Labs Lab 10/09/16 1213 10/10/16 0559 10/11/16 0544  WBC 5.0 3.5* 3.8*  CREATININE 0.89 0.97  --     Estimated Creatinine Clearance: 50.9 mL/min (by C-G formula based on SCr of 0.97 mg/dL).    Allergies  Allergen Reactions  . Formaldehyde     Other reaction(s): Shortness Of Breath, coughing  . Altace [Ramipril] Cough  . Dilaudid [Hydromorphone] Itching    Drip OK, pills cause itching    Antimicrobials this admission:  1/2 Cefazolin >> 1/4 vancomycin>>  Dose adjustments this admission:  n/a  Microbiology results:  10/09/16 hand abscess:  few GPC in clusters, moderate staph aureus  Thank you for allowing pharmacy to be a part of this patient's care.  Lynelle Doctor 10/11/2016 11:11 AM

## 2016-10-11 NOTE — Progress Notes (Signed)
PROGRESS NOTE    [name]  [id number] DOB: [birth year] DOA: 10/09/2016 PCP: Joya Gaskins, MD    Brief Narrative:  66 y.o. female with a history of anemia, chronic back pain, fibromyalgia, s/p multiple orthopedic surgeries, depression, GERD, HTN, T2DM, hypothyroidism, and lichen planus who presented to the ED for frequent falls and shortness of breath.   She reports frequent falls without preceding symptoms, described as just due to sudden weakness in her legs. She was admitted in July 2017 for this and discharged to SNF for rehabilitation. She denies LOC, but has a hard time remembering the details of these events.   She has chronic pain due to osteoarthritis in her knees and back for which she takes oxycodone and ibuprofen. Her shortness of breath has worsened over the past 2 - 3 days, is intermittent, with exertion only and is not associated with chest pain or palpitations.   On arrival she was afebrile, 100% on room air and tachycardic at 122bpm. BP 165/76. Orthostatics were attempted but she was unable to stand. ED work up showed right-sided PE's on CT. She also reported right hand pain worsening for 2 months, becoming red, swollen and painful following labwork drawn from right hand dorsum. Abscess was noted on exam and I&D'ed by EDP. Antibiotics were started and hand surgery was consulted  Assessment & Plan:   Active Problems:   Spondylolisthesis of lumbar region   Hypothyroidism   Hypertension   Anemia   GERD (gastroesophageal reflux disease)   Weakness   Fall   Pulmonary embolism ([place])   Pulmonary Emboli:  - Confirmed on CT Chest without radiographic evidence of right heart strain. - Transition to PO xarelto today.  - No LE DVT on dopplers, reviewed - 2d echo reviewed. Normal LVEF, unremarkable study - Stable at present  Weakness/falls:  -Seen by Dr. Krista Blue. Was at SNF following last admission.  -Unable to obtain orthostatics in ED. CT head negative. - Up  with assistance - PT/OT with recommendations for home health - Outpatient records reviewed. Per Dr. Krista Blue, EMG nerve conduction study in 3/17 demonstrated mild chronic L lumbar radiculopathy mainly involving left L4-5 myotomes. Patient was prescribed trileptal 150mg  qhs for muscle cramping by Dr. Krista Blue - Stable at present  Right hand abscess:  - s/p I&D in ED - Patient on empiric ancef - Wound culture has grown staph species - Will add vancomycin to cover MRSA - Discussed with WOC. Hand assessed. Appears more swollen - Will re-assess after addition of vanc - If no significant improvement, then would re-discuss with hand surgeon  Hypoglycemia: CBG 44 in ED improved with po. ?adrenal insufficiency in setting of chronic steroid use for ?lichen planus per dermatology.  - AM cortisol within normal limits - Stable at present  Chronic low back pain/fibromyalgia/lumbar spondylolisthesis: - Continue gabapentin 600 mg by mouth 3 times a day. - Continue Flexeril 10 mg by mouth 3 times a day as needed. - Continue oxycodone 20 mg by mouth 3 times a day as needed - Currently stable  Hypothyroidism: TSH wnl July 2017 - For now, will continue levothyroxine 25 g daily..  Hypertension: Chronic, stable - Continue home medications. No AKI. - BP currently remains stable  Type 2 diabetes mellitus:  - Suspect steroid-induced as she's not been on steroids lately and last HbA1c 5.5% in Aug 2017.  - Will continue SSI coverage as needed  Anemia: Chronic, stable. Hgb 11.2, normocytic indices on admission. - stable. - will recheck CBC in AM  GERD:  - Chronic, stable. - will continue PPI as tolerated  DVT prophylaxis: lovenox subq Code Status: full Family Communication: Pt in room Disposition Plan: Uncertain at this time  Consultants:     Procedures:     Antimicrobials: Anti-infectives    Start     Dose/Rate Route Frequency Ordered Stop   10/12/16 1000  vancomycin (VANCOCIN) 1,250  mg in sodium chloride 0.9 % 250 mL IVPB     1,250 mg 166.7 mL/hr over 90 Minutes Intravenous Every 24 hours 10/11/16 1117     10/11/16 1000  vancomycin (VANCOCIN) 1,250 mg in sodium chloride 0.9 % 250 mL IVPB  Status:  Discontinued     1,250 mg 166.7 mL/hr over 90 Minutes Intravenous Every 12 hours 10/11/16 0941 10/11/16 1117   10/10/16 0000  ceFAZolin (ANCEF) IVPB 1 g/50 mL premix     1 g 100 mL/hr over 30 Minutes Intravenous Every 8 hours 10/09/16 1938     10/09/16 1630  ceFAZolin (ANCEF) IVPB 1 g/50 mL premix     1 g 100 mL/hr over 30 Minutes Intravenous  Once 10/09/16 1618 10/09/16 1758      Subjective: No complaints at this time  Objective: Vitals:   10/10/16 1432 10/10/16 2155 10/11/16 0632 10/11/16 1443  BP: (!) 144/89 (!) 158/85 (!) 106/50 (!) 131/50  Pulse: (!) 108 96 96 95  Resp: 18 18 18 18   Temp: 97.7 F (36.5 C) 98.2 F (36.8 C) 98.5 F (36.9 C) 98.9 F (37.2 C)  TempSrc: Oral Oral Oral Oral  SpO2: 100% 98% 96% 100%  Weight:      Height:        Intake/Output Summary (Last 24 hours) at 10/11/16 1701 Last data filed at 10/11/16 1500  Gross per 24 hour  Intake              200 ml  Output                0 ml  Net              200 ml   Filed Weights   10/09/16 1105 10/09/16 2043  Weight: 65.3 kg (144 lb) 66.2 kg (145 lb 15.1 oz)    Examination:  General exam: Sitting in chair, in nad Respiratory system: normal resp effort, no audible wheezing Cardiovascular system: regular rate, s1, s2 Gastrointestinal system: soft, nondistended, pos BS Central nervous system: cn2-12 grossly intact, strength intact Extremities: perfused, no clubbing Skin: normal skin turgor, increased swelling over dorsum of R hand, no active drainage Psychiatry: mood normal//no visual hallucinations  Data Reviewed: I have personally reviewed following labs and imaging studies  CBC:  Recent Labs Lab 10/09/16 1213 10/10/16 0559 10/11/16 0544  WBC 5.0 3.5* 3.8*  NEUTROABS 4.0   --   --   HGB 11.2* 9.1* 9.3*  HCT 33.3* 27.3* 27.9*  MCV 93.0 92.5 92.4  PLT 138* 116* 123XX123*   Basic Metabolic Panel:  Recent Labs Lab 10/09/16 1213 10/10/16 0559  NA 138 139  K 4.2 3.6  CL 106 108  CO2 19* 25  GLUCOSE 55* 86  BUN 20 17  CREATININE 0.89 0.97  CALCIUM 8.5* 7.9*   GFR: Estimated Creatinine Clearance: 50.9 mL/min (by C-G formula based on SCr of 0.97 mg/dL). Liver Function Tests:  Recent Labs Lab 10/09/16 1213  AST 34  ALT 32  ALKPHOS 89  BILITOT 1.7*  PROT 5.3*  ALBUMIN 2.5*   No results for input(s): LIPASE, AMYLASE in  the last 168 hours. No results for input(s): AMMONIA in the last 168 hours. Coagulation Profile: No results for input(s): INR, PROTIME in the last 168 hours. Cardiac Enzymes: No results for input(s): CKTOTAL, CKMB, CKMBINDEX, TROPONINI in the last 168 hours. BNP (last 3 results) No results for input(s): PROBNP in the last 8760 hours. HbA1C: No results for input(s): HGBA1C in the last 72 hours. CBG:  Recent Labs Lab 10/10/16 1727 10/10/16 2153 10/11/16 0749 10/11/16 1223 10/11/16 1640  GLUCAP 97 170* 82 110* 130*   Lipid Profile: No results for input(s): CHOL, HDL, LDLCALC, TRIG, CHOLHDL, LDLDIRECT in the last 72 hours. Thyroid Function Tests: No results for input(s): TSH, T4TOTAL, FREET4, T3FREE, THYROIDAB in the last 72 hours. Anemia Panel: No results for input(s): VITAMINB12, FOLATE, FERRITIN, TIBC, IRON, RETICCTPCT in the last 72 hours. Sepsis Labs: No results for input(s): PROCALCITON, LATICACIDVEN in the last 168 hours.  Recent Results (from the past 240 hour(s))  Aerobic Culture (superficial specimen)     Status: None (Preliminary result)   Collection Time: 10/09/16  4:09 PM  Result Value Ref Range Status   Specimen Description ABSCESS  Final   Special Requests NONE  Final   Gram Stain   Final    FEW WBC PRESENT, PREDOMINANTLY PMN FEW GRAM POSITIVE COCCI IN CLUSTERS    Culture   Final    MODERATE  STAPHYLOCOCCUS AUREUS SUSCEPTIBILITIES TO FOLLOW Performed at [facility]    Report Status PENDING  Incomplete     Radiology Studies: No results found.  Scheduled Meds: .  ceFAZolin (ANCEF) IV  1 g Intravenous Q8H  . coumadin book   Does not apply Once  . enoxaparin (LOVENOX) injection  65 mg Subcutaneous Q12H  . gabapentin  300 mg Oral QHS  . insulin aspart  0-15 Units Subcutaneous TID WC  . insulin aspart  0-5 Units Subcutaneous QHS  . levothyroxine  25 mcg Oral QAC breakfast  . meloxicam  7.5 mg Oral Daily  . multivitamin with minerals  1 tablet Oral Daily  . pantoprazole  80 mg Oral Daily  . pneumococcal 23 valent vaccine  0.5 mL Intramuscular Tomorrow-1000  . sodium chloride flush  3 mL Intravenous Q12H  . [START ON 10/12/2016] vancomycin  1,250 mg Intravenous Q24H  . warfarin  4 mg Oral ONCE-1800  . warfarin   Does not apply Once  . Warfarin - Pharmacist Dosing Inpatient   Does not apply q1800   Continuous Infusions:   LOS: 1 day   Stiven Kaspar, Orpah Melter, MD [facility] Pager [phone]  If 7PM-7AM, please contact night-coverage [email or web address] Password [email or web address] 10/11/2016, 5:01 PM

## 2016-10-11 NOTE — Progress Notes (Signed)
Occupational Therapy Treatment Patient Details Name: SHANNAH VANNATTA MRN: [id number] DOB: 05-20-1951 Today's Date: 10/11/2016    History of present illness SISTINE HASLETT is an 66 y.o. right handed female who presents with   Several days of increasing pain and swelling to right dorsal hand. Dr Lenon Curt performed I and D on R hand 10/10/2015.  Pt newly dx PE   OT comments   Spoke with pt attending regarding pts R hand ( increased pain and increased edema)  Follow Up Recommendations  Home health OT;Supervision/Assistance - 24 hour    Equipment Recommendations  None recommended by OT    Recommendations for Other Services      Precautions / Restrictions Precautions Precaution Comments: painful R hand this day       Mobility Bed Mobility               General bed mobility comments: pt sitting EOB  Transfers Overall transfer level: Needs assistance   Transfers: Sit to/from Stand;Stand Pivot Transfers Sit to Stand: Min guard Stand pivot transfers: Min guard            Balance Overall balance assessment: History of Falls                                 ADL   Eating/Feeding: Minimal assistance;Sitting Eating/Feeding Details (indicate cue type and reason): needed A opening lunch items this day                     Toilet Transfer: Cueing for sequencing;Cueing for safety;Min guard   Toileting- Clothing Manipulation and Hygiene: Sit to/from stand;Cueing for safety;Cueing for sequencing;Min guard                          Cognition   Behavior During Therapy: [facility] for tasks assessed/performed Overall Cognitive Status: Within Functional Limits for tasks assessed                         Exercises Hand Exercises Digit Composite Flexion: AROM;Right;5 reps Composite Extension: AROM;5 reps Digit Composite Abduction: AROM;5 reps Digit Composite Adduction: AROM;5 reps Opposition: AAROM;5 reps;Right;Other (comment) (limited by pain)    Shoulder Instructions       General Comments      Pertinent Vitals/ Pain       Pain Score: 5  Pain Location:  R hand Pain Descriptors / Indicators: Pressure;Sore Pain Intervention(s): Monitored during session;Repositioned;Limited activity within patient's tolerance         Frequency  Min 2X/week        Progress Toward Goals  OT Goals(current goals can now be found in the care plan section)  Progress towards OT goals: Progressing toward goals     Plan         End of Session     Activity Tolerance Patient limited by pain (pts hand with increased pain and edema this day.)   Patient Left in chair;with call bell/phone within reach;with nursing/sitter in room   Nurse Communication Mobility status        Time: 1256-1316 OT Time Calculation (min): 20 min  Charges: OT General Charges $OT Visit: 1 Procedure OT Treatments $Self Care/Home Management : 8-22 mins  Hodan Wurtz, Edwena Felty D 10/11/2016, 1:22 PM

## 2016-10-11 NOTE — Care Management Note (Signed)
Case Management Note  Patient Details  Name: [name] MRN: [id number] Date of Birth: 07-11-1951  Subjective/Objective:  [facility] chosen for [employer] following-awaiting HHRN/,HH aide, f14f order.                  Action/Plan:d/c home w/HHC.   Expected Discharge Date:   (unknown)               Expected Discharge Plan:  [place]  In-House Referral:  NA  Discharge planning Services  CM Consult  Post Acute Care Choice:  Home Health Choice offered to:  Patient  DME Arranged:  N/A DME Agency:  NA  HH Arranged:    [place] Agency:  [facility]  Status of Service:  In process, will continue to follow  If discussed at Long Length of Stay Meetings, dates discussed:    Additional Comments:  Dessa Phi, RN 10/11/2016, 11:48 AM

## 2016-10-11 NOTE — Progress Notes (Signed)
ANTICOAGULATION CONSULT NOTE - Follow Up Consult  Pharmacy Consult for xarelto --> Lovenox/warfarin Indication: new pulmonary embolus  Allergies  Allergen Reactions  . Formaldehyde     Other reaction(s): Shortness Of Breath, coughing  . Altace [Ramipril] Cough  . Dilaudid [Hydromorphone] Itching    Drip OK, pills cause itching    Patient Measurements: Height: 5\' 2"  (157.5 cm) Weight: 145 lb 15.1 oz (66.2 kg) IBW/kg (Calculated) : 50.1 Heparin Dosing Weight:   Vital Signs: Temp: 98.5 F (36.9 C) (01/04 0632) Temp Source: Oral (01/04 0632) BP: 106/50 (01/04 [id number]) Pulse Rate: 96 (01/04 0632)  Labs:  Recent Labs  10/09/16 1213 10/10/16 0559 10/11/16 0544  HGB 11.2* 9.1* 9.3*  HCT 33.3* 27.3* 27.9*  PLT 138* 116* 108*  CREATININE 0.89 0.97  --     Estimated Creatinine Clearance: 50.9 mL/min (by C-G formula based on SCr of 0.97 mg/dL).   Assessment: 19 yoF presented to ED from home on 10/09/16 after falling twice and complaints of hand pain/swelling/redness where she had a blood draw.  CTa shows pulmonary emboli in several proximal right upper lobe pulmonary artery branches as well as in the posterior segment right upper lobe pulmonary artery branch. Patient was started on lovenox on admission and changed to [place] on 1/3 --> to change to lovenox/warfarin on 1/4 since pt's insurance does not cover for xarelto.  Today, 10/11/2016: - day #1 of 5 days minimum of anticoagulation overlap - cbc low and trending down (monitor closely for now) - no bleeding reported -  Last dose of xarelto given at 0840 this morning  Goal of Therapy:  INR 2-3 Anti-Xa level 0.6-1 units/ml 4hrs after LMWH dose given Monitor platelets by anticoagulation protocol: Yes   Plan:  - lovenox 65 mg SQ q12h while inpatient (in case another I&D or other surgical procedures are needed for infected hand).  At discharge, recommend changing to lovenox 100 mg SQ q24h (~1.5 mg/kg/day).  Give first dose at 8 PM  tonight - warfarin 4 mg PO x1 at 1800 - check baseline INR at 10 PM tonight since xarelto can affect INR value - monitor for s/s bleeding  [name] P 10/11/2016,11:19 AM

## 2016-10-12 LAB — CBC
HEMATOCRIT: 26.7 % — AB (ref 36.0–46.0)
Hemoglobin: 9 g/dL — ABNORMAL LOW (ref 12.0–15.0)
MCH: 30.8 pg (ref 26.0–34.0)
MCHC: 33.7 g/dL (ref 30.0–36.0)
MCV: 91.4 fL (ref 78.0–100.0)
Platelets: 97 10*3/uL — ABNORMAL LOW (ref 150–400)
RBC: 2.92 MIL/uL — ABNORMAL LOW (ref 3.87–5.11)
RDW: 14.2 % (ref 11.5–15.5)
WBC: 3.4 10*3/uL — AB (ref 4.0–10.5)

## 2016-10-12 LAB — GLUCOSE, CAPILLARY
GLUCOSE-CAPILLARY: 89 mg/dL (ref 65–99)
GLUCOSE-CAPILLARY: 116 mg/dL — AB (ref 65–99)
Glucose-Capillary: 118 mg/dL — ABNORMAL HIGH (ref 65–99)
Glucose-Capillary: 138 mg/dL — ABNORMAL HIGH (ref 65–99)

## 2016-10-12 LAB — BASIC METABOLIC PANEL
Anion gap: 8 (ref 5–15)
BUN: 15 mg/dL (ref 6–20)
CHLORIDE: 109 mmol/L (ref 101–111)
CO2: 25 mmol/L (ref 22–32)
Calcium: 8 mg/dL — ABNORMAL LOW (ref 8.9–10.3)
Creatinine, Ser: 1.02 mg/dL — ABNORMAL HIGH (ref 0.44–1.00)
GFR calc Af Amer: >60 mL/min (ref >60)
GFR calc non Af Amer: 56 mL/min — ABNORMAL LOW (ref >60)
Glucose, Bld: 94 mg/dL (ref 65–99)
POTASSIUM: 3.2 mmol/L — AB (ref 3.5–5.1)
SODIUM: 142 mmol/L (ref 135–145)

## 2016-10-12 LAB — AEROBIC CULTURE W GRAM STAIN (SUPERFICIAL SPECIMEN)

## 2016-10-12 LAB — PROTIME-INR
INR: 1.51
Prothrombin Time: 18.4 seconds — ABNORMAL HIGH (ref 11.4–15.2)

## 2016-10-12 MED ORDER — POLYETHYLENE GLYCOL 3350 17 G PO PACK
17.0000 g | PACK | Freq: Every day | ORAL | Status: DC
  Administered 2016-10-12 – 2016-10-14 (×3): 17 g via ORAL
  Filled 2016-10-12 (×5): qty 1

## 2016-10-12 MED ORDER — POTASSIUM CHLORIDE CRYS ER 20 MEQ PO TBCR
40.0000 meq | EXTENDED_RELEASE_TABLET | Freq: Once | ORAL | Status: AC
  Administered 2016-10-12: 40 meq via ORAL
  Filled 2016-10-12: qty 2

## 2016-10-12 MED ORDER — DEXTROSE 5 % IV SOLN
1.0000 g | Freq: Two times a day (BID) | INTRAVENOUS | Status: DC
  Administered 2016-10-12 – 2016-10-14 (×5): 1 g via INTRAVENOUS
  Filled 2016-10-12 (×6): qty 1

## 2016-10-12 MED ORDER — WARFARIN SODIUM 2 MG PO TABS
2.0000 mg | ORAL_TABLET | Freq: Once | ORAL | Status: AC
  Administered 2016-10-12: 2 mg via ORAL
  Filled 2016-10-12: qty 1

## 2016-10-12 MED ORDER — PIPERACILLIN-TAZOBACTAM 3.375 G IVPB
3.3750 g | Freq: Three times a day (TID) | INTRAVENOUS | Status: DC
  Administered 2016-10-12: 3.375 g via INTRAVENOUS
  Filled 2016-10-12 (×2): qty 50

## 2016-10-12 MED ORDER — DIPHENHYDRAMINE HCL 50 MG/ML IJ SOLN
12.5000 mg | Freq: Once | INTRAMUSCULAR | Status: AC
  Administered 2016-10-12: 12.5 mg via INTRAVENOUS
  Filled 2016-10-12: qty 1

## 2016-10-12 NOTE — Progress Notes (Signed)
PROGRESS NOTE    [name]  [id number] DOB: 08/30/1951 DOA: 10/09/2016 PCP: Joya Gaskins, MD    Brief Narrative:  66 y.o. female with a history of anemia, chronic back pain, fibromyalgia, s/p multiple orthopedic surgeries, depression, GERD, HTN, T2DM, hypothyroidism, and lichen planus who presented to the ED for frequent falls and shortness of breath.   She reports frequent falls without preceding symptoms, described as just due to sudden weakness in her legs. She was admitted in July 2017 for this and discharged to SNF for rehabilitation. She denies LOC, but has a hard time remembering the details of these events.   She has chronic pain due to osteoarthritis in her knees and back for which she takes oxycodone and ibuprofen. Her shortness of breath has worsened over the past 2 - 3 days, is intermittent, with exertion only and is not associated with chest pain or palpitations.   On arrival she was afebrile, 100% on room air and tachycardic at 122bpm. BP 165/76. Orthostatics were attempted but she was unable to stand. ED work up showed right-sided PE's on CT. She also reported right hand pain worsening for 2 months, becoming red, swollen and painful following labwork drawn from right hand dorsum. Abscess was noted on exam and I&D'ed by EDP. Antibiotics were started and hand surgery was consulted  Assessment & Plan:   Active Problems:   Spondylolisthesis of lumbar region   Hypothyroidism   Hypertension   Anemia   GERD (gastroesophageal reflux disease)   Weakness   Fall   Pulmonary embolism ([name])   Pulmonary Emboli:  - Confirmed on CT Chest without radiographic evidence of right heart strain. - Transition to PO xarelto today.  - No LE DVT on dopplers, reviewed - 2d echo reviewed. Normal LVEF, unremarkable study - Remains stable currently  Weakness/falls:  -Seen by Dr. Krista Blue. Was at SNF following last admission.  -Unable to obtain orthostatics in ED. CT head  negative. - Up with assistance - PT/OT with recommendations for home health, minimal assist noted per char review - Outpatient records reviewed. Per Dr. Krista Blue, EMG nerve conduction study in 3/17 demonstrated mild chronic L lumbar radiculopathy mainly involving left L4-5 myotomes. Patient was prescribed trileptal 150mg  qhs for muscle cramping by Dr. Krista Blue - currently stable  Right hand abscess:  - s/p I&D in ED - Patient on empiric ancef - Wound culture has grown staph species - Will add vancomycin to cover MRSA - Discussed with WOC. Hand assessed. - Initially appeared more swollen and erythematous - Improvement noted with addition of Vanc, however remains swollen - May consider re-eval by hand surgeon - cultures have demonstrated MSSA. Discussed with pharmacy. If improvement noted, consider transition to PO doxy or bactrim  Hypoglycemia: CBG 44 in ED improved with po. ?adrenal insufficiency in setting of chronic steroid use for ?lichen planus per dermatology.  - AM cortisol within normal limits - Stable at present  Chronic low back pain/fibromyalgia/lumbar spondylolisthesis: - Continue gabapentin 600 mg by mouth 3 times a day. - Continue Flexeril 10 mg by mouth 3 times a day as needed. - Continue oxycodone 20 mg by mouth 3 times a day as needed - Currently stable  Hypothyroidism: TSH wnl July 2017 - For now, will continue levothyroxine 25 g daily. - Remains stable.  Hypertension: Chronic, stable - Continue home medications. No AKI. - BP presently stable  Type 2 diabetes mellitus:  - Suspect steroid-induced as she's not been on steroids lately and last HbA1c 5.5% in  Aug 2017.  - glucose stable at present - Continue SSI coverage  Anemia: Chronic, stable. Hgb 11.2, normocytic indices on admission. - stable. - Will repeat CBC in AM  GERD:  - Chronic, stable. - tolerating PPI  DVT prophylaxis: lovenox subq Code Status: full Family Communication: Pt in  room Disposition Plan: Uncertain at this time  Consultants:   Dr. Lenon Curt  Procedures:     Antimicrobials: Anti-infectives    Start     Dose/Rate Route Frequency Ordered Stop   10/12/16 1000  vancomycin (VANCOCIN) 1,250 mg in sodium chloride 0.9 % 250 mL IVPB     1,250 mg 166.7 mL/hr over 90 Minutes Intravenous Every 24 hours 10/11/16 1117     10/12/16 1000  piperacillin-tazobactam (ZOSYN) IVPB 3.375 g     3.375 g 12.5 mL/hr over 240 Minutes Intravenous Every 8 hours 10/12/16 0950     10/11/16 1000  vancomycin (VANCOCIN) 1,250 mg in sodium chloride 0.9 % 250 mL IVPB  Status:  Discontinued     1,250 mg 166.7 mL/hr over 90 Minutes Intravenous Every 12 hours 10/11/16 0941 10/11/16 1117   10/10/16 0000  ceFAZolin (ANCEF) IVPB 1 g/50 mL premix  Status:  Discontinued     1 g 100 mL/hr over 30 Minutes Intravenous Every 8 hours 10/09/16 1938 10/12/16 0949   10/09/16 1630  ceFAZolin (ANCEF) IVPB 1 g/50 mL premix     1 g 100 mL/hr over 30 Minutes Intravenous  Once 10/09/16 1618 10/09/16 1758      Subjective: Without active complaints  Objective: Vitals:   10/11/16 1443 10/11/16 2051 10/12/16 0442 10/12/16 1126  BP: (!) 131/50 134/61 123/70 (!) 161/81  Pulse: 95 99 82 91  Resp: 18 16 18 16   Temp: 98.9 F (37.2 C) 98.6 F (37 C) 98.7 F (37.1 C) 98.7 F (37.1 C)  TempSrc: Oral Oral Oral Oral  SpO2: 100% 99% 97% 100%  Weight:      Height:        Intake/Output Summary (Last 24 hours) at 10/12/16 1742 Last data filed at 10/12/16 0600  Gross per 24 hour  Intake              220 ml  Output                0 ml  Net              220 ml   Filed Weights   10/09/16 1105 10/09/16 2043  Weight: 65.3 kg (144 lb) 66.2 kg (145 lb 15.1 oz)    Examination:  General exam: Laying in bed, in nad Respiratory system: normal chest rise, no wheezing Cardiovascular system: regular rhythm, s1-s2 on auscultation Gastrointestinal system: pos BS, soft, nondistended Central nervous system:  no seizures, no tremors Extremities: no cyanosis, no joint deformities Skin: no rashes, R hand with increased swelling, surrounding erythema Psychiatry: affect normal//no auditory hallucinations  Data Reviewed: I have personally reviewed following labs and imaging studies  CBC:  Recent Labs Lab 10/09/16 1213 10/10/16 0559 10/11/16 0544 10/12/16 0534  WBC 5.0 3.5* 3.8* 3.4*  NEUTROABS 4.0  --   --   --   HGB 11.2* 9.1* 9.3* 9.0*  HCT 33.3* 27.3* 27.9* 26.7*  MCV 93.0 92.5 92.4 91.4  PLT 138* 116* 108* 97*   Basic Metabolic Panel:  Recent Labs Lab 10/09/16 1213 10/10/16 0559 10/12/16 0534  NA 138 139 142  K 4.2 3.6 3.2*  CL 106 108 109  CO2 19* 25  25  GLUCOSE 55* 86 94  BUN 20 17 15   CREATININE 0.89 0.97 1.02*  CALCIUM 8.5* 7.9* 8.0*   GFR: Estimated Creatinine Clearance: 48.4 mL/min (by C-G formula based on SCr of 1.02 mg/dL (H)). Liver Function Tests:  Recent Labs Lab 10/09/16 1213  AST 34  ALT 32  ALKPHOS 89  BILITOT 1.7*  PROT 5.3*  ALBUMIN 2.5*   No results for input(s): LIPASE, AMYLASE in the last 168 hours. No results for input(s): AMMONIA in the last 168 hours. Coagulation Profile:  Recent Labs Lab 10/11/16 2149 10/12/16 0534  INR 1.57 1.51   Cardiac Enzymes: No results for input(s): CKTOTAL, CKMB, CKMBINDEX, TROPONINI in the last 168 hours. BNP (last 3 results) No results for input(s): PROBNP in the last 8760 hours. HbA1C: No results for input(s): HGBA1C in the last 72 hours. CBG:  Recent Labs Lab 10/11/16 1640 10/11/16 2157 10/12/16 0734 10/12/16 1125 10/12/16 1657  GLUCAP 130* 114* 89 116* 118*   Lipid Profile: No results for input(s): CHOL, HDL, LDLCALC, TRIG, CHOLHDL, LDLDIRECT in the last 72 hours. Thyroid Function Tests: No results for input(s): TSH, T4TOTAL, FREET4, T3FREE, THYROIDAB in the last 72 hours. Anemia Panel: No results for input(s): VITAMINB12, FOLATE, FERRITIN, TIBC, IRON, RETICCTPCT in the last 72  hours. Sepsis Labs: No results for input(s): PROCALCITON, LATICACIDVEN in the last 168 hours.  Recent Results (from the past 240 hour(s))  Aerobic Culture (superficial specimen)     Status: None   Collection Time: 10/09/16  4:09 PM  Result Value Ref Range Status   Specimen Description ABSCESS  Final   Special Requests NONE  Final   Gram Stain   Final    FEW WBC PRESENT, PREDOMINANTLY PMN FEW GRAM POSITIVE COCCI IN CLUSTERS Performed at [facility]  Final   Report Status 10/12/2016 FINAL  Final   Organism ID, Bacteria STAPHYLOCOCCUS AUREUS  Final      Susceptibility   Staphylococcus aureus - MIC*    CIPROFLOXACIN <=0.5 SENSITIVE Sensitive     ERYTHROMYCIN <=0.25 SENSITIVE Sensitive     GENTAMICIN <=0.5 SENSITIVE Sensitive     OXACILLIN <=0.25 SENSITIVE Sensitive     TETRACYCLINE <=1 SENSITIVE Sensitive     VANCOMYCIN 1 SENSITIVE Sensitive     TRIMETH/SULFA <=10 SENSITIVE Sensitive     CLINDAMYCIN <=0.25 SENSITIVE Sensitive     RIFAMPIN <=0.5 SENSITIVE Sensitive     Inducible Clindamycin NEGATIVE Sensitive     * MODERATE STAPHYLOCOCCUS AUREUS     Radiology Studies: No results found.  Scheduled Meds: . enoxaparin (LOVENOX) injection  65 mg Subcutaneous Q12H  . gabapentin  300 mg Oral QHS  . insulin aspart  0-15 Units Subcutaneous TID WC  . insulin aspart  0-5 Units Subcutaneous QHS  . levothyroxine  25 mcg Oral QAC breakfast  . meloxicam  7.5 mg Oral Daily  . multivitamin with minerals  1 tablet Oral Daily  . pantoprazole  80 mg Oral Daily  . piperacillin-tazobactam (ZOSYN)  IV  3.375 g Intravenous Q8H  . pneumococcal 23 valent vaccine  0.5 mL Intramuscular Tomorrow-1000  . polyethylene glycol  17 g Oral Daily  . sodium chloride flush  3 mL Intravenous Q12H  . vancomycin  1,250 mg Intravenous Q24H  . warfarin  2 mg Oral ONCE-1800  . warfarin   Does not apply Once  . Warfarin - Pharmacist Dosing Inpatient   Does not  apply q1800   Continuous Infusions:  LOS: 2 days   CHIU, Orpah Melter, MD [facility] Pager [phone]  If 7PM-7AM, please contact night-coverage [email or web address] Password [email or web address] 10/12/2016, 5:42 PM

## 2016-10-12 NOTE — Progress Notes (Signed)
Occupational Therapy Treatment Patient Details Name: [name] MRN: [id number] DOB: June 20, 1951 Today's Date: 10/12/2016    History of present illness [name] is an 66 y.o. right handed female who presents with   Several days of increasing pain and swelling to right dorsal hand. Dr Lenon Curt performed I and D on R hand 10/10/2015.  Pt newly dx PE   OT comments  Pt progressing toward OT goals  Follow Up Recommendations  Home health OT;Supervision/Assistance - 24 hour    Equipment Recommendations  None recommended by OT       Precautions / Restrictions Precautions Precautions: Fall       Mobility Bed Mobility         Supine to sit: Modified independent (Device/Increase time)        Transfers Overall transfer level: Needs assistance   Transfers: Sit to/from Stand;Stand Pivot Transfers Sit to Stand: Min guard Stand pivot transfers: Min guard       General transfer comment: verbal cues for safety        ADL       Grooming: Standing;Brushing hair;Min guard Grooming Details (indicate cue type and reason): pt able to use R hand this day for ADL activty although it was still painful improvement from yesterday     Lower Body Bathing: Minimal assistance;Sit to/from stand;Cueing for safety   Upper Body Dressing : Set up;Sitting   Lower Body Dressing: Minimal assistance;Sit to/from stand;Cueing for safety;Cueing for sequencing   Toilet Transfer: Supervision/safety;Ambulation;Cueing for sequencing;Cueing for safety   Toileting- Clothing Manipulation and Hygiene: Supervision/safety;Sit to/from stand;Cueing for safety;Cueing for sequencing       Functional mobility during ADLs: Min guard                  Cognition   Behavior During Therapy: [facility] for tasks assessed/performed Overall Cognitive Status: Within Functional Limits for tasks assessed                         Exercises Hand Exercises Digit Composite Flexion: AROM;Right;5  reps Composite Extension: AROM;5 reps Digit Composite Abduction: AROM;5 reps Digit Composite Adduction: AROM;5 reps Opposition: AAROM;5 reps;Right;Other (comment) (limited by pain)   Shoulder Instructions            Pertinent Vitals/ Pain       Pain Score: 4  Pain Location: r hand Pain Intervention(s): Monitored during session;Repositioned         Frequency  Min 2X/week        Progress Toward Goals  OT Goals(current goals can now be found in the care plan section)        Plan         End of Session     Activity Tolerance Patient limited by pain (pts hand with increased pain and edema this day.)   Patient Left in chair;with call bell/phone within reach;with nursing/sitter in room   Nurse Communication Mobility status        Time: 1010-1045 OT Time Calculation (min): 35 min  Charges: OT General Charges $OT Visit: 1 Procedure OT Treatments $Self Care/Home Management : 8-22 mins $Therapeutic Exercise: 8-22 mins  Kingsly Kloepfer D 10/12/2016, 12:59 PM

## 2016-10-12 NOTE — Progress Notes (Signed)
Pt. Reported to RN that she felt flushed and was itching all over. Upon assessment, RN noticed red spots bilat lower extremities and lower abdomen. Zosyn stopped immediately and flushed with NS. On call MD made aware. Benadryl given. Will continue to monitor pt. Closely.

## 2016-10-12 NOTE — Progress Notes (Signed)
Occupational Therapy Treatment Patient Details Name: [name] MRN: [id number] DOB: Jul 01, 1951 Today's Date: 10/12/2016    History of present illness [name] is an 66 y.o. right handed female who presents with   Several days of increasing pain and swelling to right dorsal hand. Dr Lenon Curt performed I and D on R hand 10/10/2015.  Pt newly dx PE   OT comments  Pt with increased ability to use R hand this day although still painful  Follow Up Recommendations  Home health OT;Supervision/Assistance - 24 hour    Equipment Recommendations  None recommended by OT    Recommendations for Other Services      Precautions / Restrictions Precautions Precautions: Fall       Mobility Bed Mobility         Supine to sit: Modified independent (Device/Increase time)        Transfers Overall transfer level: Needs assistance   Transfers: Sit to/from Stand;Stand Pivot Transfers Sit to Stand: Min guard Stand pivot transfers: Min guard       General transfer comment: verbal cues for safety        ADL       Grooming: Standing;Brushing hair;Min guard Grooming Details (indicate cue type and reason): pt able to use R hand this day for ADL activty although it was still painful improvement from yesterday     Lower Body Bathing: Minimal assistance;Sit to/from stand;Cueing for safety   Upper Body Dressing : Set up;Sitting   Lower Body Dressing: Minimal assistance;Sit to/from stand;Cueing for safety;Cueing for sequencing   Toilet Transfer: Supervision/safety;Ambulation;Cueing for sequencing;Cueing for safety   Toileting- Clothing Manipulation and Hygiene: Supervision/safety;Sit to/from stand;Cueing for safety;Cueing for sequencing       Functional mobility during ADLs: Min guard                  Cognition   Behavior During Therapy: [facility] for tasks assessed/performed Overall Cognitive Status: Within Functional Limits for tasks assessed                         Exercises Hand Exercises Digit Composite Flexion: AROM;Right;5 reps Composite Extension: AROM;5 reps Digit Composite Abduction: AROM;5 reps Digit Composite Adduction: AROM;5 reps Opposition: AAROM;5 reps;Right;Other (comment) (limited by pain)   Shoulder Instructions       General Comments      Pertinent Vitals/ Pain       Pain Score: 4  Pain Location: r hand Pain Intervention(s): Monitored during session;Repositioned     Prior Functioning/Environment              Frequency  Min 2X/week                  End of Session     Activity Tolerance Patient limited by pain (pts hand with increased pain and edema this day.)   Patient Left in chair;with call bell/phone within reach;with nursing/sitter in room   Nurse Communication Mobility status         Betsy Pries 10/12/2016, 12:56 PM

## 2016-10-12 NOTE — Progress Notes (Signed)
Pharmacy Antibiotic Note  [name] is a 66 y.o. female presented to ED from home on 10/09/16 after falling twice and complaints of hand pain/swelling/redness where she had a blood draw.  Patient was found to have right hand cellulitis/abscess with I&D performed and cefazolin started on admission. Culture from abscess showed moderate staph aureus. To start vancomycin on 1/4 for MRSA coverage.  Zosyn changed to Cefepime per pharmacy on 10/12/16 (noted new Zosyn allergy added with reaction of flushing/itching).   Plan: - Cefepime 1g IV q12h - Continue vancomycin 1250 mg IV q24h (goal trough 10-15)  - Measure Vanc trough at steady state. - Follow up narrowing antibiotics for MSSA ? - Follow up renal fxn, culture results, and clinical course.   ___________________________________  Height: 5\' 2"  (157.5 cm) Weight: 145 lb 15.1 oz (66.2 kg) IBW/kg (Calculated) : 50.1  Temp (24hrs), Avg:98.7 F (37.1 C), Min:98.6 F (37 C), Max:98.7 F (37.1 C)   Recent Labs Lab 10/09/16 1213 10/10/16 0559 10/11/16 0544 10/12/16 0534  WBC 5.0 3.5* 3.8* 3.4*  CREATININE 0.89 0.97  --  1.02*    Estimated Creatinine Clearance: 48.4 mL/min (by C-G formula based on SCr of 1.02 mg/dL (H)).    Allergies  Allergen Reactions  . Formaldehyde     Other reaction(s): Shortness Of Breath, coughing  . Altace [Ramipril] Cough  . Dilaudid [Hydromorphone] Itching    Drip OK, pills cause itching  . Zosyn [Piperacillin Sod-Tazobactam So] Itching and Other (See Comments)    flushing    Antimicrobials this admission:  1/2 Cefazolin >> 1/5 1/4 vancomycin>> 1/5 zosyn>> 1/5 1/5 Cefepime >>  Dose adjustments this admission:  n/a  Microbiology results:  10/09/16 hand abscess:  MSSA (pan sens) FINAL  Thank you for allowing pharmacy to be a part of this patient's care.  Gretta Arab PharmD, BCPS Pager [phone] 10/12/2016 8:15 PM

## 2016-10-12 NOTE — Progress Notes (Signed)
ANTICOAGULATION CONSULT NOTE - Follow Up Consult  Pharmacy Consult for xarelto --> Lovenox/warfarin Indication: new pulmonary embolus  Allergies  Allergen Reactions  . Formaldehyde     Other reaction(s): Shortness Of Breath, coughing  . Altace [Ramipril] Cough  . Dilaudid [Hydromorphone] Itching    Drip OK, pills cause itching    Patient Measurements: Height: 5\' 2"  (157.5 cm) Weight: 145 lb 15.1 oz (66.2 kg) IBW/kg (Calculated) : 50.1 Heparin Dosing Weight:   Vital Signs: Temp: 98.7 F (37.1 C) (01/05 0442) Temp Source: Oral (01/05 0442) BP: 123/70 (01/05 0442) Pulse Rate: 82 (01/05 0442)  Labs:  Recent Labs  10/09/16 1213 10/10/16 0559 10/11/16 0544 10/11/16 2149 10/12/16 0534  HGB 11.2* 9.1* 9.3*  --  9.0*  HCT 33.3* 27.3* 27.9*  --  26.7*  PLT 138* 116* 108*  --  97*  LABPROT  --   --   --  18.9* 18.4*  INR  --   --   --  1.57 1.51  CREATININE 0.89 0.97  --   --  1.02*    Estimated Creatinine Clearance: 48.4 mL/min (by C-G formula based on SCr of 1.02 mg/dL (H)).   Assessment: 21 yoF presented to ED from home on 10/09/16 after falling twice and complaints of hand pain/swelling/redness where she had a blood draw.  CTa shows pulmonary emboli in several proximal right upper lobe pulmonary artery branches as well as in the posterior segment right upper lobe pulmonary artery branch. Patient was started on lovenox on admission and changed to [place] on 1/3 --> to change to lovenox/warfarin on 1/4 since pt's insurance does not cover for xarelto.  Today, 10/12/2016: - day #2 of 5 days minimum of anticoagulation overlap - cbc low and trending down (monitor closely for now) - no bleeding reported - INR is subtherapeutic at 1.51 (appears baseline was 1.57) - drug-drug intxns: abx may increase INR  Goal of Therapy:  INR 2-3 Anti-Xa level 0.6-1 units/ml 4hrs after LMWH dose given Monitor platelets by anticoagulation protocol: Yes   Plan:  - cotinue lovenox 65 mg SQ  q12h while inpatient (in case another I&D or other surgical procedures are needed for infected hand).  At discharge, recommend changing to lovenox 100 mg SQ q24h (~1.5 mg/kg/day).   - with elevated baseline INR and on abx (that can increase INR), will be conservative with warfarin dosing and give 2 mg PO x1 at 1800 - monitor for s/s bleeding  [name] P 10/12/2016,7:59 AM

## 2016-10-12 NOTE — Progress Notes (Signed)
Physical Therapy Treatment Patient Details Name: [name] MRN: [id number] DOB: 1951-09-18 Today's Date: 10/12/2016    History of Present Illness [name] is an 66 y.o. right handed female who presents with   Several days of increasing pain and swelling to right dorsal hand. Dr Lenon Curt performed I and D on R hand 10/10/2015.  Pt newly dx PE    PT Comments    Pt is progressing well; she is mobilizing better, amb 15' with RW, 20' without AD, no LOB with amb or dynamic activities; pt does not feels she needs HHPT--PT in agreement at this time; she demo's her (OT)hand exercises I'ly and is doing her baths/hygiene without assist as well-- per pt; will continue to follow but decr freq to 2x/wk; pt should amb with staff as well  Follow Up Recommendations  No PT follow up     Equipment Recommendations  None recommended by PT    Recommendations for Other Services       Precautions / Restrictions Precautions Precautions: Fall Precaution Comments: R hand dressing Restrictions Weight Bearing Restrictions: No    Mobility  Bed Mobility         Supine to sit: Modified independent (Device/Increase time)     General bed mobility comments: pt sitting EOB  Transfers Overall transfer level: Modified independent Equipment used: Rolling walker (2 wheeled) Transfers: Sit to/from Stand Sit to Stand: Modified independent (Device/Increase time) Stand pivot transfers: Min guard       General transfer comment: verbal cues for safety  Ambulation/Gait Ambulation/Gait assistance: Supervision;Modified independent (Device/Increase time) Ambulation Distance (Feet): 450 Feet Assistive device: Rolling walker (2 wheeled);None Gait Pattern/deviations: Step-through pattern;Trunk flexed     General Gait Details: pt amb ~22' without AD, she is able to go into bathroom, place objects on shelf,  maneuver in tighter space of bathroom without difficulty, no LOB   Stairs             Wheelchair Mobility    Modified Rankin (Stroke Patients Only)       Balance Overall balance assessment: History of Falls (unknown etiology per pt) Sitting-balance support: No upper extremity supported;Feet supported Sitting balance-Leahy Scale: Normal     Standing balance support: No upper extremity supported;During functional activity Standing balance-Leahy Scale: Good               High level balance activites: Side stepping;Backward walking;Direction changes;Turns;Head turns High Level Balance Comments: no LOB with above; pt is mobilizing in room I'ly; discussed safety measures at home although pt's previous falls have happened suddenly, she describes as "sinking" although no LOC or dizziness; she feels it is a neurlogical issue, pt sees Dr Krista Blue;    Cognition Arousal/Alertness: Awake/alert Behavior During Therapy: [facility] for tasks assessed/performed Overall Cognitive Status: Within Functional Limits for tasks assessed                      Exercises Hand Exercises Digit Composite Flexion: AROM;Right;5 reps Composite Extension: AROM;5 reps Digit Composite Abduction: AROM;5 reps Digit Composite Adduction: AROM;5 reps Opposition:  (pt return demo's OT HEP I'ly for PT)    General Comments        Pertinent Vitals/Pain Pain Assessment: Faces Pain Score: 4  Faces Pain Scale: Hurts a little bit Pain Location: r hand Pain Descriptors / Indicators: Pressure;Sore Pain Intervention(s): Limited activity within patient's tolerance;Monitored during session;Premedicated before session    Home Living  Prior Function            PT Goals (current goals can now be found in the care plan section) Acute Rehab PT Goals Patient Stated Goal: go home to my [place] PT Goal Formulation: With patient Time For Goal Achievement: 10/17/16 Potential to Achieve Goals: Good Progress towards PT goals: Progressing toward goals    Frequency    Min  2X/week      PT Plan Current plan remains appropriate;Frequency needs to be updated    Co-evaluation             End of Session   Activity Tolerance: Patient tolerated treatment well Patient left: Other (comment);with call bell/phone within reach (EOB)     Time: [id number] PT Time Calculation (min) (ACUTE ONLY): 25 min  Charges:  $Gait Training: 23-37 mins                    G Codes:      Mekisha Bittel 2016/10/17, 1:08 PM

## 2016-10-13 LAB — BASIC METABOLIC PANEL
Anion gap: 8 (ref 5–15)
BUN: 17 mg/dL (ref 6–20)
CHLORIDE: 107 mmol/L (ref 101–111)
CO2: 23 mmol/L (ref 22–32)
CREATININE: 0.84 mg/dL (ref 0.44–1.00)
Calcium: 8.1 mg/dL — ABNORMAL LOW (ref 8.9–10.3)
GFR calc Af Amer: >60 mL/min (ref >60)
GFR calc non Af Amer: >60 mL/min (ref >60)
Glucose, Bld: 99 mg/dL (ref 65–99)
POTASSIUM: 3.9 mmol/L (ref 3.5–5.1)
Sodium: 138 mmol/L (ref 135–145)

## 2016-10-13 LAB — GLUCOSE, CAPILLARY
GLUCOSE-CAPILLARY: 134 mg/dL — AB (ref 65–99)
Glucose-Capillary: 102 mg/dL — ABNORMAL HIGH (ref 65–99)
Glucose-Capillary: 124 mg/dL — ABNORMAL HIGH (ref 65–99)

## 2016-10-13 LAB — PROTIME-INR
INR: 2.03
Prothrombin Time: 23.2 seconds — ABNORMAL HIGH (ref 11.4–15.2)

## 2016-10-13 MED ORDER — WARFARIN SODIUM 2 MG PO TABS
2.0000 mg | ORAL_TABLET | Freq: Once | ORAL | Status: DC
  Filled 2016-10-13: qty 1

## 2016-10-13 MED ORDER — ENOXAPARIN SODIUM 80 MG/0.8ML ~~LOC~~ SOLN
65.0000 mg | Freq: Two times a day (BID) | SUBCUTANEOUS | Status: DC
  Administered 2016-10-14 – 2016-10-16 (×5): 65 mg via SUBCUTANEOUS
  Filled 2016-10-13 (×5): qty 0.8

## 2016-10-13 MED ORDER — LIDOCAINE HCL 1 % IJ SOLN
INTRAMUSCULAR | Status: AC
  Administered 2016-10-13: 14:00:00
  Filled 2016-10-13: qty 20

## 2016-10-13 NOTE — Progress Notes (Addendum)
ANTICOAGULATION CONSULT NOTE - Follow Up Consult  Pharmacy Consult for xarelto --> Lovenox/warfarin Indication: new pulmonary embolus, Dopplers negative for LE VTE  Allergies  Allergen Reactions  . Formaldehyde     Other reaction(s): Shortness Of Breath, coughing  . Altace [Ramipril] Cough  . Dilaudid [Hydromorphone] Itching    Drip OK, pills cause itching  . Zosyn [Piperacillin Sod-Tazobactam So] Itching and Other (See Comments)    flushing   Patient Measurements: Height: 5\' 2"  (157.5 cm) Weight: 145 lb 15.1 oz (66.2 kg) IBW/kg (Calculated) : 50.1 Heparin Dosing Weight:   Vital Signs: Temp: 97.6 F (36.4 C) (01/06 0550) Temp Source: Oral (01/06 0550) BP: 157/78 (01/06 0550) Pulse Rate: 96 (01/06 0550)  Labs:  Recent Labs  10/11/16 0544 10/11/16 2149 10/12/16 0534 10/13/16 0603  HGB 9.3*  --  9.0*  --   HCT 27.9*  --  26.7*  --   PLT 108*  --  97*  --   LABPROT  --  18.9* 18.4* 23.2*  INR  --  1.57 1.51 2.03  CREATININE  --   --  1.02* 0.84   Estimated Creatinine Clearance: 58.8 mL/min (by C-G formula based on SCr of 0.84 mg/dL).  Assessment: 42 yoF presented to ED from home on 10/09/16 after falling twice and complaints of hand pain/swelling/redness where she had a blood draw.  CTa shows pulmonary emboli in several proximal right upper lobe pulmonary artery branches as well as in the posterior segment right upper lobe pulmonary artery branch. Patient was started on lovenox on admission and changed to [place] on 1/3 --> to change to lovenox/warfarin on 1/4 since pt's insurance does not cover for xarelto.  Today, 10/13/2016: - day #3 of 5 days minimum of anticoagulation overlap -  1/5: cbc low and trending down (monitor closely for now)  - no bleeding reported - INR in therapeutic range at 2.03 this am (appears baseline was 1.57) - drug-drug intxns: abx may increase INR  Goal of Therapy:  INR 2-3 Anti-Xa level 0.6-1 units/ml 4hrs after LMWH dose given Monitor  platelets by anticoagulation protocol: Yes   Plan:  - Repeat Warfarin 2mg  today at 1800 - cotinue lovenox 65 mg SQ q12h while inpatient (in case another I&D or other surgical procedures are needed for infected hand).  At discharge, recommend changing to lovenox 100 mg SQ q24h (if overlap not completed as inpatient)  - monitor for s/s bleeding  1400 Addendum: planned I&D tomorrow 1/7, will hold Warfarin dose today, Anticipate 8am Lovenox dose should be held as well   Minda Ditto PharmD Pager [phone] 10/13/2016, 8:43 AM

## 2016-10-13 NOTE — Progress Notes (Signed)
S: f/u R hand s/p I&D; pt states that she pressed "a lot" of pus out of the wound last pm and this am.  O:Blood pressure (!) 157/78, pulse 96, temperature 97.6 F (36.4 C), temperature source Oral, resp. rate 20, height 5\' 2"  (1.575 m), weight 66.2 kg (145 lb 15.1 oz), SpO2 100 %.  R hand, I&D site looks good, but swelling and erythema concerning for mcpj involvement  A:s/p I&D R hand; PE on anticoagulation   P:wound anesthetized, minimal purulent drainage expressed, aspirated mcpj, no fluid;  If pt no better or worse in am will plan repeat I&D, mcpj drainage in OR, would make npo tonight just in case, may also want to hold coumadin tonight.

## 2016-10-13 NOTE — Progress Notes (Signed)
PROGRESS NOTE    [name]  [id number] DOB: 08/05/1951 DOA: 10/09/2016 PCP: Joya Gaskins, MD    Brief Narrative:  66 y.o. female with a history of anemia, chronic back pain, fibromyalgia, s/p multiple orthopedic surgeries, depression, GERD, HTN, T2DM, hypothyroidism, and lichen planus who presented to the ED for frequent falls and shortness of breath.   She reports frequent falls without preceding symptoms, described as just due to sudden weakness in her legs. She was admitted in July 2017 for this and discharged to SNF for rehabilitation. She denies LOC, but has a hard time remembering the details of these events.   She has chronic pain due to osteoarthritis in her knees and back for which she takes oxycodone and ibuprofen. Her shortness of breath has worsened over the past 2 - 3 days, is intermittent, with exertion only and is not associated with chest pain or palpitations.   On arrival she was afebrile, 100% on room air and tachycardic at 122bpm. BP 165/76. Orthostatics were attempted but she was unable to stand. ED work up showed right-sided PE's on CT. She also reported right hand pain worsening for 2 months, becoming red, swollen and painful following labwork drawn from right hand dorsum. Abscess was noted on exam and I&D'ed by EDP. Antibiotics were started and hand surgery was consulted  Assessment & Plan:   Active Problems:   Spondylolisthesis of lumbar region   Hypothyroidism   Hypertension   Anemia   GERD (gastroesophageal reflux disease)   Weakness   Fall   Pulmonary embolism ([place])   Pulmonary Emboli:  - Confirmed on CT Chest without radiographic evidence of right heart strain. - No LE DVT on dopplers, reviewed - 2d echo reviewed. Normal LVEF, unremarkable study - Remains stable currently -Patient on coumadin, therapeutic today at just over 2 -Coumadin now on hold per below  Weakness/falls:  -Seen by Dr. Krista Blue. Was at SNF following last admission.    -Unable to obtain orthostatics in ED. CT head negative. - Up with assistance - PT/OT with recommendations for home health, minimal assist noted per char review - Outpatient records reviewed. Per Dr. Krista Blue, EMG nerve conduction study in 3/17 demonstrated mild chronic L lumbar radiculopathy mainly involving left L4-5 myotomes. Patient was prescribed trileptal 150mg  qhs for muscle cramping by Dr. Krista Blue - remains stable at present  Right hand abscess:  - s/p I&D in ED - Patient on empiric ancef - Wound culture has grown staph species - Will add vancomycin to cover MRSA - Discussed with WOC. Hand assessed. - Initially appeared more swollen and erythematous - Improvement noted with addition of Vanc -Zosyn was added 1/5, however has been d/c'd secondary to rash overnight -Discussed case with hand surgeon. Recs noted with plan for possible repeat I/d, mcpj drainage in OR - cultures have demonstrated MSSA. Discussed with pharmacy. If improvement noted, consider transition to PO doxy or bactrim  Hypoglycemia: CBG 44 in ED improved with po. ?adrenal insufficiency in setting of chronic steroid use for ?lichen planus per dermatology.  - AM cortisol within normal limits - remains stable at present  Chronic low back pain/fibromyalgia/lumbar spondylolisthesis: - Continue gabapentin 600 mg by mouth 3 times a day. - Continue Flexeril 10 mg by mouth 3 times a day as needed. - Continue oxycodone 20 mg by mouth 3 times a day as needed - remains stable  Hypothyroidism: TSH wnl July 2017 - For now, will continue levothyroxine 25 g daily as tolerated.  Hypertension: Chronic, stable -  Continue home medications. No AKI. - BP currently stable. Titrate bp meds as needed  Type 2 diabetes mellitus:  - Suspect steroid-induced as she's not been on steroids lately and last HbA1c 5.5% in Aug 2017.  - glucose remains stable - Continue SSI coverage  Anemia:  - Chronic, stable. Hgb 11.2, normocytic indices  on admission. - remains stable - Will recheck CBC in AM  GERD:  - Chronic, stable. - Will continue PPI as tolerated  DVT prophylaxis: lovenox subq Code Status: full Family Communication: Pt in room Disposition Plan: Uncertain at this time  Consultants:   Dr. Lenon Curt  Procedures:     Antimicrobials: Anti-infectives    Start     Dose/Rate Route Frequency Ordered Stop   10/12/16 2200  ceFEPIme (MAXIPIME) 1 g in dextrose 5 % 50 mL IVPB     1 g 100 mL/hr over 30 Minutes Intravenous Every 12 hours 10/12/16 2021     10/12/16 1000  vancomycin (VANCOCIN) 1,250 mg in sodium chloride 0.9 % 250 mL IVPB     1,250 mg 166.7 mL/hr over 90 Minutes Intravenous Every 24 hours 10/11/16 1117     10/12/16 1000  piperacillin-tazobactam (ZOSYN) IVPB 3.375 g  Status:  Discontinued     3.375 g 12.5 mL/hr over 240 Minutes Intravenous Every 8 hours 10/12/16 0950 10/12/16 2008   10/11/16 1000  vancomycin (VANCOCIN) 1,250 mg in sodium chloride 0.9 % 250 mL IVPB  Status:  Discontinued     1,250 mg 166.7 mL/hr over 90 Minutes Intravenous Every 12 hours 10/11/16 0941 10/11/16 1117   10/10/16 0000  ceFAZolin (ANCEF) IVPB 1 g/50 mL premix  Status:  Discontinued     1 g 100 mL/hr over 30 Minutes Intravenous Every 8 hours 10/09/16 1938 10/12/16 0949   10/09/16 1630  ceFAZolin (ANCEF) IVPB 1 g/50 mL premix     1 g 100 mL/hr over 30 Minutes Intravenous  Once 10/09/16 1618 10/09/16 1758      Subjective: Complains of R hand swelling/pain, expressing purulence on her own  Objective: Vitals:   10/12/16 1126 10/12/16 2125 10/13/16 0550 10/13/16 1500  BP: (!) 161/81 (!) 150/67 (!) 157/78 (!) 158/80  Pulse: 91 95 96 91  Resp: 16 20 20 20   Temp: 98.7 F (37.1 C) 98.3 F (36.8 C) 97.6 F (36.4 C) 98.2 F (36.8 C)  TempSrc: Oral Oral Oral Oral  SpO2: 100% 99% 100% 100%  Weight:      Height:        Intake/Output Summary (Last 24 hours) at 10/13/16 1626 Last data filed at 10/13/16 1501  Gross per 24  hour  Intake              350 ml  Output                0 ml  Net              350 ml   Filed Weights   10/09/16 1105 10/09/16 2043  Weight: 65.3 kg (144 lb) 66.2 kg (145 lb 15.1 oz)    Examination:  General exam: sitting in bed, in nad Respiratory system: normal resp effort, no audible wheezing Cardiovascular system: regular rate, s1-s2 on auscultation Gastrointestinal system: soft, nondistended, pos BS Central nervous system: cn2-12 grossly intact, strength intact Extremities: no clubbing, perfused Skin: normal skin turgor, no notable skin lesions seen Psychiatry: mood normal//no visual hallucinations  Data Reviewed: I have personally reviewed following labs and imaging studies  CBC:  Recent Labs Lab 10/09/16 1213 10/10/16 0559 10/11/16 0544 10/12/16 0534  WBC 5.0 3.5* 3.8* 3.4*  NEUTROABS 4.0  --   --   --   HGB 11.2* 9.1* 9.3* 9.0*  HCT 33.3* 27.3* 27.9* 26.7*  MCV 93.0 92.5 92.4 91.4  PLT 138* 116* 108* 97*   Basic Metabolic Panel:  Recent Labs Lab 10/09/16 1213 10/10/16 0559 10/12/16 0534 10/13/16 0603  NA 138 139 142 138  K 4.2 3.6 3.2* 3.9  CL 106 108 109 107  CO2 19* 25 25 23   GLUCOSE 55* 86 94 99  BUN 20 17 15 17   CREATININE 0.89 0.97 1.02* 0.84  CALCIUM 8.5* 7.9* 8.0* 8.1*   GFR: Estimated Creatinine Clearance: 58.8 mL/min (by C-G formula based on SCr of 0.84 mg/dL). Liver Function Tests:  Recent Labs Lab 10/09/16 1213  AST 34  ALT 32  ALKPHOS 89  BILITOT 1.7*  PROT 5.3*  ALBUMIN 2.5*   No results for input(s): LIPASE, AMYLASE in the last 168 hours. No results for input(s): AMMONIA in the last 168 hours. Coagulation Profile:  Recent Labs Lab 10/11/16 2149 10/12/16 0534 10/13/16 0603  INR 1.57 1.51 2.03   Cardiac Enzymes: No results for input(s): CKTOTAL, CKMB, CKMBINDEX, TROPONINI in the last 168 hours. BNP (last 3 results) No results for input(s): PROBNP in the last 8760 hours. HbA1C: No results for input(s): HGBA1C in  the last 72 hours. CBG:  Recent Labs Lab 10/12/16 1125 10/12/16 1657 10/12/16 2123 10/13/16 0724 10/13/16 1230  GLUCAP 116* 118* 138* 102* 124*   Lipid Profile: No results for input(s): CHOL, HDL, LDLCALC, TRIG, CHOLHDL, LDLDIRECT in the last 72 hours. Thyroid Function Tests: No results for input(s): TSH, T4TOTAL, FREET4, T3FREE, THYROIDAB in the last 72 hours. Anemia Panel: No results for input(s): VITAMINB12, FOLATE, FERRITIN, TIBC, IRON, RETICCTPCT in the last 72 hours. Sepsis Labs: No results for input(s): PROCALCITON, LATICACIDVEN in the last 168 hours.  Recent Results (from the past 240 hour(s))  Aerobic Culture (superficial specimen)     Status: None   Collection Time: 10/09/16  4:09 PM  Result Value Ref Range Status   Specimen Description ABSCESS  Final   Special Requests NONE  Final   Gram Stain   Final    FEW WBC PRESENT, PREDOMINANTLY PMN FEW GRAM POSITIVE COCCI IN CLUSTERS Performed at [place]  Final   Report Status 10/12/2016 FINAL  Final   Organism ID, Bacteria STAPHYLOCOCCUS AUREUS  Final      Susceptibility   Staphylococcus aureus - MIC*    CIPROFLOXACIN <=0.5 SENSITIVE Sensitive     ERYTHROMYCIN <=0.25 SENSITIVE Sensitive     GENTAMICIN <=0.5 SENSITIVE Sensitive     OXACILLIN <=0.25 SENSITIVE Sensitive     TETRACYCLINE <=1 SENSITIVE Sensitive     VANCOMYCIN 1 SENSITIVE Sensitive     TRIMETH/SULFA <=10 SENSITIVE Sensitive     CLINDAMYCIN <=0.25 SENSITIVE Sensitive     RIFAMPIN <=0.5 SENSITIVE Sensitive     Inducible Clindamycin NEGATIVE Sensitive     * MODERATE STAPHYLOCOCCUS AUREUS     Radiology Studies: No results found.  Scheduled Meds: . ceFEPime (MAXIPIME) IV  1 g Intravenous Q12H  . enoxaparin (LOVENOX) injection  65 mg Subcutaneous Q12H  . [START ON 10/14/2016] enoxaparin (LOVENOX) injection  65 mg Subcutaneous Q12H  . gabapentin  300 mg Oral QHS  . insulin aspart  0-15 Units  Subcutaneous TID WC  . insulin aspart  0-5  Units Subcutaneous QHS  . levothyroxine  25 mcg Oral QAC breakfast  . meloxicam  7.5 mg Oral Daily  . multivitamin with minerals  1 tablet Oral Daily  . pantoprazole  80 mg Oral Daily  . pneumococcal 23 valent vaccine  0.5 mL Intramuscular Tomorrow-1000  . polyethylene glycol  17 g Oral Daily  . sodium chloride flush  3 mL Intravenous Q12H  . vancomycin  1,250 mg Intravenous Q24H  . warfarin   Does not apply Once  . Warfarin - Pharmacist Dosing Inpatient   Does not apply q1800   Continuous Infusions:   LOS: 3 days   Modene Andy, Orpah Melter, MD [facility] Pager [phone]  If 7PM-7AM, please contact night-coverage [email or web address] Password [email or web address] 10/13/2016, 4:26 PM

## 2016-10-13 NOTE — Progress Notes (Signed)
Occupational Therapy Treatment Patient Details Name: [name] MRN: [id number] DOB: 03/27/1951 Today's Date: 10/13/2016    History of present illness [name] is an 66 y.o. right handed female who presents with   Several days of increasing pain and swelling to right dorsal hand. Dr Lenon Curt performed I and D on R hand 10/10/2015.  Pt newly dx PE   OT comments  May need ST SNF - pt concerned about performing her ADL activity at home with decreased function R hand  Follow Up Recommendations  Home health OT;Supervision/Assistance - 24 hour;SNF;Other (comment) (depending on progress. decreased function R hand interfering with pts ability to perform ADL activity )    Equipment Recommendations  None recommended by OT    Recommendations for Other Services      Precautions / Restrictions Precautions Precautions: Fall Precaution Comments: R hand dressing Restrictions Weight Bearing Restrictions: No       Mobility Bed Mobility         Supine to sit: Modified independent (Device/Increase time)     General bed mobility comments: pt sitting EOB  Transfers Overall transfer level: Modified independent Equipment used: Rolling walker (2 wheeled) Transfers: Sit to/from Stand Sit to Stand: Min guard Stand pivot transfers: Min guard       General transfer comment: pt having trouble hanging onto walker with R hand this day        ADL       Grooming: Standing;Supervision/safety;Brushing hair       Lower Body Bathing: Minimal assistance;Sit to/from stand;Cueing for safety   Upper Body Dressing : Minimal assistance;Sitting   Lower Body Dressing: Minimal assistance;Sit to/from stand;Cueing for safety;Cueing for sequencing   Toilet Transfer: Supervision/safety;Ambulation;Cueing for sequencing;Cueing for safety   Toileting- Clothing Manipulation and Hygiene: Supervision/safety;Sit to/from stand;Cueing for safety;Cueing for sequencing         General ADL Comments:  Pts R hand interfering with her ability to perform her ADL activity                 Cognition   Behavior During Therapy: WFL for tasks assessed/performed Overall Cognitive Status: Within Functional Limits for tasks assessed                       Extremity/Trunk Assessment       R hand with dressing over would.  Fingers with edema and decreased AROM - mostly pain in  middle finger with flexion        Exercises Hand Exercises Digit Composite Flexion: AROM;Right;5 reps Composite Extension: AROM;5 reps Digit Composite Abduction: AROM;5 reps Digit Composite Adduction: AROM;5 reps Opposition: AAROM;5 reps;Right;Other (comment) (limited by pain- unable to oppose middle finger)   Shoulder Instructions       General Comments      Pertinent Vitals/ Pain       Pain Score: 6  Pain Location: R hand with AROM of middle finger Pain Descriptors / Indicators: Tender;Sore Pain Intervention(s): Repositioned;Limited activity within patient's tolerance;Relaxation  Home Living                                          Prior Functioning/Environment              Frequency  Min 2X/week        Progress Toward Goals  OT Goals(current goals can now be found in the care plan  section)  Progress towards OT goals: Progressing toward goals     Plan Discharge plan remains appropriate    Co-evaluation                 End of Session     Activity Tolerance Patient limited by pain (pts hand with increased pain and edema this day.)   Patient Left with call bell/phone within reach;in bed   Nurse Communication Mobility status        Time: [id number] OT Time Calculation (min): 23 min  Charges: OT General Charges $OT Visit: 1 Procedure OT Treatments $Self Care/Home Management : 8-22 mins $Therapeutic Exercise: 8-22 mins  Ricky Gallery D 10/13/2016, 4:23 PM

## 2016-10-14 ENCOUNTER — Encounter (HOSPITAL_COMMUNITY)
Admission: EM | Disposition: A | Discharge status: Home Health | Payer: Self-pay | Source: Home / Self Care | Attending: Internal Medicine

## 2016-10-14 ENCOUNTER — Inpatient Hospital Stay (HOSPITAL_COMMUNITY): Payer: Medicare Other | Admitting: Certified Registered Nurse Anesthetist

## 2016-10-14 ENCOUNTER — Encounter (HOSPITAL_COMMUNITY): Payer: Self-pay | Admitting: Certified Registered Nurse Anesthetist

## 2016-10-14 HISTORY — PX: I & D EXTREMITY: SHX5045

## 2016-10-14 LAB — CBC
HEMATOCRIT: 29.8 % — AB (ref 36.0–46.0)
Hemoglobin: 9.8 g/dL — ABNORMAL LOW (ref 12.0–15.0)
MCH: 30.3 pg (ref 26.0–34.0)
MCHC: 32.9 g/dL (ref 30.0–36.0)
MCV: 92.3 fL (ref 78.0–100.0)
PLATELETS: 105 10*3/uL — AB (ref 150–400)
RBC: 3.23 MIL/uL — ABNORMAL LOW (ref 3.87–5.11)
RDW: 14.3 % (ref 11.5–15.5)
WBC: 2.8 10*3/uL — AB (ref 4.0–10.5)

## 2016-10-14 LAB — URINALYSIS, ROUTINE W REFLEX MICROSCOPIC
BILIRUBIN URINE: NEGATIVE
Glucose, UA: 100 mg/dL — AB
HGB URINE DIPSTICK: NEGATIVE
Ketones, ur: NEGATIVE mg/dL
LEUKOCYTES UA: NEGATIVE
Nitrite: NEGATIVE
PH: 6 (ref 5.0–8.0)
Protein, ur: NEGATIVE mg/dL
Specific Gravity, Urine: 1.025 (ref 1.005–1.030)
WBC, UA: NONE SEEN WBC/hpf (ref 0–5)

## 2016-10-14 LAB — GLUCOSE, CAPILLARY
GLUCOSE-CAPILLARY: 111 mg/dL — AB (ref 65–99)
GLUCOSE-CAPILLARY: 139 mg/dL — AB (ref 65–99)
GLUCOSE-CAPILLARY: 131 mg/dL — AB (ref 65–99)
Glucose-Capillary: 207 mg/dL — ABNORMAL HIGH (ref 65–99)

## 2016-10-14 LAB — SURGICAL PCR SCREEN
MRSA, PCR: POSITIVE — AB
Staphylococcus aureus: POSITIVE — AB

## 2016-10-14 LAB — BASIC METABOLIC PANEL
ANION GAP: 4 — AB (ref 5–15)
BUN: 16 mg/dL (ref 6–20)
CO2: 26 mmol/L (ref 22–32)
Calcium: 8 mg/dL — ABNORMAL LOW (ref 8.9–10.3)
Chloride: 108 mmol/L (ref 101–111)
Creatinine, Ser: 0.68 mg/dL (ref 0.44–1.00)
GFR calc Af Amer: >60 mL/min (ref >60)
GFR calc non Af Amer: >60 mL/min (ref >60)
GLUCOSE: 103 mg/dL — AB (ref 65–99)
Potassium: 3.8 mmol/L (ref 3.5–5.1)
Sodium: 138 mmol/L (ref 135–145)

## 2016-10-14 LAB — PROTIME-INR
INR: 1.58
Prothrombin Time: 19 seconds — ABNORMAL HIGH (ref 11.4–15.2)

## 2016-10-14 SURGERY — IRRIGATION AND DEBRIDEMENT EXTREMITY
Anesthesia: Monitor Anesthesia Care | Site: Hand | Laterality: Right

## 2016-10-14 MED ORDER — MEPIVACAINE HCL 1.5 % IJ SOLN: 30.0000 mL | Freq: Once | INTRAMUSCULAR | Status: DC

## 2016-10-14 MED ORDER — TRIAMCINOLONE ACETONIDE 0.1 % EX CREA
TOPICAL_CREAM | Freq: Three times a day (TID) | CUTANEOUS | Status: DC
  Administered 2016-10-14 – 2016-10-17 (×7): via TOPICAL
  Filled 2016-10-14 (×2): qty 15

## 2016-10-14 MED ORDER — PROPOFOL 500 MG/50ML IV EMUL
INTRAVENOUS | Status: DC | PRN
  Administered 2016-10-14: 100 ug/kg/min via INTRAVENOUS

## 2016-10-14 MED ORDER — PROPOFOL 10 MG/ML IV BOLUS
INTRAVENOUS | Status: AC
  Filled 2016-10-14: qty 40

## 2016-10-14 MED ORDER — EPINEPHRINE PF 1 MG/ML IJ SOLN
INTRAMUSCULAR | Status: AC
  Filled 2016-10-14: qty 1

## 2016-10-14 MED ORDER — METOCLOPRAMIDE HCL 5 MG/ML IJ SOLN: 10.0000 mg | Freq: Once | INTRAMUSCULAR | Status: DC | PRN

## 2016-10-14 MED ORDER — PHENYLEPHRINE 40 MCG/ML (10ML) SYRINGE FOR IV PUSH (FOR BLOOD PRESSURE SUPPORT)
PREFILLED_SYRINGE | INTRAVENOUS | Status: AC
  Filled 2016-10-14: qty 10

## 2016-10-14 MED ORDER — CHLORHEXIDINE GLUCONATE CLOTH 2 % EX PADS
6.0000 | MEDICATED_PAD | Freq: Every day | CUTANEOUS | Status: DC
  Administered 2016-10-14 – 2016-10-16 (×3): 6 via TOPICAL

## 2016-10-14 MED ORDER — MIDAZOLAM HCL 2 MG/2ML IJ SOLN
INTRAMUSCULAR | Status: AC
  Filled 2016-10-14: qty 2

## 2016-10-14 MED ORDER — BUPIVACAINE HCL (PF) 0.25 % IJ SOLN
INTRAMUSCULAR | Status: DC | PRN
  Administered 2016-10-14: 5 mL

## 2016-10-14 MED ORDER — LIDOCAINE-EPINEPHRINE 2 %-1:100000 IJ SOLN
INTRAMUSCULAR | Status: DC | PRN
  Administered 2016-10-14: 30 mL via PERINEURAL

## 2016-10-14 MED ORDER — LIDOCAINE HCL (PF) 2 % IJ SOLN
INTRAMUSCULAR | Status: AC
  Filled 2016-10-14: qty 30

## 2016-10-14 MED ORDER — FENTANYL CITRATE (PF) 100 MCG/2ML IJ SOLN: 25.0000 ug | INTRAMUSCULAR | Status: DC | PRN

## 2016-10-14 MED ORDER — BUPIVACAINE HCL (PF) 0.25 % IJ SOLN
INTRAMUSCULAR | Status: AC
  Filled 2016-10-14: qty 30

## 2016-10-14 MED ORDER — FENTANYL CITRATE (PF) 100 MCG/2ML IJ SOLN
INTRAMUSCULAR | Status: AC
  Filled 2016-10-14: qty 2

## 2016-10-14 MED ORDER — MIDAZOLAM HCL 5 MG/5ML IJ SOLN
INTRAMUSCULAR | Status: DC | PRN
  Administered 2016-10-14: 2 mg via INTRAVENOUS

## 2016-10-14 MED ORDER — MUPIROCIN 2 % EX OINT
1.0000 "application " | TOPICAL_OINTMENT | Freq: Two times a day (BID) | CUTANEOUS | Status: DC
  Administered 2016-10-14 – 2016-10-17 (×8): 1 via NASAL
  Filled 2016-10-14: qty 22

## 2016-10-14 MED ORDER — LACTATED RINGERS IV SOLN: INTRAVENOUS | Status: DC

## 2016-10-14 MED ORDER — PHENYLEPHRINE 40 MCG/ML (10ML) SYRINGE FOR IV PUSH (FOR BLOOD PRESSURE SUPPORT)
PREFILLED_SYRINGE | INTRAVENOUS | Status: DC | PRN
  Administered 2016-10-14: 80 ug via INTRAVENOUS

## 2016-10-14 MED ORDER — LACTATED RINGERS IV SOLN
INTRAVENOUS | Status: DC | PRN
  Administered 2016-10-14: 08:00:00 via INTRAVENOUS

## 2016-10-14 MED ORDER — DIPHENHYDRAMINE HCL 25 MG PO CAPS
25.0000 mg | ORAL_CAPSULE | Freq: Four times a day (QID) | ORAL | Status: DC | PRN
  Administered 2016-10-14 – 2016-10-16 (×3): 25 mg via ORAL
  Filled 2016-10-14 (×3): qty 1

## 2016-10-14 MED ORDER — FENTANYL CITRATE (PF) 100 MCG/2ML IJ SOLN
INTRAMUSCULAR | Status: DC | PRN
  Administered 2016-10-14: 100 ug via INTRAVENOUS

## 2016-10-14 MED ORDER — MEPERIDINE HCL 50 MG/ML IJ SOLN: 6.2500 mg | INTRAMUSCULAR | Status: DC | PRN

## 2016-10-14 MED ORDER — WARFARIN SODIUM 2.5 MG PO TABS
2.5000 mg | ORAL_TABLET | Freq: Once | ORAL | Status: AC
Start: 2016-10-14 — End: 2016-10-14
  Administered 2016-10-14: 2.5 mg via ORAL
  Filled 2016-10-14: qty 1

## 2016-10-14 MED ORDER — 0.9 % SODIUM CHLORIDE (POUR BTL) OPTIME
TOPICAL | Status: DC | PRN
  Administered 2016-10-14: 1000 mL

## 2016-10-14 SURGICAL SUPPLY — 38 items
BAG SPEC THK2 15X12 ZIP CLS (MISCELLANEOUS) ×1
BAG ZIPLOCK 12X15 (MISCELLANEOUS) ×2 IMPLANT
BANDAGE ACE 4X5 VEL STRL LF (GAUZE/BANDAGES/DRESSINGS) ×1 IMPLANT
BNDG GAUZE ELAST 4 BULKY (GAUZE/BANDAGES/DRESSINGS) ×2 IMPLANT
CORDS BIPOLAR (ELECTRODE) ×2 IMPLANT
COVER SURGICAL LIGHT HANDLE (MISCELLANEOUS) ×2 IMPLANT
CUFF TOURN SGL QUICK 18 (TOURNIQUET CUFF) ×1 IMPLANT
CUFF TOURN SGL QUICK 24 (TOURNIQUET CUFF)
CUFF TRNQT CYL 24X4X40X1 (TOURNIQUET CUFF) IMPLANT
DRAIN PENROSE 18X1/2 LTX STRL (DRAIN) IMPLANT
DRAPE SURG 17X11 SM STRL (DRAPES) ×3 IMPLANT
DRSG XEROFORM 1X8 (GAUZE/BANDAGES/DRESSINGS) ×1 IMPLANT
ELECT REM PT RETURN 9FT ADLT (ELECTROSURGICAL) ×2
ELECTRODE REM PT RTRN 9FT ADLT (ELECTROSURGICAL) ×1 IMPLANT
GAUZE SPONGE 4X4 12PLY STRL (GAUZE/BANDAGES/DRESSINGS) ×2 IMPLANT
GLOVE BIOGEL M STRL SZ7.5 (GLOVE) ×1 IMPLANT
GLOVE BIOGEL PI IND STRL 6.5 (GLOVE) ×1 IMPLANT
GLOVE BIOGEL PI IND STRL 7.0 (GLOVE) ×1 IMPLANT
GLOVE BIOGEL PI IND STRL 7.5 (GLOVE) ×1 IMPLANT
GLOVE BIOGEL PI INDICATOR 6.5 (GLOVE) ×1
GLOVE BIOGEL PI INDICATOR 7.0 (GLOVE) ×1
GLOVE BIOGEL PI INDICATOR 7.5 (GLOVE) ×1
HANDPIECE INTERPULSE COAX TIP (DISPOSABLE)
KIT BASIN OR (CUSTOM PROCEDURE TRAY) ×2 IMPLANT
NEEDLE HYPO 22GX1.5 SAFETY (NEEDLE) ×2 IMPLANT
NS IRRIG 1000ML POUR BTL (IV SOLUTION) ×2 IMPLANT
PACK ORTHO EXTREMITY (CUSTOM PROCEDURE TRAY) ×2 IMPLANT
POSITIONER SURGICAL ARM (MISCELLANEOUS) ×2 IMPLANT
SET HNDPC FAN SPRY TIP SCT (DISPOSABLE) ×1 IMPLANT
STOCKINETTE 6  STRL (DRAPES)
STOCKINETTE 6 STRL (DRAPES) ×1 IMPLANT
SUT ETHILON 3 0 PS 1 (SUTURE) ×2 IMPLANT
SUT ETHILON 4 0 PS 2 18 (SUTURE) ×2 IMPLANT
SUT ETHILON 5 0 P 3 18 (SUTURE) ×1
SUT NYLON ETHILON 5-0 P-3 1X18 (SUTURE) IMPLANT
SWAB COLLECTION DEVICE MRSA (MISCELLANEOUS) IMPLANT
SWAB CULTURE ESWAB REG 1ML (MISCELLANEOUS) IMPLANT
SYR CONTROL 10ML LL (SYRINGE) ×2 IMPLANT

## 2016-10-14 NOTE — Anesthesia Postprocedure Evaluation (Signed)
Anesthesia Post Note  Patient: [name]  Procedure(s) Performed: Procedure(s) (LRB): IRRIGATION AND DEBRIDEMENT RIGHT HAND (Right)  Patient location during evaluation: PACU Anesthesia Type: Regional Level of consciousness: awake and alert Pain management: pain level controlled Vital Signs Assessment: post-procedure vital signs reviewed and stable Respiratory status: spontaneous breathing Cardiovascular status: stable Postop Assessment: no signs of nausea or vomiting Anesthetic complications: no       Last Vitals:  Vitals:   10/14/16 0455 10/14/16 0830  BP: (!) 154/79 133/61  Pulse: 85   Resp: 20   Temp: 36.7 C 36.5 C    Last Pain:  Vitals:   10/14/16 0610  TempSrc:   PainSc: 2                  Montez Hageman

## 2016-10-14 NOTE — Progress Notes (Signed)
PROGRESS NOTE    [name]  [id number] DOB: Feb 16, 1951 DOA: 10/09/2016 PCP: Joya Gaskins, MD    Brief Narrative:  66 y.o. female with a history of anemia, chronic back pain, fibromyalgia, s/p multiple orthopedic surgeries, depression, GERD, HTN, T2DM, hypothyroidism, and lichen planus who presented to the ED for frequent falls and shortness of breath.   She reports frequent falls without preceding symptoms, described as just due to sudden weakness in her legs. She was admitted in July 2017 for this and discharged to SNF for rehabilitation. She denies LOC, but has a hard time remembering the details of these events.   She has chronic pain due to osteoarthritis in her knees and back for which she takes oxycodone and ibuprofen. Her shortness of breath has worsened over the past 2 - 3 days, is intermittent, with exertion only and is not associated with chest pain or palpitations.   On arrival she was afebrile, 100% on room air and tachycardic at 122bpm. BP 165/76. Orthostatics were attempted but she was unable to stand. ED work up showed right-sided PE's on CT. She also reported right hand pain worsening for 2 months, becoming red, swollen and painful following labwork drawn from right hand dorsum. Abscess was noted on exam and I&D'ed by EDP. Antibiotics were started and hand surgery was consulted  Assessment & Plan:   Active Problems:   Spondylolisthesis of lumbar region   Hypothyroidism   Hypertension   Anemia   GERD (gastroesophageal reflux disease)   Weakness   Fall   Pulmonary embolism ([place])   Pulmonary Emboli:  - Confirmed on CT Chest without radiographic evidence of right heart strain. - No LE DVT on dopplers, reviewed - 2d echo reviewed. Normal LVEF, unremarkable study - Remains stable currently - Plan to resume anticoagulation when OK with hand surgery  Weakness/falls:  -Seen by Dr. Krista Blue. Was at SNF following last admission.  -Unable to obtain orthostatics  in ED. CT head negative. - Up with assistance - PT/OT with recommendations for home health, minimal assist noted per char review - Outpatient records reviewed. Per Dr. Krista Blue, EMG nerve conduction study in 3/17 demonstrated mild chronic L lumbar radiculopathy mainly involving left L4-5 myotomes. Patient was prescribed trileptal 150mg  qhs for muscle cramping by Dr. Krista Blue - presently stable  Right hand abscess:  - s/p I&D in ED - Patient on empiric ancef - Wound culture has grown staph species - Will add vancomycin to cover MRSA - Discussed with WOC. Hand assessed. - Initially appeared more swollen and erythematous - Improvement noted with addition of Vanc -Zosyn was added 1/5, however has been d/c'd secondary to rash - cultures have demonstrated MSSA. Discussed with pharmacy. If improvement noted, consider transition to PO doxy or bactrim -Pt is now s/p I/D by Hand Surgeon 10/14/16.  Hypoglycemia: CBG 44 in ED improved with po. ?adrenal insufficiency in setting of chronic steroid use for ?lichen planus per dermatology.  - AM cortisol within normal limits - Currently stable  Chronic low back pain/fibromyalgia/lumbar spondylolisthesis: - Continue gabapentin 600 mg by mouth 3 times a day. - Continue Flexeril 10 mg by mouth 3 times a day as needed. - Continue oxycodone 20 mg by mouth 3 times a day as needed - at present, stable  Hypothyroidism: TSH wnl July 2017 - Plan to continue levothyroxine 25 g   Hypertension: Chronic, stable - Continue home medications. No AKI. - BP at present stable  Type 2 diabetes mellitus:  - Suspect steroid-induced as she's  not been on steroids lately and last HbA1c 5.5% in Aug 2017.  - glucose reviewed, and remains stable -will continue ssi as needed  Anemia:  - Chronic, stable. Hgb 11.2, normocytic indices on admission. - currently stable - Repeat CBC in AM  GERD:  - Chronic, stable. - Plan to continue PPI as tolerated  DVT prophylaxis:  lovenox subq Code Status: full Family Communication: Pt in room Disposition Plan: Uncertain at this time  Consultants:   Dr. Lenon Curt  Procedures:     Antimicrobials: Anti-infectives    Start     Dose/Rate Route Frequency Ordered Stop   10/12/16 2200  ceFEPIme (MAXIPIME) 1 g in dextrose 5 % 50 mL IVPB     1 g 100 mL/hr over 30 Minutes Intravenous Every 12 hours 10/12/16 2021     10/12/16 1000  vancomycin (VANCOCIN) 1,250 mg in sodium chloride 0.9 % 250 mL IVPB     1,250 mg 166.7 mL/hr over 90 Minutes Intravenous Every 24 hours 10/11/16 1117     10/12/16 1000  piperacillin-tazobactam (ZOSYN) IVPB 3.375 g  Status:  Discontinued     3.375 g 12.5 mL/hr over 240 Minutes Intravenous Every 8 hours 10/12/16 0950 10/12/16 2008   10/11/16 1000  vancomycin (VANCOCIN) 1,250 mg in sodium chloride 0.9 % 250 mL IVPB  Status:  Discontinued     1,250 mg 166.7 mL/hr over 90 Minutes Intravenous Every 12 hours 10/11/16 0941 10/11/16 1117   10/10/16 0000  ceFAZolin (ANCEF) IVPB 1 g/50 mL premix  Status:  Discontinued     1 g 100 mL/hr over 30 Minutes Intravenous Every 8 hours 10/09/16 1938 10/12/16 0949   10/09/16 1630  ceFAZolin (ANCEF) IVPB 1 g/50 mL premix     1 g 100 mL/hr over 30 Minutes Intravenous  Once 10/09/16 1618 10/09/16 1758      Subjective: Without complaints at present  Objective: Vitals:   10/14/16 0455 10/14/16 0830 10/14/16 0900 10/14/16 0911  BP: (!) 154/79 133/61  (!) 146/83  Pulse: 85   84  Resp: 20   14  Temp: 98.1 F (36.7 C) 97.7 F (36.5 C) 98.2 F (36.8 C) 98.3 F (36.8 C)  TempSrc: Oral     SpO2: 95%   96%  Weight:      Height:        Intake/Output Summary (Last 24 hours) at 10/14/16 1534 Last data filed at 10/14/16 0815  Gross per 24 hour  Intake              540 ml  Output                5 ml  Net              535 ml   Filed Weights   10/09/16 1105 10/09/16 2043  Weight: 65.3 kg (144 lb) 66.2 kg (145 lb 15.1 oz)    Examination:  General  exam: Laying in bed, in nad, conversant Respiratory system: normal chest rise, clear, no wheezing Cardiovascular system: regular rhythm, s1, s2 Gastrointestinal system: no masses, pos BS, obese Central nervous system: no seizures, no tremors Extremities: no cyanosis, no joint deformities Skin: no acute rashes, no pallor Psychiatry: affect normal//no auditoryl hallucinations  Data Reviewed: I have personally reviewed following labs and imaging studies  CBC:  Recent Labs Lab 10/09/16 1213 10/10/16 0559 10/11/16 0544 10/12/16 0534 10/14/16 0558  WBC 5.0 3.5* 3.8* 3.4* 2.8*  NEUTROABS 4.0  --   --   --   --  HGB 11.2* 9.1* 9.3* 9.0* 9.8*  HCT 33.3* 27.3* 27.9* 26.7* 29.8*  MCV 93.0 92.5 92.4 91.4 92.3  PLT 138* 116* 108* 97* 123456*   Basic Metabolic Panel:  Recent Labs Lab 10/09/16 1213 10/10/16 0559 10/12/16 0534 10/13/16 0603 10/14/16 0558  NA 138 139 142 138 138  K 4.2 3.6 3.2* 3.9 3.8  CL 106 108 109 107 108  CO2 19* 25 25 23 26   GLUCOSE 55* 86 94 99 103*  BUN 20 17 15 17 16   CREATININE 0.89 0.97 1.02* 0.84 0.68  CALCIUM 8.5* 7.9* 8.0* 8.1* 8.0*   GFR: Estimated Creatinine Clearance: 61.7 mL/min (by C-G formula based on SCr of 0.68 mg/dL). Liver Function Tests:  Recent Labs Lab 10/09/16 1213  AST 34  ALT 32  ALKPHOS 89  BILITOT 1.7*  PROT 5.3*  ALBUMIN 2.5*   No results for input(s): LIPASE, AMYLASE in the last 168 hours. No results for input(s): AMMONIA in the last 168 hours. Coagulation Profile:  Recent Labs Lab 10/11/16 2149 10/12/16 0534 10/13/16 0603 10/14/16 0558  INR 1.57 1.51 2.03 1.58   Cardiac Enzymes: No results for input(s): CKTOTAL, CKMB, CKMBINDEX, TROPONINI in the last 168 hours. BNP (last 3 results) No results for input(s): PROBNP in the last 8760 hours. HbA1C: No results for input(s): HGBA1C in the last 72 hours. CBG:  Recent Labs Lab 10/13/16 0724 10/13/16 1230 10/13/16 2052 10/14/16 0918 10/14/16 1139  GLUCAP 102*  124* 134* 111* 139*   Lipid Profile: No results for input(s): CHOL, HDL, LDLCALC, TRIG, CHOLHDL, LDLDIRECT in the last 72 hours. Thyroid Function Tests: No results for input(s): TSH, T4TOTAL, FREET4, T3FREE, THYROIDAB in the last 72 hours. Anemia Panel: No results for input(s): VITAMINB12, FOLATE, FERRITIN, TIBC, IRON, RETICCTPCT in the last 72 hours. Sepsis Labs: No results for input(s): PROCALCITON, LATICACIDVEN in the last 168 hours.  Recent Results (from the past 240 hour(s))  Aerobic Culture (superficial specimen)     Status: None   Collection Time: 10/09/16  4:09 PM  Result Value Ref Range Status   Specimen Description ABSCESS  Final   Special Requests NONE  Final   Gram Stain   Final    FEW WBC PRESENT, PREDOMINANTLY PMN FEW GRAM POSITIVE COCCI IN CLUSTERS Performed at [facility]  Final   Report Status 10/12/2016 FINAL  Final   Organism ID, Bacteria STAPHYLOCOCCUS AUREUS  Final      Susceptibility   Staphylococcus aureus - MIC*    CIPROFLOXACIN <=0.5 SENSITIVE Sensitive     ERYTHROMYCIN <=0.25 SENSITIVE Sensitive     GENTAMICIN <=0.5 SENSITIVE Sensitive     OXACILLIN <=0.25 SENSITIVE Sensitive     TETRACYCLINE <=1 SENSITIVE Sensitive     VANCOMYCIN 1 SENSITIVE Sensitive     TRIMETH/SULFA <=10 SENSITIVE Sensitive     CLINDAMYCIN <=0.25 SENSITIVE Sensitive     RIFAMPIN <=0.5 SENSITIVE Sensitive     Inducible Clindamycin NEGATIVE Sensitive     * MODERATE STAPHYLOCOCCUS AUREUS  Surgical PCR screen     Status: Abnormal   Collection Time: 10/13/16 11:02 PM  Result Value Ref Range Status   MRSA, PCR POSITIVE (A) NEGATIVE Final    Comment: RESULT CALLED TO, READ BACK BY AND VERIFIED WITH: Glorious Peach RN [id number] 10/14/16 A NAVARRO    Staphylococcus aureus POSITIVE (A) NEGATIVE Final    Comment:        The Xpert SA Assay (FDA approved for NASAL specimens in patients  over 59 years of age), is one component of a comprehensive  surveillance program.  Test performance has been validated by [facility] for patients greater than or equal to 10 year old. It is not intended to diagnose infection nor to guide or monitor treatment.      Radiology Studies: No results found.  Scheduled Meds: . ceFEPime (MAXIPIME) IV  1 g Intravenous Q12H  . Chlorhexidine Gluconate Cloth  6 each Topical Q0600  . enoxaparin (LOVENOX) injection  65 mg Subcutaneous Q12H  . gabapentin  300 mg Oral QHS  . insulin aspart  0-15 Units Subcutaneous TID WC  . insulin aspart  0-5 Units Subcutaneous QHS  . levothyroxine  25 mcg Oral QAC breakfast  . meloxicam  7.5 mg Oral Daily  . multivitamin with minerals  1 tablet Oral Daily  . mupirocin ointment  1 application Nasal BID  . pantoprazole  80 mg Oral Daily  . pneumococcal 23 valent vaccine  0.5 mL Intramuscular Tomorrow-1000  . polyethylene glycol  17 g Oral Daily  . sodium chloride flush  3 mL Intravenous Q12H  . triamcinolone cream   Topical TID  . vancomycin  1,250 mg Intravenous Q24H  . warfarin   Does not apply Once  . Warfarin - Pharmacist Dosing Inpatient   Does not apply q1800   Continuous Infusions:   LOS: 4 days   [place] Bohac, Orpah Melter, MD [facility] Pager [phone]  If 7PM-7AM, please contact night-coverage [email or web address] Password [facility] 10/14/2016, 3:34 PM

## 2016-10-14 NOTE — Transfer of Care (Signed)
Immediate Anesthesia Transfer of Care Note  Patient: [name]  Procedure(s) Performed: Procedure(s): IRRIGATION AND DEBRIDEMENT RIGHT HAND (Right)  Patient Location: PACU  Anesthesia Type:MAC  Level of Consciousness: awake, alert  and oriented  Airway & Oxygen Therapy: Patient Spontanous Breathing and Patient connected to face mask oxygen  Post-op Assessment: Report given to RN and Post -op Vital signs reviewed and stable  Post vital signs: Reviewed and stable  Last Vitals:  Vitals:   10/13/16 2100 10/14/16 0455  BP: (!) 171/92 (!) 154/79  Pulse: 92 85  Resp: 19 20  Temp: 36.9 C 36.7 C    Last Pain:  Vitals:   10/14/16 0610  TempSrc:   PainSc: 2       Patients Stated Pain Goal: 3 (54/27/06 2376)  Complications: No apparent anesthesia complications

## 2016-10-14 NOTE — Anesthesia Preprocedure Evaluation (Signed)
Anesthesia Evaluation  Patient identified by MRN, date of birth, ID band Patient awake    Reviewed: Allergy & Precautions, NPO status , Patient's Chart, lab work & pertinent test results  History of Anesthesia Complications (+) PONV  Airway Mallampati: II  TM Distance: >3 FB Neck ROM: Full    Dental no notable dental hx.    Pulmonary neg pulmonary ROS, shortness of breath, PE   Pulmonary exam normal breath sounds clear to auscultation       Cardiovascular hypertension, Pt. on medications Normal cardiovascular exam Rhythm:Regular Rate:Normal     Neuro/Psych negative neurological ROS  negative psych ROS   GI/Hepatic negative GI ROS, Neg liver ROS,   Endo/Other  diabetes, Type 2, Oral Hypoglycemic AgentsHypothyroidism   Renal/GU negative Renal ROS  negative genitourinary   Musculoskeletal negative musculoskeletal ROS (+) Fibromyalgia -  Abdominal   Peds negative pediatric ROS (+)  Hematology Idiopathic thrombocytopenia    Anesthesia Other Findings   Reproductive/Obstetrics negative OB ROS                            Anesthesia Physical Anesthesia Plan  ASA: III  Anesthesia Plan: Regional and MAC   Post-op Pain Management:    Induction:   Airway Management Planned: Simple Face Mask  Additional Equipment:   Intra-op Plan:   Post-operative Plan:   Informed Consent: I have reviewed the patients History and Physical, chart, labs and discussed the procedure including the risks, benefits and alternatives for the proposed anesthesia with the patient or authorized representative who has indicated his/her understanding and acceptance.   Dental advisory given  Plan Discussed with: CRNA  Anesthesia Plan Comments:         Anesthesia Quick Evaluation

## 2016-10-14 NOTE — Anesthesia Procedure Notes (Signed)
Anesthesia Regional Block:  Supraclavicular block  Pre-Anesthetic Checklist: ,, timeout performed, Correct Patient, Correct Site, Correct Laterality, Correct Procedure, Correct Position, site marked, Risks and benefits discussed, Surgical consent, Pre-op evaluation  Laterality: Upper and Right  Prep: Maximum Sterile Barrier Precautions used, chloraprep       Needles:  Injection technique: Single-shot  Needle Type: Echogenic Stimulator Needle     Needle Length: 10cm 10 cm Needle Gauge: 21 G    Additional Needles:  Procedures: ultrasound guided (picture in chart) Supraclavicular block Narrative:  Start time: 10/14/2016 7:34 AM End time: 10/14/2016 7:44 AM Injection made incrementally with aspirations every 5 mL.  Performed by: Personally  Anesthesiologist: Montez Hageman  Additional Notes: Supraclavicular block as primary anesthetic.  Risks, benefits and alternative to block explained extensively.  Patient tolerated procedure well, without complications.

## 2016-10-14 NOTE — Progress Notes (Signed)
ANTICOAGULATION CONSULT NOTE - Follow Up Consult  Pharmacy Consult for xarelto --> Lovenox/warfarin Indication: new pulmonary embolus, Dopplers negative for LE VTE  Allergies  Allergen Reactions  . Formaldehyde     Other reaction(s): Shortness Of Breath, coughing  . Altace [Ramipril] Cough  . Dilaudid [Hydromorphone] Itching    Drip OK, pills cause itching  . Zosyn [Piperacillin Sod-Tazobactam So] Itching and Other (See Comments)    flushing   Patient Measurements: Height: 5\' 2"  (157.5 cm) Weight: 145 lb 15.1 oz (66.2 kg) IBW/kg (Calculated) : 50.1 Heparin Dosing Weight:   Vital Signs: Temp: 98.3 F (36.8 C) (01/07 0911) Temp Source: Oral (01/07 0455) BP: 146/83 (01/07 0911) Pulse Rate: 84 (01/07 0911)  Labs:  Recent Labs  10/12/16 0534 10/13/16 0603 10/14/16 0558  HGB 9.0*  --  9.8*  HCT 26.7*  --  29.8*  PLT 97*  --  105*  LABPROT 18.4* 23.2* 19.0*  INR 1.51 2.03 1.58  CREATININE 1.02* 0.84 0.68   Estimated Creatinine Clearance: 61.7 mL/min (by C-G formula based on SCr of 0.68 mg/dL).  Assessment: 79 yoF presented to ED from home on 10/09/16 after falling twice and complaints of hand pain/swelling/redness where she had a blood draw.  CTa shows pulmonary emboli in several proximal right upper lobe pulmonary artery branches as well as in the posterior segment right upper lobe pulmonary artery branch. Patient was started on lovenox on admission and changed to [place] on 1/3 --> to change to lovenox/warfarin on 1/4 since pt's insurance does not cover for xarelto.  Today, 10/14/2016: - day #4 of 5 days minimum of anticoagulation overlap - Lovenox held this am for I&D of hand -  1/5: cbc low and trending down (monitor closely for now)  - no bleeding reported - INR below therapeutic range at 1.58 this am (appears baseline was 1.57) - Warf held 1/6 for I&D - drug-drug intxns: abx may increase INR  Goal of Therapy:  INR 2-3 Anti-Xa level 0.6-1 units/ml 4hrs after LMWH  dose given Monitor platelets by anticoagulation protocol: Yes   Plan:  - OK to resume Warfarin by hand surgeon (phone call)  - Warfarin 2.5mg  today at 1830 - cotinue lovenox 65 mg SQ q12h while inpatient (in case another I&D or other surgical procedures are needed for infected hand).  At discharge, recommend changing to lovenox 100 mg SQ q24h (if overlap not completed as inpatient)  - monitor for s/s bleeding  Minda Ditto PharmD Pager [phone] 10/14/2016, 4:38 PM

## 2016-10-15 ENCOUNTER — Encounter (HOSPITAL_COMMUNITY): Payer: Self-pay | Admitting: General Surgery

## 2016-10-15 LAB — CBC WITH DIFFERENTIAL/PLATELET
BASOS PCT: 0 %
Basophils Absolute: 0 10*3/uL (ref 0.0–0.1)
EOS ABS: 0.1 10*3/uL (ref 0.0–0.7)
EOS PCT: 3 %
HCT: 29.7 % — ABNORMAL LOW (ref 36.0–46.0)
HEMOGLOBIN: 9.5 g/dL — AB (ref 12.0–15.0)
Lymphocytes Relative: 37 %
Lymphs Abs: 1.1 10*3/uL (ref 0.7–4.0)
MCH: 29.1 pg (ref 26.0–34.0)
MCHC: 32 g/dL (ref 30.0–36.0)
MCV: 91.1 fL (ref 78.0–100.0)
Monocytes Absolute: 0.4 10*3/uL (ref 0.1–1.0)
Monocytes Relative: 12 %
NEUTROS PCT: 47 %
Neutro Abs: 1.4 10*3/uL — ABNORMAL LOW (ref 1.7–7.7)
PLATELETS: 86 10*3/uL — AB (ref 150–400)
RBC: 3.26 MIL/uL — ABNORMAL LOW (ref 3.87–5.11)
RDW: 14 % (ref 11.5–15.5)
WBC: 2.9 10*3/uL — ABNORMAL LOW (ref 4.0–10.5)

## 2016-10-15 LAB — GLUCOSE, CAPILLARY
GLUCOSE-CAPILLARY: 121 mg/dL — AB (ref 65–99)
GLUCOSE-CAPILLARY: 98 mg/dL (ref 65–99)
GLUCOSE-CAPILLARY: 139 mg/dL — AB (ref 65–99)
Glucose-Capillary: 129 mg/dL — ABNORMAL HIGH (ref 65–99)
Glucose-Capillary: 227 mg/dL — ABNORMAL HIGH (ref 65–99)

## 2016-10-15 LAB — PROTIME-INR
INR: 1.4
PROTHROMBIN TIME: 17.3 s — AB (ref 11.4–15.2)

## 2016-10-15 MED ORDER — WARFARIN SODIUM 2.5 MG PO TABS
2.5000 mg | ORAL_TABLET | Freq: Once | ORAL | Status: AC
  Administered 2016-10-15: 2.5 mg via ORAL
  Filled 2016-10-15 (×2): qty 1

## 2016-10-15 MED ORDER — DOXYCYCLINE HYCLATE 100 MG PO TABS
100.0000 mg | ORAL_TABLET | Freq: Two times a day (BID) | ORAL | Status: DC
  Administered 2016-10-15 – 2016-10-17 (×5): 100 mg via ORAL
  Filled 2016-10-15 (×5): qty 1

## 2016-10-15 NOTE — Progress Notes (Signed)
PROGRESS NOTE    [name]  [id number] DOB: 1951-01-28 DOA: 10/09/2016 PCP: Joya Gaskins, MD    Brief Narrative:  66 y.o. female with a history of anemia, chronic back pain, fibromyalgia, s/p multiple orthopedic surgeries, depression, GERD, HTN, T2DM, hypothyroidism, and lichen planus who presented to the ED for frequent falls and shortness of breath.   She reports frequent falls without preceding symptoms, described as just due to sudden weakness in her legs. She was admitted in July 2017 for this and discharged to SNF for rehabilitation. She denies LOC, but has a hard time remembering the details of these events.   She has chronic pain due to osteoarthritis in her knees and back for which she takes oxycodone and ibuprofen. Her shortness of breath has worsened over the past 2 - 3 days, is intermittent, with exertion only and is not associated with chest pain or palpitations.   On arrival she was afebrile, 100% on room air and tachycardic at 122bpm. BP 165/76. Orthostatics were attempted but she was unable to stand. ED work up showed right-sided PE's on CT. She also reported right hand pain worsening for 2 months, becoming red, swollen and painful following labwork drawn from right hand dorsum. Abscess was noted on exam and I&D'ed by EDP. Antibiotics were started and hand surgery was consulted  Assessment & Plan:   Active Problems:   Spondylolisthesis of lumbar region   Hypothyroidism   Hypertension   Anemia   GERD (gastroesophageal reflux disease)   Weakness   Fall   Pulmonary embolism ([place])   Pulmonary Emboli:  - Confirmed on CT Chest without radiographic evidence of right heart strain. - No LE DVT on dopplers, reviewed - 2d echo reviewed. Normal LVEF, unremarkable study - Remains stable currently - Plan to resume anticoagulation when OK with hand surgery - Have resumed coumadin, pharmacy to dose  Weakness/falls:  -Seen by Dr. Krista Blue. Was at SNF following last  admission.  -Unable to obtain orthostatics in ED. CT head negative. - Up with assistance - PT/OT with recommendations for home health, minimal assist noted per char review - Outpatient records reviewed. Per Dr. Krista Blue, EMG nerve conduction study in 3/17 demonstrated mild chronic L lumbar radiculopathy mainly involving left L4-5 myotomes. Patient was prescribed trileptal 150mg  qhs for muscle cramping by Dr. Krista Blue - remains stable - Discussed with Occupational therapy. Recs for SNF  Right hand abscess:  - s/p I&D in ED - Patient on empiric ancef - Wound culture has grown staph species - Will add vancomycin to cover MRSA - Discussed with WOC. Hand assessed. - Initially appeared more swollen and erythematous - Improvement noted with addition of Vanc -Zosyn was added 1/5, however has been d/c'd secondary to rash - cultures have demonstrated MSSA. Discussed with pharmacy. If improvement noted, consider transition to PO doxy or bactrim -Pt is now s/p I/D by Hand Surgeon 10/14/16. -dressings in place  Hypoglycemia: CBG 44 in ED improved with po. ?adrenal insufficiency in setting of chronic steroid use for ?lichen planus per dermatology.  - AM cortisol within normal limits - Remains stable  Chronic low back pain/fibromyalgia/lumbar spondylolisthesis: - Continue gabapentin 600 mg by mouth 3 times a day. - Continue Flexeril 10 mg by mouth 3 times a day as needed. - Continue oxycodone 20 mg by mouth 3 times a day as needed - currently stable  Hypothyroidism: TSH wnl July 2017 - Plan to continue levothyroxine 25 g  - Presently stable  Hypertension: Chronic, stable - Continue  home medications. No AKI. - BP stable at this time  Type 2 diabetes mellitus:  - Suspect steroid-induced as she's not been on steroids lately and last HbA1c 5.5% in Aug 2017.  - glucose reviewed, and remains stable -Patient is continued on SSI coverage  Anemia:  - Chronic, stable. Hgb 11.2, normocytic indices on  admission. - currently stable - Will repeat CBC in AM  GERD:  - Chronic, stable. - Plan to continue PPI as tolerated  DVT prophylaxis: lovenox subq Code Status: full Family Communication: Pt in room Disposition Plan: Uncertain at this time  Consultants:   Dr. Lenon Curt  Procedures:     Antimicrobials: Anti-infectives    Start     Dose/Rate Route Frequency Ordered Stop   10/15/16 1000  doxycycline (VIBRA-TABS) tablet 100 mg     100 mg Oral Every 12 hours 10/15/16 0936     10/12/16 2200  ceFEPIme (MAXIPIME) 1 g in dextrose 5 % 50 mL IVPB  Status:  Discontinued     1 g 100 mL/hr over 30 Minutes Intravenous Every 12 hours 10/12/16 2021 10/15/16 0936   10/12/16 1000  vancomycin (VANCOCIN) 1,250 mg in sodium chloride 0.9 % 250 mL IVPB  Status:  Discontinued     1,250 mg 166.7 mL/hr over 90 Minutes Intravenous Every 24 hours 10/11/16 1117 10/15/16 0936   10/12/16 1000  piperacillin-tazobactam (ZOSYN) IVPB 3.375 g  Status:  Discontinued     3.375 g 12.5 mL/hr over 240 Minutes Intravenous Every 8 hours 10/12/16 0950 10/12/16 2008   10/11/16 1000  vancomycin (VANCOCIN) 1,250 mg in sodium chloride 0.9 % 250 mL IVPB  Status:  Discontinued     1,250 mg 166.7 mL/hr over 90 Minutes Intravenous Every 12 hours 10/11/16 0941 10/11/16 1117   10/10/16 0000  ceFAZolin (ANCEF) IVPB 1 g/50 mL premix  Status:  Discontinued     1 g 100 mL/hr over 30 Minutes Intravenous Every 8 hours 10/09/16 1938 10/12/16 0949   10/09/16 1630  ceFAZolin (ANCEF) IVPB 1 g/50 mL premix     1 g 100 mL/hr over 30 Minutes Intravenous  Once 10/09/16 1618 10/09/16 1758      Subjective: Presently without complaints  Objective: Vitals:   10/14/16 1530 10/14/16 2139 10/15/16 0459 10/15/16 1338  BP: (!) 158/84 (!) 160/78 (!) 149/79 (!) 150/71  Pulse: 81 90 82 90  Resp: 20  20 20   Temp: 98.5 F (36.9 C) 98.4 F (36.9 C) 98.9 F (37.2 C) 98.5 F (36.9 C)  TempSrc: Oral Oral Oral Oral  SpO2: 100% 97% 99% 97%    Weight:      Height:        Intake/Output Summary (Last 24 hours) at 10/15/16 1838 Last data filed at 10/15/16 1339  Gross per 24 hour  Intake              240 ml  Output                0 ml  Net              240 ml   Filed Weights   10/09/16 1105 10/09/16 2043  Weight: 65.3 kg (144 lb) 66.2 kg (145 lb 15.1 oz)    Examination:  General exam: Awake, conversant, in bed Respiratory system: normal resp effort, no audible wheezing Cardiovascular system: regular rate, s1-s2 on auscultation Gastrointestinal system: soft,nondistended, pos BS Central nervous system: cn2-12 grossly intact, strength intact Extremities: no clubbing, no joint deformities Skin: normal  skin turgor, no notable skin lesions seen Psychiatry: mood normal//no visual hallucinations  Data Reviewed: I have personally reviewed following labs and imaging studies  CBC:  Recent Labs Lab 10/09/16 1213 10/10/16 0559 10/11/16 0544 10/12/16 0534 10/14/16 0558 10/15/16 0827  WBC 5.0 3.5* 3.8* 3.4* 2.8* 2.9*  NEUTROABS 4.0  --   --   --   --  1.4*  HGB 11.2* 9.1* 9.3* 9.0* 9.8* 9.5*  HCT 33.3* 27.3* 27.9* 26.7* 29.8* 29.7*  MCV 93.0 92.5 92.4 91.4 92.3 91.1  PLT 138* 116* 108* 97* 105* 86*   Basic Metabolic Panel:  Recent Labs Lab 10/09/16 1213 10/10/16 0559 10/12/16 0534 10/13/16 0603 10/14/16 0558  NA 138 139 142 138 138  K 4.2 3.6 3.2* 3.9 3.8  CL 106 108 109 107 108  CO2 19* 25 25 23 26   GLUCOSE 55* 86 94 99 103*  BUN 20 17 15 17 16   CREATININE 0.89 0.97 1.02* 0.84 0.68  CALCIUM 8.5* 7.9* 8.0* 8.1* 8.0*   GFR: Estimated Creatinine Clearance: 61.7 mL/min (by C-G formula based on SCr of 0.68 mg/dL). Liver Function Tests:  Recent Labs Lab 10/09/16 1213  AST 34  ALT 32  ALKPHOS 89  BILITOT 1.7*  PROT 5.3*  ALBUMIN 2.5*   No results for input(s): LIPASE, AMYLASE in the last 168 hours. No results for input(s): AMMONIA in the last 168 hours. Coagulation Profile:  Recent Labs Lab  10/11/16 2149 10/12/16 0534 10/13/16 0603 10/14/16 0558 10/15/16 0827  INR 1.57 1.51 2.03 1.58 1.40   Cardiac Enzymes: No results for input(s): CKTOTAL, CKMB, CKMBINDEX, TROPONINI in the last 168 hours. BNP (last 3 results) No results for input(s): PROBNP in the last 8760 hours. HbA1C: No results for input(s): HGBA1C in the last 72 hours. CBG:  Recent Labs Lab 10/14/16 1707 10/14/16 2148 10/15/16 0728 10/15/16 1203 10/15/16 1633  GLUCAP 131* 207* 98 139* 129*   Lipid Profile: No results for input(s): CHOL, HDL, LDLCALC, TRIG, CHOLHDL, LDLDIRECT in the last 72 hours. Thyroid Function Tests: No results for input(s): TSH, T4TOTAL, FREET4, T3FREE, THYROIDAB in the last 72 hours. Anemia Panel: No results for input(s): VITAMINB12, FOLATE, FERRITIN, TIBC, IRON, RETICCTPCT in the last 72 hours. Sepsis Labs: No results for input(s): PROCALCITON, LATICACIDVEN in the last 168 hours.  Recent Results (from the past 240 hour(s))  Aerobic Culture (superficial specimen)     Status: None   Collection Time: 10/09/16  4:09 PM  Result Value Ref Range Status   Specimen Description ABSCESS  Final   Special Requests NONE  Final   Gram Stain   Final    FEW WBC PRESENT, PREDOMINANTLY PMN FEW GRAM POSITIVE COCCI IN CLUSTERS Performed at [facility]  Final   Report Status 10/12/2016 FINAL  Final   Organism ID, Bacteria STAPHYLOCOCCUS AUREUS  Final      Susceptibility   Staphylococcus aureus - MIC*    CIPROFLOXACIN <=0.5 SENSITIVE Sensitive     ERYTHROMYCIN <=0.25 SENSITIVE Sensitive     GENTAMICIN <=0.5 SENSITIVE Sensitive     OXACILLIN <=0.25 SENSITIVE Sensitive     TETRACYCLINE <=1 SENSITIVE Sensitive     VANCOMYCIN 1 SENSITIVE Sensitive     TRIMETH/SULFA <=10 SENSITIVE Sensitive     CLINDAMYCIN <=0.25 SENSITIVE Sensitive     RIFAMPIN <=0.5 SENSITIVE Sensitive     Inducible Clindamycin NEGATIVE Sensitive     * MODERATE  STAPHYLOCOCCUS AUREUS  Surgical PCR screen  Status: Abnormal   Collection Time: 10/13/16 11:02 PM  Result Value Ref Range Status   MRSA, PCR POSITIVE (A) NEGATIVE Final    Comment: RESULT CALLED TO, READ BACK BY AND VERIFIED WITH: Glorious Peach RN [id number] 10/14/16 A NAVARRO    Staphylococcus aureus POSITIVE (A) NEGATIVE Final    Comment:        The Xpert SA Assay (FDA approved for NASAL specimens in patients over 39 years of age), is one component of a comprehensive surveillance program.  Test performance has been validated by [facility] for patients greater than or equal to 86 year old. It is not intended to diagnose infection nor to guide or monitor treatment.      Radiology Studies: No results found.  Scheduled Meds: . Chlorhexidine Gluconate Cloth  6 each Topical Q0600  . doxycycline  100 mg Oral Q12H  . enoxaparin (LOVENOX) injection  65 mg Subcutaneous Q12H  . gabapentin  300 mg Oral QHS  . insulin aspart  0-15 Units Subcutaneous TID WC  . insulin aspart  0-5 Units Subcutaneous QHS  . levothyroxine  25 mcg Oral QAC breakfast  . meloxicam  7.5 mg Oral Daily  . multivitamin with minerals  1 tablet Oral Daily  . mupirocin ointment  1 application Nasal BID  . pantoprazole  80 mg Oral Daily  . pneumococcal 23 valent vaccine  0.5 mL Intramuscular Tomorrow-1000  . polyethylene glycol  17 g Oral Daily  . sodium chloride flush  3 mL Intravenous Q12H  . triamcinolone cream   Topical TID  . warfarin  2.5 mg Oral ONCE-1800  . warfarin   Does not apply Once  . Warfarin - Pharmacist Dosing Inpatient   Does not apply q1800   Continuous Infusions:   LOS: 5 days   Reygan Heagle, Orpah Melter, MD [facility] Pager [phone]  If 7PM-7AM, please contact night-coverage [email or web address] Password [email or web address] 10/15/2016, 6:38 PM

## 2016-10-15 NOTE — Progress Notes (Signed)
Occupational Therapy Treatment Patient Details Name: [name] MRN: [id number] DOB: [birth year] Today's Date: 10/15/2016    History of present illness [name] is an 66 y.o. right handed female who presents with   Several days of increasing pain and swelling to right dorsal hand. Dr Lenon Curt performed I and D on R hand 10/10/2015.  Pt newly dx PE; repeat I and D R hand with MCP arthrotomy 10/14/16    OT comments  Pt feels she needs SNF as her dominant hand is impaired at this time and she is unable to perform her ADL actvities. Pt has no A at home and does not feel safe going home ILy.   Pt could benefit from skilled OT at SNF to increase I with ADL activity and return to her PLOF           Precautions / Restrictions Precautions Precautions: Fall Restrictions Weight Bearing Restrictions: No       Mobility Bed Mobility Overal bed mobility: Independent       Supine to sit: Independent        Transfers Overall transfer level: Independent Equipment used: None Transfers: Sit to/from Stand Sit to Stand: Independent                  ADL                           Toilet Transfer: Modified Independent;Ambulation;Comfort height toilet   Toileting- Clothing Manipulation and Hygiene: Modified independent;Cueing for safety;Cueing for sequencing         General ADL Comments: pt very concerned about going home- feel she needs a SNF .  Explained [facility] may be an option.  OT spoke with pt regarding meal ideas for home as well as well as strategies for opening items and cutting food.  Pt does have limited use of R hand as bandage is thick at this time.  Encouraged pt try to use R hand and use fingers .  Ecouraged pt to bend and straighten fingers as able.  Pts middle finger limited at this time                Cognition   Behavior During Therapy: [facility] for tasks assessed/performed Overall Cognitive Status: Within Functional Limits for tasks assessed                                     Pertinent Vitals/ Pain       Pain Assessment: No/denies pain Pain Score: 5  Pain Location: R hand Pain Descriptors / Indicators: Throbbing Pain Intervention(s): Limited activity within patient's tolerance      Progress Toward Goals  OT Goals(current goals can now be found in the care plan section)  Progress towards OT goals: Progressing toward goals            End of Session  pt in bed with call bell          Nurse Communication  need for ST SNF- OT also called SW        Time: [id number] OT Time Calculation (min): 31 min  Charges: OT General Charges $OT Visit: 1 Procedure OT Treatments $Self Care/Home Management : 8-22 mins $Therapeutic Exercise: 8-22 mins  Dorice Stiggers D 10/15/2016, 5:37 PM

## 2016-10-15 NOTE — Op Note (Signed)
[name], [name]               ACCOUNT NO.:  [id number]  MEDICAL RECORD NO.:  [id number]  LOCATION:  [id number]                         FACILITY:  [facility]  PHYSICIAN:  Dennie Bible, MD    DATE OF BIRTH:  June 03, 1951  DATE OF PROCEDURE:  10/14/2016 DATE OF DISCHARGE:                              OPERATIVE REPORT   PREOPERATIVE DIAGNOSIS:  Abscess dorsal right hand with suspicious for metacarpophalangeal joint involvement, she is status post subcutaneous I and D 2 days ago.  POSTOPERATIVE DIAGNOSIS:  Abscess dorsal right hand with suspicious for metacarpophalangeal joint involvement, she is status post subcutaneous I and D 2 days ago.  PROCEDURES: 1. Incision and drainage of abscess of the dorsal right hand, drainage     of extensor tendon sheath. 2. Right long finger arthrotomy of the metacarpophalangeal joint with     joint washout.  ANESTHESIA:  She received an upper extremity block.  She received IV sedation.  COMPLICATIONS:  No acute complications.  SPECIMENS:  No specimens.  INDICATIONS:  [name] is a 66 year old female, presented to the ED 2 days ago with signs and symptoms of subcutaneous abscess on the dorsal right hand, this was I and D in the emergency room.  She subsequently started dressing changes and the dorsal wound progressed, however, there was persistent erythema around the metacarpophalangeal joint with pain with passive and active range of motion of this joint, suspicious for joint infection.  Risk, benefits, and alternatives of I and D, and joint aspiration and washout were discussed with the patient.  She agreed with this treatment plan.  Consent was obtained for repeat I and D.  DESCRIPTION OF PROCEDURE:  The patient was seen in the preoperative holding area.  Anesthesia provided an upper extremity block due to her comorbidities.  She was taken to the operating room, placed supine on the operating room table.  Anesthesia IV sedation was  administered.  The right upper extremity was prepped and draped in normal sterile fashion. The old I and D site was opened, there was some fibrinous exudate, but no gross pus.  Incision was carried down along the ulnar border of the metacarpophalangeal joint.  The joint capsule was incised exposing the joint.  There was some fluid in there, but there was no gross pus. Next, debridement with scissors and scalpel of the fibrinous exudate throughout the incision and wound was then performed, and an irrigation of all of the structures including the metacarpophalangeal joint was performed with a liter of saline solution.  Hemostasis was achieved with direct pressure and cautery.  Next, the wound was gently approximated with one 5-0 nylon suture.  The wound was packed with Xeroform gauze, and a large sterile bulky dressing was placed.  The patient tolerated the procedure well, was taken to the recovery room in stable condition.     Dennie Bible, MD     HCC/MEDQ  D:  10/14/2016  T:  10/15/2016  Job:  [id number]

## 2016-10-15 NOTE — Progress Notes (Signed)
Nutrition Follow-up  DOCUMENTATION CODES:   Severe malnutrition in context of chronic illness  INTERVENTION:  - Continue to encourage PO intakes of meals. - RD will continue to monitor for additional nutrition-related needs.  NUTRITION DIAGNOSIS:   Inadequate oral intake related to poor appetite, chronic illness as evidenced by per patient/family report. -improving.   GOAL:   Patient will meet greater than or equal to 90% of their needs -met on average  MONITOR:   PO intake, Weight trends, Labs, Skin, I & O's  ASSESSMENT:   66 y.o. female with a history of anemia, chronic back pain, fibromyalgia, s/p multiple orthopedic surgeries, depression, GERD, HTN, T2DM, hypothyroidism, and lichen planus who presented to the ED for frequent falls and shortness of breath. She reports frequent falls without preceding symptoms, described as just due to sudden weakness in her legs. She was admitted in July 2017 for this and discharged to SNF for rehabilitation. She has chronic pain due to osteoarthritis in her knees and back. Her shortness of breath has worsened over the past 2 - 3 days. ED work up showed right-sided PE's on CT. She also reported right hand pain worsening for 2 months, becoming red, swollen, and painful. Abscess was noted on exam and I&D'ed by EDP. Antibiotics were started and hand surgery was consulted.   1/8 Per chart review, pt mainly consuming 75-100% intakes since previous assessment. Pt was on Heart Healthy diet on 1/3 but diet was liberalized to Regular 1/4 per pt's request. No new weight since time of admission. Pt had repeat I&D of R hand abscess early this AM.  Medications reviewed; sliding scale Novolog, 25 mcg oral Synthroid/day, daily multivitamin with minerals, 80 mg oral Protonix/day, 17 g Miralax/day. Labs reviewed; Ca: 8 mg/dL yesterday; no BMET today.     1/3 - No intakes documented but pt reports she ate 1/2 baked potato for lunch.  - Unable to obtain much  nutrition-related information during this visit as pt was interested in talking about other topics as well.  - She does report that she was at [facility] over the summer and did not like the food there so she often did not eat.  - She states d/t this she lost a significant amount of weight and that appetite has not fully returned since returning home from facility.  - She reports she typically only eats 1 meal/day.  - Pt states that HgbA1 is checked biannually and often after Christmas and after Easter; pt reports that around these holidays she consumes large amounts of candy and other dessert-like items.  - At home she does not follow any type of diet.  - Unable to perform physical assessment d/t pt wishes.  - Per chart review, she has lost 11 lbs (7% body weight) in the past 3 months and 45 lbs (23% body weight) in the past 5 months which is significant for time frame.  - Pt meets criteria for severe malnutrition based on weight loss and assumed <75% needed energy intake for >/= 1 month based on pt report.      Diet Order:  Diet regular Room service appropriate? Yes; Fluid consistency: Thin  Skin:  Wound (see comment) (R hand incision from I&D on 10/09/16)  Last BM:  1/7  Height:   Ht Readings from Last 1 Encounters:  10/09/16 '5\' 2"'  (1.575 m)    Weight:   Wt Readings from Last 1 Encounters:  10/09/16 145 lb 15.1 oz (66.2 kg)    Ideal Body  Weight:  50 kg  BMI:  Body mass index is 26.69 kg/m.  Estimated Nutritional Needs:   Kcal:  1460-1655 (22-25 kcal/kg)  Protein:  55-65 grams  Fluid:  1.8 L/day  EDUCATION NEEDS:   No education needs identified at this time    Jarome Matin, MS, RD, LDN, CNSC Inpatient Clinical Dietitian Pager # [phone] After hours/weekend pager # [phone]

## 2016-10-15 NOTE — Care Management Important Message (Signed)
Important Message  Patient Details IM Letter given to Kathy/Case Manager to present to Patient Name: [name] MRN: [id number] Date of Birth: [birth year]   Medicare Important Message Given:  Yes    Kerin Salen 10/15/2016, 2:48 [place] Message  Patient Details  Name: [name] MRN: [id number] Date of Birth: November 19, 1950   Medicare Important Message Given:  Yes    Kerin Salen 10/15/2016, 2:47 PM

## 2016-10-15 NOTE — Progress Notes (Signed)
ANTICOAGULATION CONSULT NOTE - Follow Up Consult  Pharmacy Consult for Lovenox/warfarin Indication: new pulmonary embolus  Allergies  Allergen Reactions  . Formaldehyde     Other reaction(s): Shortness Of Breath, coughing  . Altace [Ramipril] Cough  . Dilaudid [Hydromorphone] Itching    Drip OK, pills cause itching  . Zosyn [Piperacillin Sod-Tazobactam So] Itching and Other (See Comments)    flushing   Patient Measurements: Height: 5\' 2"  (157.5 cm) Weight: 145 lb 15.1 oz (66.2 kg) IBW/kg (Calculated) : 50.1   Vital Signs: Temp: 98.9 F (37.2 C) (01/08 0459) Temp Source: Oral (01/08 0459) BP: 149/79 (01/08 0459) Pulse Rate: 82 (01/08 0459)  Labs:  Recent Labs  10/13/16 0603 10/14/16 0558  HGB  --  9.8*  HCT  --  29.8*  PLT  --  105*  LABPROT 23.2* 19.0*  INR 2.03 1.58  CREATININE 0.84 0.68   Estimated Creatinine Clearance: 61.7 mL/min (by C-G formula based on SCr of 0.68 mg/dL).  Assessment: 50 yoF presented to ED from home on 10/09/16 after falling twice and complaints of hand pain/swelling/redness where she had a blood draw.  CTa shows pulmonary emboli in several proximal right upper lobe pulmonary artery branches as well as in the posterior segment right upper lobe pulmonary artery branch. Patient was started on lovenox on admission and changed to [place] on 1/3.  She was changed to lovenox/warfarin on 1/4 since pt's insurance does not cover xarelto.  Today, 10/15/2016: - INR decreased to 1.4 (after holding dose on 1/6 prior to I&D) - Day #5 of 5 days minimum of anticoagulation overlap (Lovenox held 1/7 for I&D of hand) - CBC: Hgb 9.5 remains low/stable, Plt decreased to 86k (noted pancytopenia, no known cause) - No bleeding or complications reported. - Drug-drug intxns: broad spectrum abx may increase INR.     Goal of Therapy:  INR 2-3 Anti-Xa level 0.6-1 units/ml 4hrs after LMWH dose given Monitor platelets by anticoagulation protocol: Yes   Plan:  -  Warfarin 2.5mg  PO today at 1800 x1 dose - Continue Lovenox 65 mg SQ q12h - monitor for s/s bleeding   Gretta Arab PharmD, BCPS Pager [phone] 10/15/2016 8:09 AM

## 2016-10-15 NOTE — Addendum Note (Signed)
Addendum  created 10/15/16 [id number] by Lollie Sails, CRNA   Charge Capture section accepted

## 2016-10-15 NOTE — Progress Notes (Signed)
Physical Therapy Treatment Patient Details Name: [name] MRN: [id number] DOB: 1951-10-02 Today's Date: 10/15/2016    History of Present Illness [name] is an 66 y.o. right handed female who presents with   Several days of increasing pain and swelling to right dorsal hand. Dr Lenon Curt performed I and D on R hand 10/10/2015.  Pt newly dx PE; repeat I and D R hand with MCP arthrotomy 10/14/16     PT Comments    No further PT needs--D/C PT; pt is very concerned about managing transportation, household tasks d/t issues with R hand; she likely will need clarification from ortho MD regarding what she can and cannot do as far as activity R hand; will sign off  Follow Up Recommendations  No PT follow up     Equipment Recommendations  None recommended by PT    Recommendations for Other Services       Precautions / Restrictions Precautions Precautions: Fall Restrictions Weight Bearing Restrictions: No    Mobility  Bed Mobility Overal bed mobility: Independent       Supine to sit: Independent        Transfers Overall transfer level: Independent Equipment used: None Transfers: Sit to/from Stand Sit to Stand: Independent            Ambulation/Gait Ambulation/Gait assistance: Independent Ambulation Distance (Feet): 400 Feet Assistive device: None   Gait velocity: pt amb t/from bathroom with PT in room I'ly   General Gait Details: occasional drifting with head turns, no LOB including maneuvering in hallway in tighter spaces/obsta cle negotiation   Stairs            Wheelchair Mobility    Modified Rankin (Stroke Patients Only)       Balance             Standing balance-Leahy Scale: Good Standing balance comment: hx of falls but no LOB with PT;             High level balance activites: Side stepping;Backward walking;Turns;Head turns      Cognition Arousal/Alertness: Awake/alert Behavior During Therapy: WFL for tasks  assessed/performed Overall Cognitive Status: Within Functional Limits for tasks assessed                      Exercises      General Comments General comments (skin integrity, edema, etc.): encouraged pt to elevate R hand intermittently      Pertinent Vitals/Pain Pain Assessment: Faces Faces Pain Scale: Hurts little more Pain Location: R hand Pain Descriptors / Indicators: Throbbing Pain Intervention(s): Limited activity within patient's tolerance;Monitored during session    Home Living                      Prior Function            PT Goals (current goals can now be found in the care plan section) Acute Rehab PT Goals Patient Stated Goal: go home to my [place] PT Goal Formulation: With patient Time For Goal Achievement: 10/17/16 Potential to Achieve Goals: Good Progress towards PT goals: Goals met/education completed, patient discharged from PT    Frequency           PT Plan Other (comment) (D/C PT)    Co-evaluation             End of Session Equipment Utilized During Treatment: Gait belt Activity Tolerance: Patient tolerated treatment well Patient left: in bed;with call bell/phone within reach (EOB-no  alarm per nsg)     Time: 5369-2230 PT Time Calculation (min) (ACUTE ONLY): 39 min  Charges:  $Gait Training: 23-37 mins                    G Codes:      Clearence Vitug Nov 08, 2016, 10:38 AM

## 2016-10-16 LAB — BASIC METABOLIC PANEL
ANION GAP: 4 — AB (ref 5–15)
BUN: 14 mg/dL (ref 6–20)
CHLORIDE: 108 mmol/L (ref 101–111)
CO2: 26 mmol/L (ref 22–32)
Calcium: 7.9 mg/dL — ABNORMAL LOW (ref 8.9–10.3)
Creatinine, Ser: 0.59 mg/dL (ref 0.44–1.00)
GFR calc Af Amer: >60 mL/min (ref >60)
GLUCOSE: 98 mg/dL (ref 65–99)
POTASSIUM: 4 mmol/L (ref 3.5–5.1)
Sodium: 138 mmol/L (ref 135–145)

## 2016-10-16 LAB — GLUCOSE, CAPILLARY
GLUCOSE-CAPILLARY: 106 mg/dL — AB (ref 65–99)
Glucose-Capillary: 226 mg/dL — ABNORMAL HIGH (ref 65–99)
Glucose-Capillary: 158 mg/dL — ABNORMAL HIGH (ref 65–99)
Glucose-Capillary: 236 mg/dL — ABNORMAL HIGH (ref 65–99)

## 2016-10-16 LAB — CBC
HEMATOCRIT: 25.9 % — AB (ref 36.0–46.0)
HEMOGLOBIN: 8.5 g/dL — AB (ref 12.0–15.0)
MCH: 29.8 pg (ref 26.0–34.0)
MCHC: 32.8 g/dL (ref 30.0–36.0)
MCV: 90.9 fL (ref 78.0–100.0)
Platelets: 94 10*3/uL — ABNORMAL LOW (ref 150–400)
RBC: 2.85 MIL/uL — ABNORMAL LOW (ref 3.87–5.11)
RDW: 14.2 % (ref 11.5–15.5)
WBC: 2.9 10*3/uL — ABNORMAL LOW (ref 4.0–10.5)

## 2016-10-16 LAB — PROTIME-INR
INR: 1.4
Prothrombin Time: 17.3 seconds — ABNORMAL HIGH (ref 11.4–15.2)

## 2016-10-16 MED ORDER — SACCHAROMYCES BOULARDII 250 MG PO CAPS
250.0000 mg | ORAL_CAPSULE | Freq: Two times a day (BID) | ORAL | Status: DC
  Administered 2016-10-16 – 2016-10-17 (×3): 250 mg via ORAL
  Filled 2016-10-16 (×3): qty 1

## 2016-10-16 MED ORDER — WARFARIN SODIUM 4 MG PO TABS
4.0000 mg | ORAL_TABLET | Freq: Once | ORAL | Status: AC
  Administered 2016-10-16: 4 mg via ORAL
  Filled 2016-10-16: qty 1

## 2016-10-16 NOTE — Care Management Note (Signed)
Case Management Note  Patient Details  Name: TANNIE EICHMANN MRN: [id number] Date of Birth: 08-17-1951  Subjective/Objective:  Benefit checked:Per Secretary that checked benefit:Eliquis covered-co pay$44-patient in agreement(can afford)-Will provide Eliquis 30day free discount coupon. MD paged w/update on Eliquis.[facility] following for [place] orders-HHRN/aide,OT,f69f.Noted OT recc SNF-not a covered benefit alone-not covered by insurance.PT-no f/u.                 Action/Plan:d/c home w/HHC.   Expected Discharge Date:   (unknown)               Expected Discharge Plan:  [place]  In-House Referral:  NA  Discharge planning Services  CM Consult  Post Acute Care Choice:  Home Health Choice offered to:  Patient  DME Arranged:  N/A DME Agency:  NA  HH Arranged:    [place] Agency:  [facility]  Status of Service:  In process, will continue to follow  If discussed at Long Length of Stay Meetings, dates discussed:    Additional Comments:  Dessa Phi, RN 10/16/2016, 12:56 PM

## 2016-10-16 NOTE — Progress Notes (Signed)
S: hand feels better  O:Blood pressure (!) 182/89, pulse 90, temperature 99.3 F (37.4 C), temperature source Oral, resp. rate 18, height 5\' 2"  (1.575 m), weight 66.2 kg (145 lb 15.1 oz), SpO2 100 %.  Dressing changed, no pus, packing still in! - removed   A:s/p I&D R hand, mcpj   P: cont dressing changes, moist to dry 3x daily, pt may get wet in shower, wash with soap and water, repack with 2x2, then cover; f/u office in 1 wk after discharge

## 2016-10-16 NOTE — Progress Notes (Signed)
ANTICOAGULATION CONSULT NOTE - Follow Up Consult  Pharmacy Consult for Lovenox/warfarin Indication: new pulmonary embolus  Allergies  Allergen Reactions  . Formaldehyde     Other reaction(s): Shortness Of Breath, coughing  . Altace [Ramipril] Cough  . Dilaudid [Hydromorphone] Itching    Drip OK, pills cause itching  . Zosyn [Piperacillin Sod-Tazobactam So] Itching and Other (See Comments)    flushing   Patient Measurements: Height: 5\' 2"  (157.5 cm) Weight: 145 lb 15.1 oz (66.2 kg) IBW/kg (Calculated) : 50.1   Vital Signs: Temp: 98.7 F (37.1 C) (01/09 0621) Temp Source: Oral (01/09 0621) BP: 159/84 (01/09 0621) Pulse Rate: 94 (01/09 0621)  Labs:  Recent Labs  10/14/16 0558 10/15/16 0827 10/16/16 0608  HGB 9.8* 9.5* 8.5*  HCT 29.8* 29.7* 25.9*  PLT 105* 86* 94*  LABPROT 19.0* 17.3* 17.3*  INR 1.58 1.40 1.40  CREATININE 0.68  --  0.59   Estimated Creatinine Clearance: 61.7 mL/min (by C-G formula based on SCr of 0.59 mg/dL).  Assessment: 11 yoF presented to ED from home on 10/09/16 after falling twice and complaints of hand pain/swelling/redness where she had a blood draw.  CTa shows pulmonary emboli in several proximal right upper lobe pulmonary artery branches as well as in the posterior segment right upper lobe pulmonary artery branch. Patient was started on lovenox on admission and changed to [place] on 1/3.  She was changed to lovenox/warfarin on 1/4 since pt's insurance does not cover xarelto.  Today, 10/16/2016:  INR remains 1.4 (after holding dose on 1/6 prior to I&D, resumed 2.5mg  doses on 1/8 and 1/9)  CBC: Hgb decreased to 8.5, Plt improved to 94k (noted pancytopenia, no known cause)  No bleeding or complications reported.  Drug-drug intxns: broad spectrum abx may increase INR.     Goal of Therapy:  INR 2-3 Anti-Xa level 0.6-1 units/ml 4hrs after LMWH dose given Monitor platelets by anticoagulation protocol: Yes   Plan:   Warfarin boosted dose 4mg   PO today at 1200 x1 dose  Continue Lovenox 65 mg SQ q12h while INR < 2  Monitor for s/s bleeding   Gretta Arab PharmD, BCPS Pager [phone] 10/16/2016 7:32 AM

## 2016-10-16 NOTE — Progress Notes (Addendum)
PROGRESS NOTE    [name]  [id number] DOB: 1951-07-26 DOA: 10/09/2016 PCP: Joya Gaskins, MD    Brief Narrative:  66 y.o. female with a history of anemia, chronic back pain, fibromyalgia, s/p multiple orthopedic surgeries, depression, GERD, HTN, T2DM, hypothyroidism, and lichen planus who presented to the ED for frequent falls and shortness of breath.   She reports frequent falls without preceding symptoms, described as just due to sudden weakness in her legs. She was admitted in July 2017 for this and discharged to SNF for rehabilitation. She denies LOC, but has a hard time remembering the details of these events.   She has chronic pain due to osteoarthritis in her knees and back for which she takes oxycodone and ibuprofen. Her shortness of breath has worsened over the past 2 - 3 days, is intermittent, with exertion only and is not associated with chest pain or palpitations.   On arrival she was afebrile, 100% on room air and tachycardic at 122bpm. BP 165/76. Orthostatics were attempted but she was unable to stand. ED work up showed right-sided PE's on CT. She also reported right hand pain worsening for 2 months, becoming red, swollen and painful following labwork drawn from right hand dorsum. Abscess was noted on exam and I&D'ed by EDP. Antibiotics were started and hand surgery was consulted  During this hospital course, patient was continued on empiric abx that was later changed to vancomycin. Patient R hand became more swollen and patient underwent repeat I/D by hand surgeon on 1/7. Patient has been seen by OT with recommendation for SNF placement. SW has been consulted. Otherwise, patient has been continued on coumadin.  Assessment & Plan:   Active Problems:   Spondylolisthesis of lumbar region   Hypothyroidism   Hypertension   Anemia   GERD (gastroesophageal reflux disease)   Weakness   Fall   Pulmonary embolism ([place])   Pulmonary Emboli:  - Confirmed on CT  Chest without radiographic evidence of right heart strain. - No LE DVT on dopplers, reviewed - 2d echo reviewed. Normal LVEF, unremarkable study - Remains stable currently - Have continued on coumadin, pharmacy to dose  Weakness/falls:  -Seen by Dr. Krista Blue. Was at SNF following last admission.  -Unable to obtain orthostatics in ED. CT head negative. - Up with assistance - PT/OT with recommendations for home health, minimal assist noted per char review - Outpatient records reviewed. Per Dr. Krista Blue, EMG nerve conduction study in 3/17 demonstrated mild chronic L lumbar radiculopathy mainly involving left L4-5 myotomes. Patient was prescribed trileptal 150mg  qhs for muscle cramping by Dr. Krista Blue - remains stable - Discussed with Occupational therapy. Recommendations are for SNF - Have consulted SW for placement  Right hand abscess:  - s/p I&D in ED - Patient on empiric ancef - Wound culture has grown staph species - Will add vancomycin to cover MRSA - Discussed with WOC. Hand Surgeon consulted and is following. - Initially appeared more swollen and erythematous and patient underwent repeat I/D on 10/14/16 - Improvement noted with addition of Vanc. Patient has been transitioned to PO doxycycline on 1/8 -Dressings remain in place  Hypoglycemia: CBG 44 in ED improved with po. ?adrenal insufficiency in setting of chronic steroid use for ?lichen planus per dermatology.  - AM cortisol within normal limits - Glucose in the 200's. Pt is noncompliant with diet  Chronic low back pain/fibromyalgia/lumbar spondylolisthesis: - Continue gabapentin 600 mg by mouth 3 times a day. - Continue Flexeril 10 mg by mouth 3 times  a day as needed. - Continue oxycodone 20 mg by mouth 3 times a day as needed - presently stable  Hypothyroidism: TSH wnl July 2017 - Plan to continue levothyroxine 25 g  - Remains stable  Hypertension: Chronic, stable - Continue home medications. No AKI. - BP remains  stable  Type 2 diabetes mellitus:  - Suspect steroid-induced as she's not been on steroids lately and last HbA1c 5.5% in Aug 2017. -Patient is continued on SSI coverage -Overnight glucose noted to be in the 200's. Pt is noncompliant with diet  Pancytopenia:  - Chronic anemia noted, stable. Hgb 11.2, normocytic indices on admission. - Suspect secondary to acute infection - Will repeat CBC in AM   GERD:  - Chronic, stable. - will continue PPI as tolerated  DVT prophylaxis: coumadin Code Status: full Family Communication: Pt in room Disposition Plan: Uncertain at this time  Consultants:   Dr. Lenon Curt  Procedures:   I/D of R hand in ED  Repeat I/d of R hand on 10/14/16  Antimicrobials: Anti-infectives    Start     Dose/Rate Route Frequency Ordered Stop   10/15/16 1000  doxycycline (VIBRA-TABS) tablet 100 mg     100 mg Oral Every 12 hours 10/15/16 0936     10/12/16 2200  ceFEPIme (MAXIPIME) 1 g in dextrose 5 % 50 mL IVPB  Status:  Discontinued     1 g 100 mL/hr over 30 Minutes Intravenous Every 12 hours 10/12/16 2021 10/15/16 0936   10/12/16 1000  vancomycin (VANCOCIN) 1,250 mg in sodium chloride 0.9 % 250 mL IVPB  Status:  Discontinued     1,250 mg 166.7 mL/hr over 90 Minutes Intravenous Every 24 hours 10/11/16 1117 10/15/16 0936   10/12/16 1000  piperacillin-tazobactam (ZOSYN) IVPB 3.375 g  Status:  Discontinued     3.375 g 12.5 mL/hr over 240 Minutes Intravenous Every 8 hours 10/12/16 0950 10/12/16 2008   10/11/16 1000  vancomycin (VANCOCIN) 1,250 mg in sodium chloride 0.9 % 250 mL IVPB  Status:  Discontinued     1,250 mg 166.7 mL/hr over 90 Minutes Intravenous Every 12 hours 10/11/16 0941 10/11/16 1117   10/10/16 0000  ceFAZolin (ANCEF) IVPB 1 g/50 mL premix  Status:  Discontinued     1 g 100 mL/hr over 30 Minutes Intravenous Every 8 hours 10/09/16 1938 10/12/16 0949   10/09/16 1630  ceFAZolin (ANCEF) IVPB 1 g/50 mL premix     1 g 100 mL/hr over 30 Minutes  Intravenous  Once 10/09/16 1618 10/09/16 1758      Subjective: Wishing to speak with SW regarding nursing home placement  Objective: Vitals:   10/15/16 1338 10/15/16 2056 10/16/16 0621 10/16/16 1502  BP: (!) 150/71 (!) 154/78 (!) 159/84 (!) 182/89  Pulse: 90 87 94 90  Resp: 20 16 16 18   Temp: 98.5 F (36.9 C) 98.5 F (36.9 C) 98.7 F (37.1 C) 99.3 F (37.4 C)  TempSrc: Oral Oral Oral Oral  SpO2: 97% 100% 95% 100%  Weight:      Height:       No intake or output data in the 24 hours ending 10/16/16 1539 Filed Weights   10/09/16 1105 10/09/16 2043  Weight: 65.3 kg (144 lb) 66.2 kg (145 lb 15.1 oz)    Examination:  General exam: Laying in bed, in nad, conversant Respiratory system: normal rate, no audible wheezing, clear Cardiovascular system: regular rhythm, s1-s2 on auscultation Gastrointestinal system: no masses, nondistended, pos BS Central nervous system: no seizures, no  tremors Extremities: no cyanosis, perfused Skin: no pallor, no rashes Psychiatry: affect normal//no auditory hallucinations  Data Reviewed: I have personally reviewed following labs and imaging studies  CBC:  Recent Labs Lab 10/11/16 0544 10/12/16 0534 10/14/16 0558 10/15/16 0827 10/16/16 0608  WBC 3.8* 3.4* 2.8* 2.9* 2.9*  NEUTROABS  --   --   --  1.4*  --   HGB 9.3* 9.0* 9.8* 9.5* 8.5*  HCT 27.9* 26.7* 29.8* 29.7* 25.9*  MCV 92.4 91.4 92.3 91.1 90.9  PLT 108* 97* 105* 86* 94*   Basic Metabolic Panel:  Recent Labs Lab 10/10/16 0559 10/12/16 0534 10/13/16 0603 10/14/16 0558 10/16/16 0608  NA 139 142 138 138 138  K 3.6 3.2* 3.9 3.8 4.0  CL 108 109 107 108 108  CO2 25 25 23 26 26   GLUCOSE 86 94 99 103* 98  BUN 17 15 17 16 14   CREATININE 0.97 1.02* 0.84 0.68 0.59  CALCIUM 7.9* 8.0* 8.1* 8.0* 7.9*   GFR: Estimated Creatinine Clearance: 61.7 mL/min (by C-G formula based on SCr of 0.59 mg/dL). Liver Function Tests: No results for input(s): AST, ALT, ALKPHOS, BILITOT, PROT,  ALBUMIN in the last 168 hours. No results for input(s): LIPASE, AMYLASE in the last 168 hours. No results for input(s): AMMONIA in the last 168 hours. Coagulation Profile:  Recent Labs Lab 10/12/16 0534 10/13/16 0603 10/14/16 0558 10/15/16 0827 10/16/16 0608  INR 1.51 2.03 1.58 1.40 1.40   Cardiac Enzymes: No results for input(s): CKTOTAL, CKMB, CKMBINDEX, TROPONINI in the last 168 hours. BNP (last 3 results) No results for input(s): PROBNP in the last 8760 hours. HbA1C: No results for input(s): HGBA1C in the last 72 hours. CBG:  Recent Labs Lab 10/15/16 1203 10/15/16 1633 10/15/16 2054 10/16/16 0800 10/16/16 1203  GLUCAP 139* 129* 227* 226* 106*   Lipid Profile: No results for input(s): CHOL, HDL, LDLCALC, TRIG, CHOLHDL, LDLDIRECT in the last 72 hours. Thyroid Function Tests: No results for input(s): TSH, T4TOTAL, FREET4, T3FREE, THYROIDAB in the last 72 hours. Anemia Panel: No results for input(s): VITAMINB12, FOLATE, FERRITIN, TIBC, IRON, RETICCTPCT in the last 72 hours. Sepsis Labs: No results for input(s): PROCALCITON, LATICACIDVEN in the last 168 hours.  Recent Results (from the past 240 hour(s))  Aerobic Culture (superficial specimen)     Status: None   Collection Time: 10/09/16  4:09 PM  Result Value Ref Range Status   Specimen Description ABSCESS  Final   Special Requests NONE  Final   Gram Stain   Final    FEW WBC PRESENT, PREDOMINANTLY PMN FEW GRAM POSITIVE COCCI IN CLUSTERS Performed at [facility]  Final   Report Status 10/12/2016 FINAL  Final   Organism ID, Bacteria STAPHYLOCOCCUS AUREUS  Final      Susceptibility   Staphylococcus aureus - MIC*    CIPROFLOXACIN <=0.5 SENSITIVE Sensitive     ERYTHROMYCIN <=0.25 SENSITIVE Sensitive     GENTAMICIN <=0.5 SENSITIVE Sensitive     OXACILLIN <=0.25 SENSITIVE Sensitive     TETRACYCLINE <=1 SENSITIVE Sensitive     VANCOMYCIN 1 SENSITIVE Sensitive      TRIMETH/SULFA <=10 SENSITIVE Sensitive     CLINDAMYCIN <=0.25 SENSITIVE Sensitive     RIFAMPIN <=0.5 SENSITIVE Sensitive     Inducible Clindamycin NEGATIVE Sensitive     * MODERATE STAPHYLOCOCCUS AUREUS  Surgical PCR screen     Status: Abnormal   Collection Time: 10/13/16 11:02 PM  Result Value Ref Range Status  MRSA, PCR POSITIVE (A) NEGATIVE Final    Comment: RESULT CALLED TO, READ BACK BY AND VERIFIED WITH: Glorious Peach RN [id number] 10/14/16 A NAVARRO    Staphylococcus aureus POSITIVE (A) NEGATIVE Final    Comment:        The Xpert SA Assay (FDA approved for NASAL specimens in patients over 67 years of age), is one component of a comprehensive surveillance program.  Test performance has been validated by [facility] for patients greater than or equal to 57 year old. It is not intended to diagnose infection nor to guide or monitor treatment.      Radiology Studies: No results found.  Scheduled Meds: . Chlorhexidine Gluconate Cloth  6 each Topical Q0600  . doxycycline  100 mg Oral Q12H  . enoxaparin (LOVENOX) injection  65 mg Subcutaneous Q12H  . gabapentin  300 mg Oral QHS  . insulin aspart  0-15 Units Subcutaneous TID WC  . insulin aspart  0-5 Units Subcutaneous QHS  . levothyroxine  25 mcg Oral QAC breakfast  . meloxicam  7.5 mg Oral Daily  . multivitamin with minerals  1 tablet Oral Daily  . mupirocin ointment  1 application Nasal BID  . pantoprazole  80 mg Oral Daily  . pneumococcal 23 valent vaccine  0.5 mL Intramuscular Tomorrow-1000  . polyethylene glycol  17 g Oral Daily  . saccharomyces boulardii  250 mg Oral BID  . sodium chloride flush  3 mL Intravenous Q12H  . triamcinolone cream   Topical TID  . warfarin   Does not apply Once  . Warfarin - Pharmacist Dosing Inpatient   Does not apply q1800   Continuous Infusions:   LOS: 6 days   Breelle Hollywood, Orpah Melter, MD [facility] Pager [phone]  If 7PM-7AM, please contact night-coverage [email or web address] Password  [facility] 10/16/2016, 3:39 PM

## 2016-10-16 NOTE — Progress Notes (Addendum)
Patient's right hand wound is still bleeding after the packing was removed this afternoon. Another pressure dressing was put on the right hand. There is concern for the bleeding because the patient is on anticoagulants. The surgeon was notified of the situation. The patient has decided to refuse the evening dose of Lovenox.  Patient decided to have the Lovenox  later this evening.

## 2016-10-17 DIAGNOSIS — I10 Essential (primary) hypertension: Secondary | ICD-10-CM

## 2016-10-17 DIAGNOSIS — W19XXXD Unspecified fall, subsequent encounter: Secondary | ICD-10-CM

## 2016-10-17 DIAGNOSIS — E039 Hypothyroidism, unspecified: Secondary | ICD-10-CM

## 2016-10-17 DIAGNOSIS — R531 Weakness: Secondary | ICD-10-CM

## 2016-10-17 DIAGNOSIS — I2699 Other pulmonary embolism without acute cor pulmonale: Secondary | ICD-10-CM

## 2016-10-17 DIAGNOSIS — K219 Gastro-esophageal reflux disease without esophagitis: Secondary | ICD-10-CM

## 2016-10-17 DIAGNOSIS — D649 Anemia, unspecified: Secondary | ICD-10-CM

## 2016-10-17 DIAGNOSIS — M4316 Spondylolisthesis, lumbar region: Secondary | ICD-10-CM

## 2016-10-17 LAB — BASIC METABOLIC PANEL
Anion gap: 5 (ref 5–15)
BUN: 14 mg/dL (ref 6–20)
CALCIUM: 8.5 mg/dL — AB (ref 8.9–10.3)
CO2: 25 mmol/L (ref 22–32)
CREATININE: 0.66 mg/dL (ref 0.44–1.00)
Chloride: 105 mmol/L (ref 101–111)
GFR calc non Af Amer: >60 mL/min (ref >60)
GLUCOSE: 116 mg/dL — AB (ref 65–99)
Potassium: 4.2 mmol/L (ref 3.5–5.1)
Sodium: 135 mmol/L (ref 135–145)

## 2016-10-17 LAB — GLUCOSE, CAPILLARY: GLUCOSE-CAPILLARY: 261 mg/dL — AB (ref 65–99)

## 2016-10-17 LAB — PROTIME-INR
INR: 1.37
Prothrombin Time: 16.9 s — ABNORMAL HIGH (ref 11.4–15.2)

## 2016-10-17 LAB — HEMOGLOBIN AND HEMATOCRIT, BLOOD
HCT: 28.7 % — ABNORMAL LOW (ref 36.0–46.0)
Hemoglobin: 9.4 g/dL — ABNORMAL LOW (ref 12.0–15.0)

## 2016-10-17 LAB — CBC
HCT: 28.8 % — ABNORMAL LOW (ref 36.0–46.0)
Hemoglobin: 9.6 g/dL — ABNORMAL LOW (ref 12.0–15.0)
MCH: 30.4 pg (ref 26.0–34.0)
MCHC: 33.3 g/dL (ref 30.0–36.0)
MCV: 91.1 fL (ref 78.0–100.0)
PLATELETS: 148 10*3/uL — AB (ref 150–400)
RBC: 3.16 MIL/uL — AB (ref 3.87–5.11)
RDW: 14 % (ref 11.5–15.5)
WBC: 3.9 10*3/uL — ABNORMAL LOW (ref 4.0–10.5)

## 2016-10-17 MED ORDER — ADULT MULTIVITAMIN W/MINERALS CH: 1.0000 | ORAL_TABLET | Freq: Every day | ORAL | 0 refills | Status: DC

## 2016-10-17 MED ORDER — APIXABAN 5 MG PO TABS: 5.0000 mg | ORAL_TABLET | Freq: Two times a day (BID) | ORAL | Status: DC

## 2016-10-17 MED ORDER — LIDOCAINE HCL 1 % IJ SOLN: 10.0000 mL | Freq: Once | INTRAMUSCULAR | Status: DC

## 2016-10-17 MED ORDER — APIXABAN 5 MG PO TABS: ORAL_TABLET | ORAL | 0 refills | Status: DC

## 2016-10-17 MED ORDER — ACETAMINOPHEN 325 MG PO TABS
650.0000 mg | ORAL_TABLET | ORAL | Status: DC | PRN
  Administered 2016-10-17: 650 mg via ORAL
  Filled 2016-10-17: qty 2

## 2016-10-17 MED ORDER — APIXABAN 5 MG PO TABS
10.0000 mg | ORAL_TABLET | Freq: Two times a day (BID) | ORAL | Status: DC
  Administered 2016-10-17: 10 mg via ORAL
  Filled 2016-10-17: qty 2

## 2016-10-17 MED ORDER — "THROMBI-PAD 3""X3"" EX PADS"
1.0000 | MEDICATED_PAD | Freq: Once | CUTANEOUS | Status: AC
  Administered 2016-10-17: 1 via TOPICAL
  Filled 2016-10-17: qty 1

## 2016-10-17 MED ORDER — LIDOCAINE 1% INJECTION FOR CIRCUMCISION
10.0000 mL | INJECTION | Freq: Once | INTRAVENOUS | Status: DC
  Filled 2016-10-17: qty 10

## 2016-10-17 MED ORDER — DOXYCYCLINE HYCLATE 100 MG PO TABS: 100.0000 mg | ORAL_TABLET | Freq: Two times a day (BID) | ORAL | 0 refills | Status: DC

## 2016-10-17 NOTE — Progress Notes (Signed)
Late entry. I was called early this am about patients hand oozing since late yesterday afternoon.  Several dressing changes had been made and when I arrived ~3:30 the rapid response nurse was at the bedside holding pressure on the wound.  Upon evaluating the wound, there was a slow ooze from the distal wound.  2 sutures were placed with good control nad the wound was re-dressed.  I would leave this dressing on until lunch time today of this afternoon then begin cleansing the wound 2x daily with soap and water and keeping it covered.  The patient should follow up with me in 1 wk on sooner if she feels her hand is getting worse.  I have discussed these plans with the medical team.

## 2016-10-17 NOTE — Progress Notes (Signed)
Rapid Response came to assist with the patient and pressure dressings. On Call Hospitalist asked RN to call the hand surgeon again.

## 2016-10-17 NOTE — Progress Notes (Signed)
Patient's wound continues to bleed. The patient has had 7 pressure dressings since this afternoon  e

## 2016-10-17 NOTE — Progress Notes (Signed)
The answering service for Dr. Fredna Dow was notified. On call hospitalist gave staff RN their phone number to give to the hand specialist answering service.

## 2016-10-17 NOTE — Progress Notes (Signed)
Patient was seen by Dr. Lenon Curt. Sutures were placed in the right hand by the surgeon. The dressing has not to be removed until 10/18/16 @ lunchtime.

## 2016-10-17 NOTE — Progress Notes (Signed)
RN got a call back from Dr. Arvella Nigh. Dr. Lenon Curt is coming in to see the patient.

## 2016-10-17 NOTE — Progress Notes (Signed)
The hand specialist called the RN through the answering service. Asked RN to notify Dr. Arvella Nigh  RN will try again

## 2016-10-17 NOTE — Care Management Note (Signed)
Case Management Note  Patient Details  Name: [name] MRN: [id number] Date of Birth: 10-07-1951  Subjective/Objective: Informed patient of HHC(intermitten services-HHRN-to instruct her or caregiver on dsg changes-patient voiced understanding. HHRN/OT/aide ordered-[facility] rep Kim aware. Eliquis 30day free discount coupon given. No further CM needs.                   Action/Plan:d/c home w/HHC.   Expected Discharge Date:   (unknown)               Expected Discharge Plan:  [place]  In-House Referral:  NA  Discharge planning Services  CM Consult  Post Acute Care Choice:  Home Health Choice offered to:  Patient  DME Arranged:  N/A DME Agency:  NA  HH Arranged:  RN, OT, Nurse's Aide [place] Agency:  [facility]  Status of Service:  In process, will continue to follow  If discussed at Long Length of Stay Meetings, dates discussed:    Additional Comments:  Dessa Phi, RN 10/17/2016, 10:26 AM

## 2016-10-17 NOTE — Progress Notes (Signed)
ANTICOAGULATION CONSULT NOTE - Follow Up Consult  Pharmacy Consult for Apixaban Indication: pulmonary embolus  Allergies  Allergen Reactions  . Formaldehyde     Other reaction(s): Shortness Of Breath, coughing  . Altace [Ramipril] Cough  . Dilaudid [Hydromorphone] Itching    Drip OK, pills cause itching  . Zosyn [Piperacillin Sod-Tazobactam So] Itching and Other (See Comments)    flushing   Patient Measurements: Height: 5\' 2"  (157.5 cm) Weight: 147 lb 4.3 oz (66.8 kg) IBW/kg (Calculated) : 50.1   Vital Signs: Temp: 98 F (36.7 C) (01/10 0651) Temp Source: Oral (01/10 0651) BP: 151/85 (01/10 0651) Pulse Rate: 74 (01/10 0651)  Labs:  Recent Labs  10/15/16 0827 10/16/16 [id number] 10/17/16 0047 10/17/16 0626  HGB 9.5* 8.5* 9.4* 9.6*  HCT 29.7* 25.9* 28.7* 28.8*  PLT 86* 94*  --  148*  LABPROT 17.3* 17.3*  --  16.9*  INR 1.40 1.40  --  1.37  CREATININE  --  0.59  --  0.66   Estimated Creatinine Clearance: 62 mL/min (by C-G formula based on SCr of 0.66 mg/dL).  Assessment: 16 yoF presented to ED from home on 10/09/16 after falling twice and complaints of hand pain/swelling/redness where she had a blood draw.  CTa shows pulmonary emboli in several proximal right upper lobe pulmonary artery branches as well as in the posterior segment right upper lobe pulmonary artery branch. Patient was started on lovenox on admission and changed to [place] on 1/3.  She was changed to lovenox/warfarin on 1/4 since pt's insurance does not cover xarelto.  It was found that Eliquis will be covered by her insurance and she was changed to Eliquis on 1/10.  Today, 10/17/2016:  INR remains subtherapeutic and decreased at 1.37 (despite several warfarin doses).  Last Lovenox dose was given on 1/9 at 23:00  SCr 0.66, CrCl ~ 62 ml/min  CBC: Hgb improved to 9.6 (despite dressing changes) and Plt improved to 148k (noted pancytopenia, no known cause)  Hand dressing/packing was changed on 1/9 afternoon  and RN notes that bleeding continued throughout the afternoon and night.  Hand surgeon, Dr. Lenon Curt was notified and at ~ 04:30 AM on 1/10 placed sutures in the hand to stop the bleeding.  Next dressing change on 1/11.  No need to hold anticoagulation.   Goal of Therapy:  Monitor platelets by anticoagulation protocol: Yes   Plan:   Apixaban 10 mg BID x7 days, then reduce to 5mg  BID.  Pharmacy to provide apixaban education prior to discharge.  Monitor for s/s bleeding   Gretta Arab PharmD, BCPS Pager [phone] 10/17/2016 9:20 AM

## 2016-10-17 NOTE — Progress Notes (Signed)
Occupational Therapy Treatment Patient Details Name: [name] MRN: [id number] DOB: October 10, 1950 Today's Date: 10/17/2016    History of present illness [name] is an 66 y.o. right handed female who presents with   Several days of increasing pain and swelling to right dorsal hand. Dr Lenon Curt performed I and D on R hand 10/10/2015.  Pt newly dx PE; repeat I and D R hand with MCP arthrotomy 10/14/16    OT comments  OT session focused on 1 handed techniques with meal prep and ADL activity.  Pt very nervous about going home alone. Pt will benefit from [facility] and nursing for dressing changes.  Pt is concerned about this since this is her dominant hand  Follow Up Recommendations  Home health OT;Supervision/Assistance - 24 hour;SNF;Other (comment) (depending on progress. decreased function R hand interfering with pts ability to perform ADL activity )    Equipment Recommendations  None recommended by OT    Recommendations for Other Services      Precautions / Restrictions Precautions Precaution Comments: R hand dressing       Mobility Bed Mobility Overal bed mobility: Independent       Supine to sit: Independent        Transfers Overall transfer level: Independent Equipment used: None Transfers: Sit to/from Stand Sit to Stand: Independent                  ADL                           Toilet Transfer: Modified Independent;Ambulation;Comfort height toilet   Toileting- Clothing Manipulation and Hygiene: Modified independent;Cueing for safety;Cueing for sequencing         General ADL Comments: pt plans to go home this day and is very nervous although OT did discuss one handed techniques- provided pt with a rocker knife and a reacher to A with ADL activity                 Cognition   Behavior During Therapy: WFL for tasks assessed/performed Overall Cognitive Status: Within Functional Limits for tasks assessed                          Exercises  unable due to new dressing on hand   Shoulder Instructions       General Comments      Pertinent Vitals/ Pain       Pain Score: 3  Pain Location: r hand Pain Descriptors / Indicators: Sore Pain Intervention(s): Monitored during session  Home Living                                          Prior Functioning/Environment              Frequency  Min 2X/week        Progress Toward Goals  OT Goals(current goals can now be found in the care plan section)  Progress towards OT goals: Progressing toward goals     Plan Discharge plan remains appropriate    Co-evaluation                 End of Session     Activity Tolerance Patient tolerated treatment well (pts hand with increased pain and edema this day.)   Patient Left with call bell/phone within  reach;in bed   Nurse Communication Mobility status        Time: 1030-1055 OT Time Calculation (min): 25 min  Charges: OT General Charges $OT Visit: 1 Procedure OT Treatments $Self Care/Home Management : 23-37 mins  Arianni Gallego, Thereasa Parkin 10/17/2016, 12:06 PM

## 2016-10-17 NOTE — Progress Notes (Signed)
Hospitalistt  on call gave new orders, Also suggested for the RN to call the Hand Specialist.The Hand specialist was notified and RN is waiting for a return call. The contact number where the hand specialist can be reached is: [phone].

## 2016-10-17 NOTE — Care Management Note (Signed)
Case Management Note  Patient Details  Name: [name] MRN: [id number] Date of Birth: Mar 18, 1951  Subjective/Objective: Provided patient w/pcp listings as resource for pcp. Per patient request-[facility](they provide wound care services) left vm w/call back tel# for patient to receive call for wound care appt set up. Patient is very familiar w/wound care @ [facility].I have contacted wound care clinic here @ [place]-they agree if I left a vm-the rehab center will call me back. D/c home w/HHC. No further CM needs.                   Action/Plan:d/c home w/HHC.   Expected Discharge Date:   (unknown)               Expected Discharge Plan:  [place]  In-House Referral:  NA  Discharge planning Services  CM Consult  Post Acute Care Choice:  Home Health Choice offered to:  Patient  DME Arranged:  N/A DME Agency:  NA  HH Arranged:  RN, OT, Nurse's Aide [place] Agency:  [place]  Status of Service:  In process, will continue to follow  If discussed at Long Length of Stay Meetings, dates discussed:    Additional Comments:  Dessa Phi, RN 10/17/2016, 12:30 PM

## 2016-10-17 NOTE — Discharge Summary (Addendum)
Physician Discharge Summary  [name] [id number] DOB: [birth year] DOA: 10/09/2016  PCP: Joya Gaskins, MD  Admit date: 10/09/2016 Discharge date: 10/17/2016  Time spent: 45 minutes  Recommendations for Outpatient Follow-up:  Patient will be discharged to home with home health RN, aide, occupational therapy.  Patient will need to follow up with primary care provider within one week of discharge, repeat CBC.  Follow up with Dr. Lenon Curt, hand surgeon, in one week. Patient should continue medications as prescribed.  Patient should follow a heart healthy diet/carb modified.   Discharge Diagnoses:  Pulmonary Emboli Weakness/falls Right hand abscess Hypoglycemia Chronic low back pain/fibromyalgia/lumbar spondylolisthesis Hypothyroidism Hypertension Type 2 diabetes mellitus Pancytopenia GERD  Discharge Condition: Stable  Diet recommendation: heart healthy/ carb modified  Filed Weights   10/09/16 1105 10/09/16 2043 10/17/16 0651  Weight: 65.3 kg (144 lb) 66.2 kg (145 lb 15.1 oz) 66.8 kg (147 lb 4.3 oz)    History of present illness:  On 10/09/2016 by Dr. Lily Lovings is a 66 y.o. female with a history of anemia, chronic back pain, fibromyalgia, s/p multiple orthopedic surgeries, depression, GERD, HTN, T2DM, hypothyroidism, and lichen planus who presented to the ED for frequent falls and shortness of breath.   She reports frequent falls without preceding symptoms, described as just due to sudden weakness in her legs. She was admitted in July 2017 for this and discharged to SNF for rehabilitation. She denies LOC, but has a hard time remembering the details of these events.   She has chronic pain due to osteoarthritis in her knees and back for which she takes oxycodone and ibuprofen. Her shortness of breath has worsened over the past 2 - 3 days, is intermittent, with exertion only and is not associated with chest pain or palpitations.   On arrival she was  afebrile, 100% on room air and tachycardic at 122bpm. BP 165/76. Orthostatics were attempted but she was unable to stand. ED work up showed right-sided PE's on CT. She also reported right hand pain worsening for 2 months, becoming red, swollen and painful following labwork drawn from right hand dorsum. Abscess was noted on exam and I&D'ed by EDP. Antibiotics were started and hand surgery was consulted.   Hospital Course:  Pulmonary Emboli -Confirmed on CT Chest without radiographic evidence of right heart strain. -No LE DVT on dopplers, reviewed -2d echo reviewed. Normal LVEF, unremarkable study -Remains stable currently -Patient initially started on Coumadin however does have coverage for Eliquis. We'll start Eliquis today.  Weakness/falls -Seen by Dr. Krista Blue. Was at SNF following last admission.  -Unable to obtain orthostatics in ED. CT head negative. -Up with assistance -PT/OT with recommendations for home health OT only, minimal assist noted per char review -Outpatient records reviewed. Per Dr. Krista Blue, EMG nerve conduction study in 3/17 demonstrated mild chronic L lumbar radiculopathy mainly involving left L4-5 myotomes. Patient was prescribed trileptal 150mg  qhs for muscle cramping by Dr. Krista Blue -remains stable -Discussed with Occupational therapy. Recommendations are for SNF -Patient will be discharge with home health OT, aide, RN  Right hand abscess -s/p I&D in ED -Patient on empiric ancef -Wound culture has grown staph species -Vancomycin was added to cover MRSA -Discussed with WOC.  -Dr. Lenon Curt, Hand Surgeon consulted and appreciated -Initially appeared more swollen and erythematous and patient underwent repeat I/D on 10/14/16 -Improvement noted with addition of Vanc. Patient has been transitioned to PO doxycycline on 1/8 -Dressings remain in place- to be replaced 3 times per day.  -Follow  up with Dr. Lenon Curt in one week.  Hypoglycemia -CBG 44 in ED improved with po. ?adrenal  insufficiency in setting of chronic steroid use for ?lichen planus per dermatology.  -AM cortisol within normal limits -Glucose in the 200's. Pt is noncompliant with diet  Chronic low back pain/fibromyalgia/lumbar spondylolisthesis -Continue gabapentin 600 mg by mouth 3 times a day. -Continue Flexeril 10 mg by mouth 3 times a day as needed. -Continue oxycodone 20 mg by mouth 3 times a day as needed -presently stable  Hypothyroidism -TSH wnl July 2017 -Plan to continue levothyroxine 25 g  -Remains stable  Hypertension Chronic, stable -Continue home medications. No AKI. -BP remains stable  Type 2 diabetes mellitus -Suspect steroid-induced as she's not been on steroids lately and last HbA1c 5.5% in Aug 2017. -was placed on SSI coverage during hospitalization  -Overnight glucose noted to be in the 200's. Pt is noncompliant with diet -Continue glipizide at discharge  Pancytopenia -Chronic anemia noted, stable. Hgb 11.2, normocytic indices on admission. -Suspect secondary to acute infection -Repeat CBC in one week  GERD -Chronic, stable. -will continue PPI as tolerated  History of lower extremity cellulitis -Patient would like to be referred to wound care -Patient has no open would or drainage -May benefit from compression wrap? -Case management consulted  Addendum Severe malnutrition -Nutrition consulted, continue recommendations and supplements  Procedures: I&D of the right hand in ED, and on 10/14/16 by Dr. Lenon Curt  Consultations: Dr. Lenon Curt, hand surgery  Discharge Exam: Vitals:   10/16/16 2115 10/17/16 0651  BP: 138/74 (!) 151/85  Pulse: 93 74  Resp: 18 18  Temp: 99 F (37.2 C) 98 F (36.7 C)     General: Well developed, well nourished, NAD, appears stated age  HEENT: NCAT,  mucous membranes moist.  Cardiovascular: S1 S2 auscultated, no rubs, murmurs or gallops. Regular rate and rhythm.  Respiratory: Clear to auscultation bilaterally with equal  chest rise  Abdomen: Soft, nontender, nondistended, + bowel sounds  Extremities: warm dry without cyanosis clubbing. LE edema. Right hand wrapped  Neuro: AAOx3, nonfocal   Psych: Normal affect and demeanor with intact judgement and insight  Discharge Instructions Discharge Instructions    Discharge instructions    Complete by:  As directed    Patient will be discharged to home with home health RN, aide, occupational therapy.  Patient will need to follow up with primary care provider within one week of discharge, repeat CBC.  Follow up with Dr. Lenon Curt, hand surgeon, in one week. Patient should continue medications as prescribed.  Patient should follow a heart healthy diet/carb modified.     Current Discharge Medication List    START taking these medications   Details  apixaban (ELIQUIS) 5 MG TABS tablet Use 2 tablets twice per day for 7 days (through 10/24/2016).  Then start 1 tablet twice daily thereafter. Qty: 70 tablet, Refills: 0    doxycycline (VIBRA-TABS) 100 MG tablet Take 1 tablet (100 mg total) by mouth every 12 (twelve) hours. Qty: 10 tablet, Refills: 0    Multiple Vitamin (MULTIVITAMIN WITH MINERALS) TABS tablet Take 1 tablet by mouth daily. Qty: 30 tablet, Refills: 0      CONTINUE these medications which have NOT CHANGED   Details  cyclobenzaprine (FLEXERIL) 10 MG tablet Take 10 mg by mouth 3 (three) times daily as needed for muscle spasms.    estradiol (ESTRACE) 1 MG tablet Take 1 mg by mouth daily.     gabapentin (NEURONTIN) 300 MG capsule Take 1 capsule (  300 mg total) by mouth at bedtime. Qty: 30 capsule, Refills: 0    ibuprofen (ADVIL,MOTRIN) 200 MG tablet Take 800 mg by mouth every 6 (six) hours as needed (pain).    levothyroxine (SYNTHROID, LEVOTHROID) 25 MCG tablet Take 25 mcg by mouth daily before breakfast.    meloxicam (MOBIC) 7.5 MG tablet Take 7.5 mg by mouth daily. Refills: 1    omeprazole (PRILOSEC) 20 MG capsule Take 1 capsule (20 mg total) by  mouth 2 (two) times daily before a meal. Qty: 60 capsule, Refills: 6    Oxycodone HCl 20 MG TABS Take 1 tablet (20 mg total) by mouth 3 (three) times daily as needed (for pain). Qty: 90 tablet, Refills: 0    promethazine (PHENERGAN) 25 MG tablet Take 25 mg by mouth every 6 (six) hours as needed for nausea or vomiting.    saccharomyces boulardii (FLORASTOR) 250 MG capsule Take 250 mg by mouth 2 (two) times daily.    temazepam (RESTORIL) 30 MG capsule Take 30 mg by mouth at bedtime. Refills: 1    triamcinolone (NASACORT ALLERGY 24HR) 55 MCG/ACT AERO nasal inhaler Place 2 sprays into the nose daily as needed (for congestion).    Vitamin D, Ergocalciferol, (DRISDOL) 50000 units CAPS capsule Take 50,000 Units by mouth once a week.       Allergies  Allergen Reactions  . Formaldehyde     Other reaction(s): Shortness Of Breath, coughing  . Altace [Ramipril] Cough  . Dilaudid [Hydromorphone] Itching    Drip OK, pills cause itching  . Zosyn [Piperacillin Sod-Tazobactam So] Itching and Other (See Comments)    flushing   Follow-up Information    [facility] Follow up.   Why:  This is your choice of home health agency They have been contacted to provided services for you as ordered [facility] nursing,occupational therapy,aide Contact information: [address] [place] [address] [phone]        Dennie Bible, MD. Schedule an appointment as soon as possible for a visit in 1 week(s).   Specialty:  General Surgery Contact information: [address] [place] [address] [phone]        Joya Gaskins, MD. Schedule an appointment as soon as possible for a visit in 1 week(s).   Specialty:  Family Medicine Why:  Hospital follow up Contact information: [place] [address] [phone]        [facility] Follow up.   Why:  rehab center for wound care-[address] [place] [phone] call to  set up appt. Contact information: [address] [phone]           The results of significant diagnostics from this hospitalization (including imaging, microbiology, ancillary and laboratory) are listed below for reference.    Significant Diagnostic Studies: Dg Chest 2 View  Result Date: 10/09/2016 CLINICAL DATA:  Tachycardia EXAM: CHEST  2 VIEW COMPARISON:  05/03/2016 FINDINGS: Normal heart size and mediastinal contours. There is no edema, consolidation, effusion, or pneumothorax. Remote anterior left fifth and sixth rib fractures. IMPRESSION: No active cardiopulmonary disease. Electronically Signed   By: Monte Fantasia M.D.   On: 10/09/2016 12:29   Ct Head Wo Contrast  Result Date: 10/09/2016 CLINICAL DATA:  Per EMS, pt from home, reports fell x 2 this am, the second time pt is with AMS, does not recall the previous fall and does not remember them coming to check on  her when she fell the first time. pt. C/o headaches EXAM: CT HEAD WITHOUT CONTRAST TECHNIQUE: Contiguous axial images were obtained from the base of the skull through the vertex without intravenous contrast. COMPARISON:  CT head dated 05/03/2016. FINDINGS: Brain: All areas of the brain demonstrate normal gray-white matter attenuation. There is no mass, hemorrhage, edema or other evidence of acute parenchymal abnormality. No extra-axial hemorrhage. Ventricles are within normal limits in size and configuration. Vascular: There are chronic calcified atherosclerotic changes of the large vessels at the skull base. No unexpected hyperdense vessel. Skull: Normal. Negative for fracture or focal lesion. Sinuses/Orbits: No acute finding. Other: None. IMPRESSION: No acute findings.  No intracranial mass, hemorrhage or edema. Electronically Signed   By: Franki Cabot M.D.   On: 10/09/2016 14:25   Ct Angio Chest Pe W/cm &/or Wo Cm  Result Date: 10/09/2016 CLINICAL DATA:  Syncope with intermittent  shortness of breath EXAM: CT ANGIOGRAPHY CHEST WITH CONTRAST TECHNIQUE: Multidetector CT imaging of the chest was performed using the standard protocol during bolus administration of intravenous contrast. Multiplanar CT image reconstructions and MIPs were obtained to evaluate the vascular anatomy. CONTRAST:  70 mL Isovue 370 nonionic note pulmonary emboli on the left are noted. COMPARISON:  Chest radiograph October 09, 2016 FINDINGS: Cardiovascular: There are pulmonary emboli arising from the distal most aspect of the right main pulmonary artery extending into several right lower lobe pulmonary artery branches. Eighty pulmonary embolus is also noted in the proximal aspect of the posterior segment right upper lobe pulmonary artery. The right ventricle to left ventricle diameter ratio is less than 0.9, not indicative of right heart strain. There is no demonstrable thoracic aortic aneurysm or dissection. The visualized great vessels appear unremarkable. Note that the right and left common carotid arteries arise as a common trunk, an anatomic variant. There is atherosclerotic calcification in the aorta as well as in several regions of coronary artery. Pericardium is not thickened. Mediastinum/Nodes: Thyroid appears unremarkable. There are subcentimeter mediastinal lymph nodes but no adenopathy evident by size criteria. There is a focal hiatal hernia. Lungs/Pleura: On axial slice 49 series 7, there is a 3 mm nodular opacity in the anterior segment of the right upper lobe. On axial slice 71 series 7, there is a 4 mm nodular opacity in the anterior segment of the left lower lobe. On axial slice 56 series 4, there is a 5 x 2 mm nodular opacity abutting the pleura in the medial segment of the right middle lobe. There is no appreciable edema or consolidation. There is mild left base atelectatic change. There is no pleural effusion or pleural thickening. Upper Abdomen: There is hepatic steatosis. There is cholelithiasis. There  is atherosclerotic calcification in the upper abdominal aorta and major branch vessels. Musculoskeletal: There are no blastic or lytic bone lesions. Review of the MIP images confirms the above findings. IMPRESSION: Pulmonary emboli in several proximal right upper lobe pulmonary artery branches as well as in the posterior segment right upper lobe pulmonary artery branch. Findings do not meet criteria for right heart strain. Multifocal atherosclerosis including foci of coronary artery calcification. Small nodular opacities, largest measuring 4 mm. No follow-up needed if patient is low-risk (and has no known or suspected primary neoplasm). Non-contrast chest CT can be considered in 12 months if patient is high-risk. This recommendation follows the consensus statement: Guidelines for Management of Incidental Pulmonary Nodules Detected on CT Images: From the Fleischner Society 2017; Radiology 2017; 284:228-243. Hiatal hernia present.  Cholelithiasis.  Hepatic steatosis. No evident adenopathy. Critical Value/emergent results were called by telephone at the time of interpretation on 10/09/2016 at 3:30 pm to Dr. Quintella Reichert , who verbally acknowledged these results. Electronically Signed   By: Lowella Grip III M.D.   On: 10/09/2016 15:31   Ct Lumbar Spine Wo Contrast  Result Date: 09/17/2016 CLINICAL DATA:  Low back pain and left leg pain. EXAM: CT LUMBAR SPINE WITHOUT CONTRAST TECHNIQUE: Multidetector CT imaging of the lumbar spine was performed without intravenous contrast administration. Multiplanar CT image reconstructions were also generated. COMPARISON:  Lumbar spine myelogram 01/17/2015 FINDINGS: Segmentation: 5 lumbar type vertebrae. Alignment: No static subluxation. Vertebrae: There is posterior spinal fusion hardware extending from L2-L5 with bilateral spinal rods and transpedicular screws. There bilateral laminectomies at L3 and L4. There is disc spacer material at L2-L3, L3-L4 and L4-L5. There is mild  lucency surrounding the left L2 screw and the left L5 screw. No abnormal lucency about any of the other hardware. Paraspinal and other soft tissues: Atherosclerotic calcification within the abdominal aorta. Disc levels: T12-L1: Moderate facet hypertrophy. No disc bulge. No spinal canal or neural foraminal stenosis. L1-L2: Partially calcified left subarticular disc protrusion narrowing the left lateral recess, unchanged. No neural foraminal stenosis. L2-L3: Disc spacer without abnormal subsidence. No anterior osseous fusion. Posterior decompression without residual spinal canal stenosis. No foraminal stenosis. L3-L4: No abnormal spacer subsidence. Partial osseous fusion anteriorly. Posterior decompression without residual spinal canal stenosis. Moderate narrowing of the left neural foramen, primarily due to endplate osteophyte at L3, unchanged. L4-L5: Mild spacer subsidence. No anterior osseous fusion. No spinal canal stenosis. Mild foraminal narrowing. L5-S1:  No spinal canal or neural foraminal stenosis. IMPRESSION: 1. Minimal lucency surrounding the left transpedicular screws at L2 and L5 could indicate a small degree of loosening. Otherwise normal appearance of L2-L5 posterior spinal fusion. 2. No residual spinal canal stenosis. 3. Unchanged moderate left L3-L4 neural foraminal stenosis, largely caused by an inferior L2-L3 endplate osteophyte. 4. Unchanged partially calcified left subarticular L1-L2 disc protrusion with associated narrowing of the left lateral recess. Electronically Signed   By: Ulyses Jarred M.D.   On: 09/17/2016 15:41   Dg Hand Complete Right  Result Date: 10/09/2016 CLINICAL DATA:  66 year old female with history of blood draw in early November 2017 with hematoma over the second third metacarpal phalangeal joints. Recent pain in this area over the past 2-3 days. EXAM: RIGHT HAND - COMPLETE 3+ VIEW COMPARISON:  No priors. FINDINGS: Three views of the right hand demonstrate extensive soft  tissue swelling dorsal to the MCP joints. No retained radiopaque foreign body in the underlying soft tissues. No acute displaced fracture, subluxation or dislocation. No aggressive appearing lytic or blastic osseous lesions. IMPRESSION: 1. Extensive soft tissue swelling dorsal to the MCP joints without evidence of underlying acute bony abnormality. No retained radiopaque foreign body. Electronically Signed   By: Vinnie Langton M.D.   On: 10/09/2016 12:22    Microbiology: Recent Results (from the past 240 hour(s))  Aerobic Culture (superficial specimen)     Status: None   Collection Time: 10/09/16  4:09 PM  Result Value Ref Range Status   Specimen Description ABSCESS  Final   Special Requests NONE  Final   Gram Stain   Final    FEW WBC PRESENT, PREDOMINANTLY PMN FEW GRAM POSITIVE COCCI IN CLUSTERS Performed at [facility]  Final   Report Status 10/12/2016 FINAL  Final   Organism ID,  Bacteria STAPHYLOCOCCUS AUREUS  Final      Susceptibility   Staphylococcus aureus - MIC*    CIPROFLOXACIN <=0.5 SENSITIVE Sensitive     ERYTHROMYCIN <=0.25 SENSITIVE Sensitive     GENTAMICIN <=0.5 SENSITIVE Sensitive     OXACILLIN <=0.25 SENSITIVE Sensitive     TETRACYCLINE <=1 SENSITIVE Sensitive     VANCOMYCIN 1 SENSITIVE Sensitive     TRIMETH/SULFA <=10 SENSITIVE Sensitive     CLINDAMYCIN <=0.25 SENSITIVE Sensitive     RIFAMPIN <=0.5 SENSITIVE Sensitive     Inducible Clindamycin NEGATIVE Sensitive     * MODERATE STAPHYLOCOCCUS AUREUS  Surgical PCR screen     Status: Abnormal   Collection Time: 10/13/16 11:02 PM  Result Value Ref Range Status   MRSA, PCR POSITIVE (A) NEGATIVE Final    Comment: RESULT CALLED TO, READ BACK BY AND VERIFIED WITH: Glorious Peach RN [id number] 10/14/16 A NAVARRO    Staphylococcus aureus POSITIVE (A) NEGATIVE Final    Comment:        The Xpert SA Assay (FDA approved for NASAL specimens in patients over 2 years of age), is one  component of a comprehensive surveillance program.  Test performance has been validated by [facility] for patients greater than or equal to 49 year old. It is not intended to diagnose infection nor to guide or monitor treatment.      Labs: Basic Metabolic Panel:  Recent Labs Lab 10/12/16 0534 10/13/16 0603 10/14/16 0558 10/16/16 [id number] 10/17/16 0626  NA 142 138 138 138 135  K 3.2* 3.9 3.8 4.0 4.2  CL 109 107 108 108 105  CO2 25 23 26 26 25   GLUCOSE 94 99 103* 98 116*  BUN 15 17 16 14 14   CREATININE 1.02* 0.84 0.68 0.59 0.66  CALCIUM 8.0* 8.1* 8.0* 7.9* 8.5*   Liver Function Tests: No results for input(s): AST, ALT, ALKPHOS, BILITOT, PROT, ALBUMIN in the last 168 hours. No results for input(s): LIPASE, AMYLASE in the last 168 hours. No results for input(s): AMMONIA in the last 168 hours. CBC:  Recent Labs Lab 10/12/16 0534 10/14/16 0558 10/15/16 0827 10/16/16 0608 10/17/16 0047 10/17/16 0626  WBC 3.4* 2.8* 2.9* 2.9*  --  3.9*  NEUTROABS  --   --  1.4*  --   --   --   HGB 9.0* 9.8* 9.5* 8.5* 9.4* 9.6*  HCT 26.7* 29.8* 29.7* 25.9* 28.7* 28.8*  MCV 91.4 92.3 91.1 90.9  --  91.1  PLT 97* 105* 86* 94*  --  148*   Cardiac Enzymes: No results for input(s): CKTOTAL, CKMB, CKMBINDEX, TROPONINI in the last 168 hours. BNP: BNP (last 3 results) No results for input(s): BNP in the last 8760 hours.  ProBNP (last 3 results) No results for input(s): PROBNP in the last 8760 hours.  CBG:  Recent Labs Lab 10/16/16 0800 10/16/16 1203 10/16/16 1712 10/16/16 2113 10/17/16 0753  GLUCAP 226* 106* 158* 236* 261*       Signed:  Daruis Swaim  [facility] 10/17/2016, 1:15 PM

## 2016-10-17 NOTE — Progress Notes (Signed)
Rapid Response still with the patient placing another pressure dressing.  RN notified Dr.Harrill Lenon Curt; and Dr. Fredna Dow (hand specialist)

## 2016-10-17 NOTE — Progress Notes (Signed)
The Hospitalist on call came to the room to see the patient. "Combat Gauze" was applied. Another dressing was applied and the hand was elevated.

## 2016-10-22 ENCOUNTER — Telehealth: Payer: Self-pay | Admitting: Neurology

## 2016-10-22 NOTE — Telephone Encounter (Signed)
Pt called said she is falling, she has been in the hospital since 1/2, now is blood thinners and she is concerned about falling. Says Dr Cleatrice Burke advised her to see Dr Krista Blue. Pt would like to see Dr Krista Blue.  Please call (h) [phone]  (c) [phone] to work pt in

## 2016-10-22 NOTE — Telephone Encounter (Signed)
Patient is still having gait difficulty.  She was evaluated by Dr. Kathyrn Sheriff and surgery was not recommended.  It was suggested she come back to Dr. Krista Blue for further evaluation.  A follow up appt has been scheduled for her.    She has had a recent hospitalization for blood clots in her lungs and placed on blood thinners, which has caused her more concern over having falls.

## 2016-11-13 ENCOUNTER — Telehealth: Payer: Self-pay | Admitting: *Deleted

## 2016-11-13 NOTE — Telephone Encounter (Signed)
Spoke to patient - she requested earlier appt - moved to an available time on 11/15/16.

## 2016-11-15 ENCOUNTER — Ambulatory Visit (INDEPENDENT_AMBULATORY_CARE_PROVIDER_SITE_OTHER): Payer: Medicare Other | Admitting: Neurology

## 2016-11-15 ENCOUNTER — Encounter: Payer: Self-pay | Admitting: Neurology

## 2016-11-15 VITALS — BP 151/86 | HR 97 | Ht 62.0 in | Wt 152.5 lb

## 2016-11-15 DIAGNOSIS — E669 Obesity, unspecified: Secondary | ICD-10-CM | POA: Diagnosis not present

## 2016-11-15 DIAGNOSIS — E1169 Type 2 diabetes mellitus with other specified complication: Secondary | ICD-10-CM | POA: Diagnosis not present

## 2016-11-15 DIAGNOSIS — I2699 Other pulmonary embolism without acute cor pulmonale: Secondary | ICD-10-CM | POA: Diagnosis not present

## 2016-11-15 DIAGNOSIS — E039 Hypothyroidism, unspecified: Secondary | ICD-10-CM | POA: Diagnosis not present

## 2016-11-15 NOTE — Progress Notes (Signed)
Chief Complaint  Patient presents with  . Gait Abnormality    She has recently been in the hospital for blood clots.  She has increased concerns about her unsteady gait and risk for falling since being placed on Eliquis.  . Cellulitis    She is still going to wound care.      PATIENT: [name] DOB: Sep 07, 1951  Chief Complaint  Patient presents with  . Gait Abnormality    She has recently been in the hospital for blood clots.  She has increased concerns about her unsteady gait and risk for falling since being placed on Eliquis.  . Cellulitis    She is still going to wound care.     HISTORICAL  [name] is a 66 years old right-handed female, seen in refer by her primary care physician Dr.Janice Theola Sequin  for evaluation of one episode of sudden onset left-sided weakness, difficulty texting, Initial evaluation was in November 21st 2016  She had a history of hypertension, diabetes since 2012, hyperlipidemia, past history of depression, kidney stone, lumbar decompression surgery in May 2016 by neurosurgeon Dr. Joya Salm, bilateral knee replacements, She had cervical decompression surgery in 2009, by Dr. Joya Salm, prior to surgery, she had neck pain, shooting pain to her spine,   In July 25 2015, while his she was texting using her phone,, she noticed she typed the wrong letters, also noticed mild left arm weakness, when she called her aunt, she was noticed to have slurred speech, her aunt was a Equities trader, who came by to check on the patient, noticed that she was mildly weak on the left arm and leg, mild slurred speech, initial weakness last about 30 minutes, she felt tired, took a nap,  Since the incident, she continue complains of fatigue,          She also complains of gradual onset mild memory trouble, missing her routine lunch appointment with her family, tends to take the note Laboratory evaluation showed normal B12, TSH,  UPDATE Nov 09 2015: Her left side  numbness weakness is getting better. She still has right lumbar radiculopathy, radiating pain has improved post lumbar decompression surgery in May 2016, getting worse after bearing weight.  She has frequent bilateral legs, left hand muscle spasm, she does have neck pain, hx of cervical fusion in the past.  She has tried valium, flexeril, without helping her symptoms  MRI of the brain in December 2016 that was normal, MRI of lumbar in 2015 prior to lumbar decompression surgery in May 2016, multi-level degenerative disc disease, most severe at L4-5, L3 and 4, with moderate to severe bilateral foraminal stenosis, mild central canal stenosis  Echocardiogram was within normal limit, ultrasound of carotid artery showed no significant abnormality  Laboratory evaluation, normal CMP, glucose 106 mild elevated, creatinine was 1.0, normal CBC, with hemoglobin of 10 point 2, mild decrease, magnesium 1.8 T1 was within normal limits 7, folic acid 8.9 123456 more than 1700, A1c was 5.4, normal thyroid functional task, TSH, cholesterol 166 LDL 23 HDL was 133, normal iron panel  Update Feb 16 2016: EMG nerve conduction study March 2017: Showed evidence of mild chronic left lumbar radiculopathy mainly involving left L4-5 myotomes. There is no evidence of right upper extremity neuropathy or right cervical radiculopathy.  She complains of chronic low back pain neck pain, radiating pain to bilateral upper extremity, had a history of cervical decompression surgery in 2009 by Dr. Joya Salm after heavy lifting, radiating pain to her  spine sounds like cervical myelopathy  Trileptal 150 mg every night has helped her muscle cramping  UPDATE Sept 25th 2017: She fell twice on April 30 2016, fell at her closet at evening time, she denied confusion, no loss of consciousness, but could not reach her phone asking for help until 3 days later on May 03 2016, eventually she was able to knock over a table get to her phone to call 911, she  was admitted to the hospital, was found to have hyponatremia, hyperkalemia, acute renal failure, metabolic derangements, suspected that patient presented with a component of adrenal insufficiency with abrupt discontinuation of the steroid, she was on tapering dose of steroid by her dermatologist for skin rash at her back, with a diagnosis of lichen planus, she was discharged to [facility] for 2 weeks, now she is back home, she lives alone.  She has lost 33 Lbs in 6 weeks, she now complains of worsening balance, she also complains of neck pain,[place] signs,  Increased bilateral lower extremity swelling, rashes,   She has worsening bilateral lower extremity paresthesia, worsening chronic constipation, worsening urinary urgency, occasionally incontinence, we have personally reviewed MRI cervical spine May 2017: Multilevel degenerative changes, evidence of previous fusion anterior fusion 123456, T1 with metallic hardware  1. At C3-4: disc bulging and uncovertebral joint hypertrophy with moderate spinal stenosis and severe biforaminal stenosis. 2. At At C4-5, C5-6, C7-T1: uncovertebral joint hypertrophy with severe biforaminal stenosis. 3. At [id number]: metallic artifact projecting posteriorly into the spinal canal; uncovertebral joint hypertrophy and facet hypertrophy with severe biforaminal stenosis.  Reviewed laboratory evaluations, A1c 5.5, CPK was elevated 545, normal CBC with hemoglobin of 11 point 5, upon admission on May 03 2016, potassium 6.0, glucose 146,sodium 129, chloride 18, GFR 17, normal 123456 [id number], folic acid [id number], ferritin 48, decreased iron 21  UPDATE Feb 8th 2018: I reviewed and summarized hospital admission in January 2018, she was admitted for pulmonary emboli, she presented with frequent fall, worsening shortness of breath, she also had right hand abscess, was treated with I&D, vancomycin, discharged on by mouth doxycycline,  I personally reviewed CT head without  contrast in January 2018 that was normal, CT angiogram for PE protocol showed pulmonary emboli in several proximal right upper lobe pulmonary arterial branch, as well in the posterior segment of right upper lobe pulmonary arterial branch,  She was seen by Dr .Benetta Spar, and [facility] for evaluation of abnormal cervical spine, both seemed she is not a surgical candidate,    She complains of intermittent falling episodes, on further questioning,  she denies significant gait abnormality, but sometimes she has sudden onset unwarning falling, she described one episode shortly after a big lunch, she was standing talking with her aunt, she fell without warning signs, there was one episode of after watching TV for a few hours, she got up bending over then fell forward.   Today there was moderate orthostatic blood pressure changes, lying down blood pressure 148/70, heart rate of 90, standing up 129/87 heart rate of 110.  She had gestational diabetes, over the past few years, also developed abnormal glucose, on diet only, she has been taking Lyrica for fibromyalgia, denies significant lower extremity paresthesia, she does endorse occasionally orthostatic dizziness   REVIEW OF SYSTEMS: Full 14 system review of systems performed and notable only for frequent falling  ALLERGIES: Allergies  Allergen Reactions  . Formaldehyde     Other reaction(s): Shortness Of Breath, coughing  . Altace [Ramipril] Cough  . Dilaudid [  Hydromorphone] Itching    Drip OK, pills cause itching  . Zosyn [Piperacillin Sod-Tazobactam So] Itching and Other (See Comments)    flushing    HOME MEDICATIONS: Current Outpatient Prescriptions  Medication Sig Dispense Refill  . apixaban (ELIQUIS) 5 MG TABS tablet Use 2 tablets twice per day for 7 days (through 10/24/2016).  Then start 1 tablet twice daily thereafter. 70 tablet 0  . cyclobenzaprine (FLEXERIL) 10 MG tablet Take 10 mg by mouth 3 (three) times daily as needed for muscle spasms.     Marland Kitchen estradiol (ESTRACE) 1 MG tablet Take 1 mg by mouth daily.     Marland Kitchen gabapentin (NEURONTIN) 300 MG capsule Take 1 capsule (300 mg total) by mouth at bedtime. (Patient taking differently: Take 600 mg by mouth 3 (three) times daily. ) 30 capsule 0  . levothyroxine (SYNTHROID, LEVOTHROID) 25 MCG tablet Take 25 mcg by mouth daily before breakfast.    . omeprazole (PRILOSEC) 20 MG capsule Take 1 capsule (20 mg total) by mouth 2 (two) times daily before a meal. 60 capsule 6  . Oxycodone HCl 20 MG TABS Take 1 tablet (20 mg total) by mouth 3 (three) times daily as needed (for pain). 90 tablet 0  . promethazine (PHENERGAN) 25 MG tablet Take 25 mg by mouth every 6 (six) hours as needed for nausea or vomiting.    . temazepam (RESTORIL) 30 MG capsule Take 30 mg by mouth at bedtime.  1  . triamcinolone (NASACORT ALLERGY 24HR) 55 MCG/ACT AERO nasal inhaler Place 2 sprays into the nose daily as needed (for congestion).    . Vitamin D, Ergocalciferol, (DRISDOL) 50000 units CAPS capsule Take 50,000 Units by mouth once a week.     Current Facility-Administered Medications  Medication Dose Route Frequency Provider Last Rate Last Dose  . glipiZIDE (GLUCOTROL) tablet 2.5 mg  2.5 mg Oral BID AC Reyne Dumas, MD        PAST MEDICAL HISTORY: Past Medical History:  Diagnosis Date  . Anemia   . Cataracts, bilateral   . Chronic back pain    scoliosis/displacement of lumbosacral intervertebral disc/stenosis  . Depression    but doens't take any meds  . Diabetes mellitus without complication ([place])    takes Metformin daily  . Family history of adverse reaction to anesthesia    pts mom would get short of breath after anesthesia but was a smoker  . Fibromyalgia    takes Gabapentin daily  . Fusion of lumbar spine   . GERD (gastroesophageal reflux disease)    takes Nexium daily  . Heart murmur   . Hemorrhoids   . History of blood transfusion 2013   no abnormal reaction noted  . History of bronchitis 2009  .  History of colon polyps    benign  . History of hiatal hernia   . History of kidney stones    has a kidney stone now  . History of migraine    hasn't had one in over a yr  . History of surgery on arm    plates and screws  . Hypertension   . Hypothyroidism    takes Synthroid daily  . Idiopathic thrombocytopenia ([place])   . Insomnia    takes Restoril nightly as needed   . Memory loss   . Muscle spasm    takes Flexeril daily as needed  . Numbness and tingling    both toes  . PONV (postoperative nausea and vomiting)   . Shortness of breath dyspnea  with exertion but states its bc she can't exercise  . Urinary frequency   . Vitamin D deficiency     PAST SURGICAL HISTORY: Past Surgical History:  Procedure Laterality Date  . ABDOMINAL HYSTERECTOMY    . CARDIAC CATHETERIZATION  54yrs ago  . CATARACT EXTRACTION, BILATERAL    . CERVICAL FUSION    . COLONOSCOPY    . ESOPHAGOGASTRODUODENOSCOPY    . GASTRIC BYPASS    . I&D EXTREMITY Right 10/14/2016   Procedure: IRRIGATION AND DEBRIDEMENT RIGHT HAND;  Surgeon: Dayna Barker, MD;  Location: [facility];  Service: Plastics;  Laterality: Right;  . JOINT REPLACEMENT Bilateral   . kidney stone removed    . KNEE ARTHROSCOPY     total of 7 between both knees  . left arm surgery     pins  . LUMBAR FUSION    . TONSILLECTOMY    . tummy tuck       FAMILY HISTORY: Family History  Problem Relation Age of Onset  . Lung cancer Mother   . COPD Mother   . Hypertension Mother   . Depression Mother   . Lymphoma Paternal Uncle   . Diabetes Maternal Grandmother     x 5 uncles  . Heart disease      Maternal and Paternal    SOCIAL HISTORY:  Social History   Social History  . Marital status: Single    Spouse name: N/A  . Number of children: 1  . Years of education: 29   Occupational History  . Retired Therapist, sports / Disabled    Social History Main Topics  . Smoking status: Never Smoker  . Smokeless tobacco: Never Used  . Alcohol use No  .  Drug use: No  . Sexual activity: Not on file   Other Topics Concern  . Not on file   Social History Narrative   Lives at home alone.   Right handed.   Pot of coffee each morning.     PHYSICAL EXAM   Vitals:   11/15/16 1308  BP: (!) 151/86  Pulse: 97  Weight: 152 lb 8 oz (69.2 kg)  Height: 5\' 2"  (1.575 m)    Not recorded    Lying down blood pressure 148/72, heart rate of 90, standing up blood pressure 129/87 heart rate of 110, standing for 5 minutes, blood pressure 145/74 heart rate of 99.  Body mass index is 27.89 kg/m.  PHYSICAL EXAMNIATION:  Gen: NAD, conversant, well nourised, obese, well groomed                     Cardiovascular: Regular rate rhythm, no peripheral edema, warm, nontender. Eyes: Conjunctivae clear without exudates or hemorrhage Neck: Supple, no carotid bruise. Pulmonary: Clear to auscultation bilaterally   NEUROLOGICAL EXAM:  MENTAL STATUS: Speech:    Speech is normal; fluent and spontaneous with normal comprehension.  Cognition: Mini-Mental Status Examination is 29 out of 30, animal naming is 16,     Orientation to time, place and person     recent and remote memory: She missed one out of 3 recalls      Normal Attention span and concentration     Normal Language, naming, repeating,spontaneous speech     Fund of knowledge   CRANIAL NERVES: CN II: Visual fields are full to confrontation. Fundoscopic exam is normal with sharp discs and no vascular changes. Pupils are round equal and briskly reactive to light. CN III, IV, VI: extraocular movement are normal. No ptosis.  CN V: Facial sensation is intact to pinprick in all 3 divisions bilaterally. Corneal responses are intact.  CN VII: Face is symmetric with normal eye closure and smile. CN VIII: Hearing is normal to rubbing fingers CN IX, X: Palate elevates symmetrically. Phonation is normal. CN XI: Head turning and shoulder shrug are intact CN XII: Tongue is midline with normal movements and no  atrophy.  MOTOR: She has moderate bilateral hip flexion weakness  REFLEXES: Reflexes are 1 and symmetric at the biceps, triceps, knees, and ankles. Plantar responses are flexor.  SENSORY: Mildly dense dependent light touch, pinprick,  and vibration sensation at toes   COORDINATION: Rapid alternating movements and fine finger movements are intact. There is no dysmetria on finger-to-nose and heel-knee-shin.    GAIT/STANCE: She needs push up to get up from seated position, cautious, stiff gait   DIAGNOSTIC DATA (LABS, IMAGING, TESTING) - I reviewed patient records, labs, notes, testing and imaging myself where available.   ASSESSMENT AND PLAN  [name] is a 66 y.o. female  Lumbar radiculopathy frequent bilateral lower extremity muscle cramping  Keep Trileptal 150 mg half to 1 tablet every night, may consider tapering off gabapentin 600 mg 3 times a day.  Continue moderate exercise  moderate to severe canal stenosis at C6-7, moderate stenosis at C3-4  No evidence of cervical myelopathy on examination  Frequent falling   Likely related to orthostatic blood pressure changes, also long-standing history of abnormal glucose level.  I advised her increase water intake, countertraction maneuver,  Taper off gabapentin  Frequent blood pressure check  Marcial Pacas, M.D. Ph.D.  [facility] [address], [place], [place] [address] Ph: [phone] Fax: [phone]  CC: Referring Provider

## 2016-11-20 ENCOUNTER — Ambulatory Visit: Payer: Self-pay | Admitting: Neurology

## 2017-02-27 ENCOUNTER — Telehealth: Payer: Self-pay | Admitting: Neurology

## 2017-02-27 NOTE — Telephone Encounter (Signed)
Dr. Krista Blue has reviewed her chart and would like for her to come in for appt w/ NP to be seen sooner.  Offered multiple appts but patient wishes to wait until 03/14/17 to see Dr. Krista Blue.  Feels symptoms are tolerable right now.  Instructed her to call back if her pain worsens.

## 2017-02-27 NOTE — Telephone Encounter (Signed)
Patient called office in reference to nerve shooting from thumb to wrist bilateral hands, but worse on the left.  Symptoms have been present for a few days worsening today.  Patient requesting to be seen with Dr. Krista Blue. If patient does not answer home phone please call cell phone at [phone]

## 2017-03-06 ENCOUNTER — Encounter: Payer: Self-pay | Admitting: Neurology

## 2017-03-06 ENCOUNTER — Ambulatory Visit (INDEPENDENT_AMBULATORY_CARE_PROVIDER_SITE_OTHER): Payer: Medicare Other | Admitting: Neurology

## 2017-03-06 VITALS — BP 161/69 | HR 96 | Ht 62.0 in | Wt 170.5 lb

## 2017-03-06 DIAGNOSIS — R42 Dizziness and giddiness: Secondary | ICD-10-CM | POA: Diagnosis not present

## 2017-03-06 DIAGNOSIS — M79642 Pain in left hand: Secondary | ICD-10-CM | POA: Diagnosis not present

## 2017-03-06 MED ORDER — DICLOFENAC SODIUM 1 % TD GEL: 4.0000 g | Freq: Four times a day (QID) | TRANSDERMAL | 6 refills | Status: DC

## 2017-03-06 NOTE — Progress Notes (Signed)
Chief Complaint  Patient presents with  . Neck Pain    She has been experiencing worsening neck and bilateral hand pain.  She also notes pain in her ankles.  She has restarted gabapentin 600mg , TID.      PATIENT: [name] DOB: 03-Mar-1951  Chief Complaint  Patient presents with  . Neck Pain    She has been experiencing worsening neck and bilateral hand pain.  She also notes pain in her ankles.  She has restarted gabapentin 600mg , TID.     HISTORICAL  [name] is a 66 years old right-handed female, seen in refer by her primary care physician Dr.Janice Theola Sequin  for evaluation of one episode of sudden onset left-sided weakness, difficulty texting, Initial evaluation was in November 21st 2016  She had a history of hypertension, diabetes since 2012, hyperlipidemia, past history of depression, kidney stone, lumbar decompression surgery in May 2016 by neurosurgeon Dr. Joya Salm, bilateral knee replacements, She had cervical decompression surgery in 2009, by Dr. Joya Salm, prior to surgery, she had neck pain, shooting pain to her spine,   In July 25 2015, while his she was texting using her phone,, she noticed she typed the wrong letters, also noticed mild left arm weakness, when she called her aunt, she was noticed to have slurred speech, her aunt was a Equities trader, who came by to check on the patient, noticed that she was mildly weak on the left arm and leg, mild slurred speech, initial weakness last about 30 minutes, she felt tired, took a nap,  Since the incident, she continue complains of fatigue,          She also complains of gradual onset mild memory trouble, missing her routine lunch appointment with her family, tends to take the note Laboratory evaluation showed normal B12, TSH,  UPDATE Nov 09 2015: Her left side numbness weakness is getting better. She still has right lumbar radiculopathy, radiating pain has improved post lumbar decompression surgery in May 2016,  getting worse after bearing weight.  She has frequent bilateral legs, left hand muscle spasm, she does have neck pain, hx of cervical fusion in the past.  She has tried valium, flexeril, without helping her symptoms  MRI of the brain in December 2016 that was normal, MRI of lumbar in 2015 prior to lumbar decompression surgery in May 2016, multi-level degenerative disc disease, most severe at L4-5, L3 and 4, with moderate to severe bilateral foraminal stenosis, mild central canal stenosis  Echocardiogram was within normal limit, ultrasound of carotid artery showed no significant abnormality  Laboratory evaluation, normal CMP, glucose 106 mild elevated, creatinine was 1.0, normal CBC, with hemoglobin of 10 point 2, mild decrease, magnesium 1.8 T1 was within normal limits 7, folic acid 8.9 S56 more than 1700, A1c was 5.4, normal thyroid functional task, TSH, cholesterol 166 LDL 23 HDL was 133, normal iron panel  Update Feb 16 2016: EMG nerve conduction study March 2017: Showed evidence of mild chronic left lumbar radiculopathy mainly involving left L4-5 myotomes. There is no evidence of right upper extremity neuropathy or right cervical radiculopathy.  She complains of chronic low back pain neck pain, radiating pain to bilateral upper extremity, had a history of cervical decompression surgery in 2009 by Dr. Joya Salm after heavy lifting, radiating pain to her spine sounds like cervical myelopathy  Trileptal 150 mg every night has helped her muscle cramping  UPDATE Sept 25th 2017: She fell twice on April 30 2016, fell at her closet  at evening time, she denied confusion, no loss of consciousness, but could not reach her phone asking for help until 3 days later on May 03 2016, eventually she was able to knock over a table get to her phone to call 911, she was admitted to the hospital, was found to have hyponatremia, hyperkalemia, acute renal failure, metabolic derangements, suspected that patient presented  with a component of adrenal insufficiency with abrupt discontinuation of the steroid, she was on tapering dose of steroid by her dermatologist for skin rash at her back, with a diagnosis of lichen planus, she was discharged to rehabilitation [facility] for 2 weeks, now she is back home, she lives alone.  She has lost 33 Lbs in 6 weeks, she now complains of worsening balance, she also complains of neck pain,[place] signs,  Increased bilateral lower extremity swelling, rashes,   She has worsening bilateral lower extremity paresthesia, worsening chronic constipation, worsening urinary urgency, occasionally incontinence, we have personally reviewed MRI cervical spine May 2017: Multilevel degenerative changes, evidence of previous fusion anterior fusion A6-3,-0,-1, T1 with metallic hardware  1. At C3-4: disc bulging and uncovertebral joint hypertrophy with moderate spinal stenosis and severe biforaminal stenosis. 2. At At C4-5, C5-6, C7-T1: uncovertebral joint hypertrophy with severe biforaminal stenosis. 3. At S0-1: metallic artifact projecting posteriorly into the spinal canal; uncovertebral joint hypertrophy and facet hypertrophy with severe biforaminal stenosis.  Reviewed laboratory evaluations, A1c 5.5, CPK was elevated 545, normal CBC with hemoglobin of 11 point 5, upon admission on May 03 2016, potassium 6.0, glucose 146,sodium 129, chloride 18, GFR 17, normal U93 235, folic acid 57.3, ferritin 48, decreased iron 21  UPDATE Feb 8th 2018: I reviewed and summarized hospital admission in January 2018, she was admitted for pulmonary emboli, she presented with frequent fall, worsening shortness of breath, she also had right hand abscess, was treated with I&D, vancomycin, discharged on by mouth doxycycline,  I personally reviewed CT head without contrast in January 2018 that was normal, CT angiogram for PE protocol showed pulmonary emboli in several proximal right upper lobe pulmonary arterial branch,  as well in the posterior segment of right upper lobe pulmonary arterial branch,  She was seen by Dr .Benetta Spar, and [facility] for evaluation of abnormal cervical spine, both seemed she is not a surgical candidate,    She complains of intermittent falling episodes, on further questioning,  she denies significant gait abnormality, but sometimes she has sudden onset unwarning falling, she described one episode shortly after a big lunch, she was standing talking with her aunt, she fell without warning signs, there was one episode of after watching TV for a few hours, she got up bending over then fell forward.   Today there was moderate orthostatic blood pressure changes, lying down blood pressure 148/70, heart rate of 90, standing up 129/87 heart rate of 110.  She had gestational diabetes, over the past few years, also developed abnormal glucose, on diet only, she has been taking Lyrica for fibromyalgia, denies significant lower extremity paresthesia, she does endorse occasionally orthostatic dizziness  UPDATE Mar 06 2017: She has not falling since last visit in Feb 2018, she complains of dizziness with neck hyperextension.  She also complains of hand pain, shooting pain in left thumb,  Multiple joints pain, bilateral ankle pain and swelling   REVIEW OF SYSTEMS: Full 14 system review of systems performed and notable only for frequent falling  ALLERGIES: Allergies  Allergen Reactions  . Formaldehyde     Other reaction(s): Shortness  Of Breath, coughing  . Altace [Ramipril] Cough  . Dilaudid [Hydromorphone] Itching    Drip OK, pills cause itching  . Zosyn [Piperacillin Sod-Tazobactam So] Itching and Other (See Comments)    flushing    HOME MEDICATIONS: Current Outpatient Prescriptions  Medication Sig Dispense Refill  . Acetaminophen (TYLENOL PO) Take by mouth as needed.    Marland Kitchen apixaban (ELIQUIS) 5 MG TABS tablet Use 2 tablets twice per day for 7 days (through 10/24/2016).  Then start 1 tablet  twice daily thereafter. 70 tablet 0  . cyclobenzaprine (FLEXERIL) 10 MG tablet Take 10 mg by mouth 3 (three) times daily as needed for muscle spasms.    Marland Kitchen estradiol (ESTRACE) 1 MG tablet Take 1 mg by mouth daily.     Marland Kitchen gabapentin (NEURONTIN) 300 MG capsule Take 600 mg by mouth 3 (three) times daily.    Marland Kitchen levothyroxine (SYNTHROID, LEVOTHROID) 25 MCG tablet Take 25 mcg by mouth daily before breakfast.    . omeprazole (PRILOSEC) 20 MG capsule Take 1 capsule (20 mg total) by mouth 2 (two) times daily before a meal. 60 capsule 6  . Oxycodone HCl 20 MG TABS Take 1 tablet (20 mg total) by mouth 3 (three) times daily as needed (for pain). 90 tablet 0  . Probiotic Product (PROBIOTIC PO) Take by mouth daily.    . promethazine (PHENERGAN) 25 MG tablet Take 25 mg by mouth every 6 (six) hours as needed for nausea or vomiting.    . temazepam (RESTORIL) 30 MG capsule Take 30 mg by mouth at bedtime.  1  . triamcinolone (NASACORT ALLERGY 24HR) 55 MCG/ACT AERO nasal inhaler Place 2 sprays into the nose daily as needed (for congestion).    . Vitamin D, Ergocalciferol, (DRISDOL) 50000 units CAPS capsule Take 50,000 Units by mouth once a week.     Current Facility-Administered Medications  Medication Dose Route Frequency Provider Last Rate Last Dose  . glipiZIDE (GLUCOTROL) tablet 2.5 mg  2.5 mg Oral BID AC Reyne Dumas, MD        PAST MEDICAL HISTORY: Past Medical History:  Diagnosis Date  . Anemia   . Cataracts, bilateral   . Chronic back pain    scoliosis/displacement of lumbosacral intervertebral disc/stenosis  . Depression    but doens't take any meds  . Diabetes mellitus without complication ([place])    takes Metformin daily  . Family history of adverse reaction to anesthesia    pts mom would get short of breath after anesthesia but was a smoker  . Fibromyalgia    takes Gabapentin daily  . Fusion of lumbar spine   . GERD (gastroesophageal reflux disease)    takes Nexium daily  . Heart murmur   .  Hemorrhoids   . History of blood transfusion 2013   no abnormal reaction noted  . History of bronchitis 2009  . History of colon polyps    benign  . History of hiatal hernia   . History of kidney stones    has a kidney stone now  . History of migraine    hasn't had one in over a yr  . History of surgery on arm    plates and screws  . Hypertension   . Hypothyroidism    takes Synthroid daily  . Idiopathic thrombocytopenia ([place])   . Insomnia    takes Restoril nightly as needed   . Memory loss   . Muscle spasm    takes Flexeril daily as needed  . Numbness and  tingling    both toes  . PONV (postoperative nausea and vomiting)   . Shortness of breath dyspnea    with exertion but states its bc she can't exercise  . Urinary frequency   . Vitamin D deficiency     PAST SURGICAL HISTORY: Past Surgical History:  Procedure Laterality Date  . ABDOMINAL HYSTERECTOMY    . CARDIAC CATHETERIZATION  15yrs ago  . CATARACT EXTRACTION, BILATERAL    . CERVICAL FUSION    . COLONOSCOPY    . ESOPHAGOGASTRODUODENOSCOPY    . GASTRIC BYPASS    . I&D EXTREMITY Right 10/14/2016   Procedure: IRRIGATION AND DEBRIDEMENT RIGHT HAND;  Surgeon: Dayna Barker, MD;  Location: [facility];  Service: Plastics;  Laterality: Right;  . JOINT REPLACEMENT Bilateral   . kidney stone removed    . KNEE ARTHROSCOPY     total of 7 between both knees  . left arm surgery     pins  . LUMBAR FUSION    . TONSILLECTOMY    . tummy tuck       FAMILY HISTORY: Family History  Problem Relation Age of Onset  . Lung cancer Mother   . COPD Mother   . Hypertension Mother   . Depression Mother   . Lymphoma Paternal Uncle   . Diabetes Maternal Grandmother        x 5 uncles  . Heart disease Unknown        Maternal and Paternal    SOCIAL HISTORY:  Social History   Social History  . Marital status: Single    Spouse name: N/A  . Number of children: 1  . Years of education: 48   Occupational History  . Retired Therapist, sports /  Disabled    Social History Main Topics  . Smoking status: Never Smoker  . Smokeless tobacco: Never Used  . Alcohol use No  . Drug use: No  . Sexual activity: Not on file   Other Topics Concern  . Not on file   Social History Narrative   Lives at home alone.   Right handed.   Pot of coffee each morning.     PHYSICAL EXAM   Vitals:   03/06/17 1351  BP: (!) 161/69  Pulse: 96  Weight: 170 lb 8 oz (77.3 kg)  Height: 5\' 2"  (1.575 m)    Not recorded    Lying down blood pressure 148/72, heart rate of 90, standing up blood pressure 129/87 heart rate of 110, standing for 5 minutes, blood pressure 145/74 heart rate of 99.  Body mass index is 31.18 kg/m.  PHYSICAL EXAMNIATION:  Gen: NAD, conversant, well nourised, obese, well groomed                     Cardiovascular: Regular rate rhythm, no peripheral edema, warm, nontender. Eyes: Conjunctivae clear without exudates or hemorrhage Neck: Supple, no carotid bruise. Pulmonary: Clear to auscultation bilaterally   NEUROLOGICAL EXAM:  MENTAL STATUS: Speech:    Speech is normal; fluent and spontaneous with normal comprehension.  Cognition: Mini-Mental Status Examination is 29 out of 30, animal naming is 16,     Orientation to time, place and person     recent and remote memory: She missed one out of 3 recalls      Normal Attention span and concentration     Normal Language, naming, repeating,spontaneous speech     Fund of knowledge   CRANIAL NERVES: CN II: Visual fields are full to confrontation. Fundoscopic  exam is normal with sharp discs and no vascular changes. Pupils are round equal and briskly reactive to light. CN III, IV, VI: extraocular movement are normal. No ptosis. CN V: Facial sensation is intact to pinprick in all 3 divisions bilaterally. Corneal responses are intact.  CN VII: Face is symmetric with normal eye closure and smile. CN VIII: Hearing is normal to rubbing fingers CN IX, X: Palate elevates  symmetrically. Phonation is normal. CN XI: Head turning and shoulder shrug are intact CN XII: Tongue is midline with normal movements and no atrophy.  MOTOR: She has moderate bilateral hip flexion weakness  REFLEXES: Reflexes are 1 and symmetric at the biceps, triceps, knees, and ankles. Plantar responses are flexor.  SENSORY: Mildly dense dependent light touch, pinprick,  and vibration sensation at toes   COORDINATION: Rapid alternating movements and fine finger movements are intact. There is no dysmetria on finger-to-nose and heel-knee-shin.    GAIT/STANCE: She needs push up to get up from seated position, cautious, stiff gait   DIAGNOSTIC DATA (LABS, IMAGING, TESTING) - I reviewed patient records, labs, notes, testing and imaging myself where available.   ASSESSMENT AND PLAN  [name] is a 66 y.o. female  Lumbar radiculopathy frequent bilateral lower extremity muscle cramping  gabapentin 600 mg 3 times a day   moderate to severe canal stenosis at C6-7, moderate stenosis at C3-4  No evidence of cervical myelopathy on examination  Frequent falling, Dizziness with hyper extending of her neck   Likely related to orthostatic blood pressure changes, also long-standing history of abnormal glucose level, deconditioning   I advised her increase water intake, countertraction maneuver,    left thumb radiating pain, multiple joints pain,   Most consistent with musculoskeletal etiology,  I suggested diclofenac gel, heating pad,   Referred to rheumatologist   Marcial Pacas, M.D. Ph.D.  [facility] [address] [place], [place] [address] Ph: [phone] Fax: [phone]  CC: Referring Provider

## 2017-03-14 ENCOUNTER — Ambulatory Visit: Payer: Self-pay | Admitting: Neurology

## 2017-05-01 ENCOUNTER — Observation Stay (HOSPITAL_COMMUNITY)
Admission: EM | Admit: 2017-05-01 | Discharge: 2017-05-02 | Disposition: A | Discharge status: Home / Self Care | Payer: Medicare Other | Attending: Internal Medicine | Admitting: Internal Medicine

## 2017-05-01 ENCOUNTER — Encounter (HOSPITAL_COMMUNITY): Payer: Self-pay | Admitting: Emergency Medicine

## 2017-05-01 DIAGNOSIS — Z79899 Other long term (current) drug therapy: Secondary | ICD-10-CM | POA: Diagnosis not present

## 2017-05-01 DIAGNOSIS — E1169 Type 2 diabetes mellitus with other specified complication: Secondary | ICD-10-CM | POA: Diagnosis present

## 2017-05-01 DIAGNOSIS — E119 Type 2 diabetes mellitus without complications: Secondary | ICD-10-CM | POA: Diagnosis not present

## 2017-05-01 DIAGNOSIS — E871 Hypo-osmolality and hyponatremia: Secondary | ICD-10-CM | POA: Diagnosis present

## 2017-05-01 DIAGNOSIS — D509 Iron deficiency anemia, unspecified: Secondary | ICD-10-CM

## 2017-05-01 DIAGNOSIS — R600 Localized edema: Secondary | ICD-10-CM

## 2017-05-01 DIAGNOSIS — E669 Obesity, unspecified: Secondary | ICD-10-CM

## 2017-05-01 DIAGNOSIS — E039 Hypothyroidism, unspecified: Secondary | ICD-10-CM | POA: Diagnosis present

## 2017-05-01 DIAGNOSIS — D649 Anemia, unspecified: Principal | ICD-10-CM | POA: Insufficient documentation

## 2017-05-01 DIAGNOSIS — I2699 Other pulmonary embolism without acute cor pulmonale: Secondary | ICD-10-CM | POA: Diagnosis present

## 2017-05-01 DIAGNOSIS — R531 Weakness: Secondary | ICD-10-CM | POA: Diagnosis present

## 2017-05-01 LAB — CBC WITH DIFFERENTIAL/PLATELET
BASOS PCT: 0 %
Basophils Absolute: 0 10*3/uL (ref 0.0–0.1)
EOS ABS: 0.1 10*3/uL (ref 0.0–0.7)
Eosinophils Relative: 2 %
HCT: 25 % — ABNORMAL LOW (ref 36.0–46.0)
HEMOGLOBIN: 7.3 g/dL — AB (ref 12.0–15.0)
Lymphocytes Relative: 37 %
Lymphs Abs: 2 10*3/uL (ref 0.7–4.0)
MCH: 19.6 pg — ABNORMAL LOW (ref 26.0–34.0)
MCHC: 29.2 g/dL — ABNORMAL LOW (ref 30.0–36.0)
MCV: 67.2 fL — ABNORMAL LOW (ref 78.0–100.0)
MONOS PCT: 9 %
Monocytes Absolute: 0.5 10*3/uL (ref 0.1–1.0)
NEUTROS PCT: 52 %
Neutro Abs: 2.8 10*3/uL (ref 1.7–7.7)
Platelets: 176 10*3/uL (ref 150–400)
RBC: 3.72 MIL/uL — AB (ref 3.87–5.11)
RDW: 20 % — ABNORMAL HIGH (ref 11.5–15.5)
WBC: 5.4 10*3/uL (ref 4.0–10.5)

## 2017-05-01 LAB — FERRITIN: Ferritin: 6 ng/mL — ABNORMAL LOW (ref 11–307)

## 2017-05-01 LAB — COMPREHENSIVE METABOLIC PANEL
ALBUMIN: 3.3 g/dL — AB (ref 3.5–5.0)
ALK PHOS: 118 U/L (ref 38–126)
ALT: 13 U/L — ABNORMAL LOW (ref 14–54)
ANION GAP: 7 (ref 5–15)
AST: 15 U/L (ref 15–41)
BUN: 12 mg/dL (ref 6–20)
CALCIUM: 8.5 mg/dL — AB (ref 8.9–10.3)
CO2: 23 mmol/L (ref 22–32)
Chloride: 100 mmol/L — ABNORMAL LOW (ref 101–111)
Creatinine, Ser: 0.91 mg/dL (ref 0.44–1.00)
GFR calc Af Amer: >60 mL/min (ref >60)
GFR calc non Af Amer: >60 mL/min (ref >60)
GLUCOSE: 101 mg/dL — AB (ref 65–99)
Potassium: 3.9 mmol/L (ref 3.5–5.1)
SODIUM: 130 mmol/L — AB (ref 135–145)
Total Bilirubin: 0.4 mg/dL (ref 0.3–1.2)
Total Protein: 6.1 g/dL — ABNORMAL LOW (ref 6.5–8.1)

## 2017-05-01 LAB — PREPARE RBC (CROSSMATCH)

## 2017-05-01 LAB — FOLATE: Folate: 26.2 ng/mL (ref >5.9)

## 2017-05-01 LAB — VITAMIN B12: Vitamin B-12: 191 pg/mL (ref 180–914)

## 2017-05-01 LAB — ABO/RH: ABO/RH(D): O POS

## 2017-05-01 LAB — IRON AND TIBC
IRON: 18 ug/dL — AB (ref 28–170)
Saturation Ratios: 4 % — ABNORMAL LOW (ref 10.4–31.8)
TIBC: 486 ug/dL — AB (ref 250–450)
UIBC: 468 ug/dL

## 2017-05-01 LAB — RETICULOCYTES
RBC.: 3.63 MIL/uL — ABNORMAL LOW (ref 3.87–5.11)
Retic Count, Absolute: 50.8 10*3/uL (ref 19.0–186.0)
Retic Ct Pct: 1.4 % (ref 0.4–3.1)

## 2017-05-01 LAB — PROTIME-INR
INR: 1.06
Prothrombin Time: 13.9 seconds (ref 11.4–15.2)

## 2017-05-01 MED ORDER — ONDANSETRON HCL 4 MG PO TABS: 4.0000 mg | ORAL_TABLET | Freq: Four times a day (QID) | ORAL | Status: DC | PRN

## 2017-05-01 MED ORDER — OXYCODONE HCL 5 MG PO TABS
20.0000 mg | ORAL_TABLET | Freq: Three times a day (TID) | ORAL | Status: DC | PRN
  Administered 2017-05-02: 20 mg via ORAL
  Filled 2017-05-01 (×2): qty 4

## 2017-05-01 MED ORDER — ESTRADIOL 1 MG PO TABS
1.0000 mg | ORAL_TABLET | Freq: Every day | ORAL | Status: DC
  Filled 2017-05-01: qty 1

## 2017-05-01 MED ORDER — CYCLOBENZAPRINE HCL 10 MG PO TABS: 10.0000 mg | ORAL_TABLET | Freq: Three times a day (TID) | ORAL | Status: DC | PRN

## 2017-05-01 MED ORDER — POLYVINYL ALCOHOL 1.4 % OP SOLN: 2.0000 [drp] | Freq: Every day | OPHTHALMIC | Status: DC | PRN

## 2017-05-01 MED ORDER — LEVOTHYROXINE SODIUM 25 MCG PO TABS
25.0000 ug | ORAL_TABLET | Freq: Every day | ORAL | Status: DC
  Administered 2017-05-02: 25 ug via ORAL
  Filled 2017-05-01: qty 1

## 2017-05-01 MED ORDER — TRIAMCINOLONE ACETONIDE 55 MCG/ACT NA AERO
2.0000 | INHALATION_SPRAY | Freq: Every day | NASAL | Status: DC | PRN
  Filled 2017-05-01: qty 21.6

## 2017-05-01 MED ORDER — SODIUM CHLORIDE 0.9 % IV SOLN
1000.0000 mg | Freq: Once | INTRAVENOUS | Status: DC
  Filled 2017-05-01: qty 20

## 2017-05-01 MED ORDER — SALINE SENSITIVE EYES SOLN: 1.0000 [drp] | Freq: Every day | Status: DC | PRN

## 2017-05-01 MED ORDER — PROMETHAZINE HCL 12.5 MG PO TABS: 25.0000 mg | ORAL_TABLET | Freq: Four times a day (QID) | ORAL | Status: DC | PRN

## 2017-05-01 MED ORDER — ACETAMINOPHEN 325 MG PO TABS
650.0000 mg | ORAL_TABLET | Freq: Four times a day (QID) | ORAL | Status: DC | PRN
  Administered 2017-05-02: 650 mg via ORAL
  Filled 2017-05-01: qty 2

## 2017-05-01 MED ORDER — ACETAMINOPHEN 650 MG RE SUPP: 650.0000 mg | Freq: Four times a day (QID) | RECTAL | Status: DC | PRN

## 2017-05-01 MED ORDER — DIPHENHYDRAMINE HCL 25 MG PO CAPS
25.0000 mg | ORAL_CAPSULE | Freq: Four times a day (QID) | ORAL | Status: DC | PRN
  Filled 2017-05-01: qty 1

## 2017-05-01 MED ORDER — CLONIDINE HCL 0.1 MG PO TABS: 0.1000 mg | ORAL_TABLET | Freq: Three times a day (TID) | ORAL | Status: DC | PRN

## 2017-05-01 MED ORDER — PANTOPRAZOLE SODIUM 40 MG PO TBEC
40.0000 mg | DELAYED_RELEASE_TABLET | Freq: Every day | ORAL | Status: DC
  Administered 2017-05-01 – 2017-05-02 (×2): 40 mg via ORAL
  Filled 2017-05-01 (×3): qty 1

## 2017-05-01 MED ORDER — TEMAZEPAM 15 MG PO CAPS
30.0000 mg | ORAL_CAPSULE | Freq: Every evening | ORAL | Status: DC | PRN
  Administered 2017-05-01: 30 mg via ORAL
  Filled 2017-05-01: qty 2

## 2017-05-01 MED ORDER — SODIUM CHLORIDE 0.9 % IV SOLN: Freq: Once | INTRAVENOUS | Status: DC

## 2017-05-01 MED ORDER — CLONIDINE HCL 0.1 MG PO TABS: 0.1000 mg | ORAL_TABLET | Freq: Two times a day (BID) | ORAL | Status: DC | PRN

## 2017-05-01 MED ORDER — ONDANSETRON HCL 4 MG/2ML IJ SOLN: 4.0000 mg | Freq: Four times a day (QID) | INTRAMUSCULAR | Status: DC | PRN

## 2017-05-01 MED ORDER — APIXABAN 5 MG PO TABS
5.0000 mg | ORAL_TABLET | Freq: Two times a day (BID) | ORAL | Status: DC
  Filled 2017-05-01: qty 1

## 2017-05-01 MED ORDER — GABAPENTIN 300 MG PO CAPS
600.0000 mg | ORAL_CAPSULE | Freq: Three times a day (TID) | ORAL | Status: DC
  Administered 2017-05-01 – 2017-05-02 (×2): 600 mg via ORAL
  Filled 2017-05-01 (×2): qty 2

## 2017-05-01 NOTE — H&P (Signed)
History and Physical    [name] RSW:[id number] DOB: [birth year] DOA: 05/01/2017  PCP: Joya Gaskins, MD   Patient coming from: Home.  I have personally briefly reviewed patient's old medical records in [place]  Chief Complaint: Low hemoglobin.  HPI: [name] is a 66 y.o. female with medical history significant of anemia, bilateral cataracts,  depression (not taking medications), insomnia, glucose intolerance (last hemoglobin A1c 6.8% per patient), fibromyalgia, chronic back pain, cervical and lumbar spine fusion, heart murmur, grade 1 diastolic dysfunction, hemorrhoids, GERD, hiatal hernia, ITP, urolithiasis, hypothyroidism, numbness of the toes, vitamin B deficiency, vitamin D deficiency who was admitted and treated for acute pulmonary embolism in January now on Eliquis who is coming to the emergency department after her PCP called her with results of her hemoglobin and asked her to come to the hospital for PRBC transfusion. She states that she has been feeling very fatigued, dyspneic on exertion and had an episode of chest pressure while dyspneic, but denies precordial chest pain, palpitations, diaphoresis, PND or orthopnea. She mentions that she had a normal cardiac cath not that long ago that did not show any significant CAD. No fever, no chills, no productive cough, occasional indigestion, but no nausea, emesis, melena or hematochezia. Denies hematuria, frequency or dysuria.   ED Course: Initial vital signs temperature 36.7C, pulse 93, blood pressure 170/76, respirations 18 and O2 sat 100% on room air. Workup in the emergency department shows a hemoglobin level of 7.3 g/dL, WBC 5.4 and platelets 176. Sodium was 130, potassium 3.9, chloride 100, bicarbonate 23 mmol/L. BUN was 12, creatinine 0.91 glucose 101 mg/dL. A 2 unit packed RBC order was started in the emergency department.  Review of Systems: As per HPI otherwise 10 point review of systems negative.     Past Medical History:  Diagnosis Date  . Anemia   . Cataracts, bilateral   . Chronic back pain    scoliosis/displacement of lumbosacral intervertebral disc/stenosis  . Depression    but doens't take any meds  . Diabetes mellitus without complication ([place])    takes Metformin daily  . Family history of adverse reaction to anesthesia    pts mom would get short of breath after anesthesia but was a smoker  . Fibromyalgia    takes Gabapentin daily  . Fusion of lumbar spine   . GERD (gastroesophageal reflux disease)    takes Nexium daily  . Heart murmur   . Hemorrhoids   . History of blood transfusion 2013   no abnormal reaction noted  . History of bronchitis 2009  . History of colon polyps    benign  . History of hiatal hernia   . History of kidney stones    has a kidney stone now  . History of migraine    hasn't had one in over a yr  . History of surgery on arm    plates and screws  . Hypertension   . Hypothyroidism    takes Synthroid daily  . Idiopathic thrombocytopenia ([place])   . Insomnia    takes Restoril nightly as needed   . Memory loss   . Muscle spasm    takes Flexeril daily as needed  . Numbness and tingling    both toes  . PONV (postoperative nausea and vomiting)   . Shortness of breath dyspnea    with exertion but states its bc she can't exercise  . Urinary frequency   . Vitamin D deficiency  Past Surgical History:  Procedure Laterality Date  . ABDOMINAL HYSTERECTOMY    . CARDIAC CATHETERIZATION  61yrs ago  . CATARACT EXTRACTION, BILATERAL    . CERVICAL FUSION    . COLONOSCOPY    . ESOPHAGOGASTRODUODENOSCOPY    . GASTRIC BYPASS    . I&D EXTREMITY Right 10/14/2016   Procedure: IRRIGATION AND DEBRIDEMENT RIGHT HAND;  Surgeon: Dayna Barker, MD;  Location: [facility];  Service: Plastics;  Laterality: Right;  . JOINT REPLACEMENT Bilateral   . kidney stone removed    . KNEE ARTHROSCOPY     total of 7 between both knees  . left arm surgery     pins   . LUMBAR FUSION    . TONSILLECTOMY    . tummy tuck        reports that she has never smoked. She has never used smokeless tobacco. She reports that she does not drink alcohol or use drugs.  Allergies  Allergen Reactions  . Formaldehyde     Other reaction(s): Shortness Of Breath, coughing  . Altace [Ramipril] Cough  . Dilaudid [Hydromorphone] Itching    Drip OK, pills cause itching - can take for a short period of time but nothing extended  . Zosyn [Piperacillin Sod-Tazobactam So] Itching and Other (See Comments)    flushing    Family History  Problem Relation Age of Onset  . Lung cancer Mother   . COPD Mother   . Hypertension Mother   . Depression Mother   . Lymphoma Paternal Uncle   . Diabetes Maternal Grandmother        x 5 uncles  . Heart disease Unknown        Maternal and Paternal    Prior to Admission medications   Medication Sig Start Date End Date Taking? Authorizing Provider  acetaminophen (TYLENOL) 500 MG tablet Take 1,000 mg by mouth every 6 (six) hours as needed for mild pain or moderate pain.   Yes [provider]  apixaban (ELIQUIS) 5 MG TABS tablet Use 2 tablets twice per day for 7 days (through 10/24/2016).  Then start 1 tablet twice daily thereafter. Patient taking differently: Take 5 mg by mouth 2 (two) times daily. Use 2 tablets twice per day for 7 days (through 10/24/2016).  Then start 1 tablet twice daily thereafter. 10/17/16  Yes Mikhail, Velta Addison, DO  cyclobenzaprine (FLEXERIL) 10 MG tablet Take 10 mg by mouth 3 (three) times daily as needed for muscle spasms.   Yes [provider]  Dentifrices (BIOTENE DRY MOUTH CARE DT) Take 1 lozenge by mouth daily as needed.   Yes [provider]  diphenhydrAMINE (BENADRYL) 25 MG tablet Take 25 mg by mouth every 6 (six) hours as needed for itching, allergies or sleep.   Yes [provider]  estradiol (ESTRACE) 1 MG tablet Take 1 mg by mouth daily.    Yes [provider]   gabapentin (NEURONTIN) 300 MG capsule Take 600 mg by mouth 3 (three) times daily.   Yes [provider]  levothyroxine (SYNTHROID, LEVOTHROID) 25 MCG tablet Take 25 mcg by mouth daily before breakfast.   Yes [provider]  omeprazole (PRILOSEC) 20 MG capsule Take 1 capsule (20 mg total) by mouth 2 (two) times daily before a meal. 03/01/15  Yes Lafayette Dragon, MD  Oxycodone HCl 20 MG TABS Take 1 tablet (20 mg total) by mouth 3 (three) times daily as needed (for pain). 05/10/16  Yes Reed, Tiffany L, DO  promethazine (PHENERGAN) 25  MG tablet Take 25 mg by mouth every 6 (six) hours as needed for nausea or vomiting.   Yes [provider]  Soft Lens Products (SALINE SENSITIVE EYES) SOLN Place 1-2 drops into both eyes daily as needed.   Yes [provider]  temazepam (RESTORIL) 30 MG capsule Take 30 mg by mouth at bedtime. 08/29/16  Yes [provider]  triamcinolone (NASACORT ALLERGY 24HR) 55 MCG/ACT AERO nasal inhaler Place 2 sprays into the nose daily as needed (for congestion).   Yes [provider]  Vitamin D, Ergocalciferol, (DRISDOL) 50000 units CAPS capsule Take 50,000 Units by mouth once a week. 02/03/15   [provider]    Physical Exam: Vitals:   05/01/17 1540 05/01/17 1721  BP: (!) 170/66 (!) 179/79  Pulse: 93 78  Resp: 18 18  Temp: 98 F (36.7 C) 98.3 F (36.8 C)  TempSrc: Oral   SpO2: 100% 100%  Weight: 77.1 kg (170 lb)   Height: 5\' 2"  (1.575 m)     Constitutional: NAD, calm, comfortable Eyes: PERRL, lids and conjunctivae are pale ENMT: Mucous membranes are moist. Posterior pharynx clear of any exudate or lesions. Neck: normal, supple, no masses, no thyromegaly Respiratory: clear to auscultation bilaterally, no wheezing, no crackles. Normal respiratory effort. No accessory muscle use.  Cardiovascular: Regular rate and rhythm, 1/6 diastolic murmur / rubs / gallops. Positive nonpitting right lower extremity edema. 2+  pedal pulses. No carotid bruits.  Abdomen: no tenderness, no masses palpated. No hepatosplenomegaly. Bowel sounds positive.  Musculoskeletal: no clubbing / cyanosis. Good ROM, no contractures. Normal muscle tone.  Skin: Small areas of ecchymosis on extremities. Neurologic: CN 2-12 grossly intact. Sensation intact, DTR normal. Strength 5/5 in all 4.  Psychiatric: Normal judgment and insight. Alert and oriented x 4. Normal mood.    Labs on Admission: I have personally reviewed following labs and imaging studies  CBC:  Recent Labs Lab 05/01/17 1715  WBC 5.4  NEUTROABS 2.8  HGB 7.3*  HCT 25.0*  MCV 67.2*  PLT 161   Basic Metabolic Panel:  Recent Labs Lab 05/01/17 1715  NA 130*  K 3.9  CL 100*  CO2 23  GLUCOSE 101*  BUN 12  CREATININE 0.91  CALCIUM 8.5*   GFR: Estimated Creatinine Clearance: 57.7 mL/min (by C-G formula based on SCr of 0.91 mg/dL). Liver Function Tests:  Recent Labs Lab 05/01/17 1715  AST 15  ALT 13*  ALKPHOS 118  BILITOT 0.4  PROT 6.1*  ALBUMIN 3.3*   No results for input(s): LIPASE, AMYLASE in the last 168 hours. No results for input(s): AMMONIA in the last 168 hours. Coagulation Profile:  Recent Labs Lab 05/01/17 1715  INR 1.06   Cardiac Enzymes: No results for input(s): CKTOTAL, CKMB, CKMBINDEX, TROPONINI in the last 168 hours. BNP (last 3 results) No results for input(s): PROBNP in the last 8760 hours. HbA1C: No results for input(s): HGBA1C in the last 72 hours. CBG: No results for input(s): GLUCAP in the last 168 hours. Lipid Profile: No results for input(s): CHOL, HDL, LDLCALC, TRIG, CHOLHDL, LDLDIRECT in the last 72 hours. Thyroid Function Tests: No results for input(s): TSH, T4TOTAL, FREET4, T3FREE, THYROIDAB in the last 72 hours. Anemia Panel:  Recent Labs  05/01/17 1833  RETICCTPCT 1.4   Urine analysis:    Component Value Date/Time   COLORURINE YELLOW 10/14/2016 1424   APPEARANCEUR TURBID (A) 10/14/2016 1424    LABSPEC 1.025 10/14/2016 1424   PHURINE 6.0 10/14/2016 1424   GLUCOSEU  100 (A) 10/14/2016 1424   HGBUR NEGATIVE 10/14/2016 1424   BILIRUBINUR NEGATIVE 10/14/2016 1424   KETONESUR NEGATIVE 10/14/2016 1424   PROTEINUR NEGATIVE 10/14/2016 1424   NITRITE NEGATIVE 10/14/2016 1424   LEUKOCYTESUR NEGATIVE 10/14/2016 1424    Radiological Exams on Admission: No results found.  EKG: Independently reviewed.   Assessment/Plan Principal Problem:   Symptomatic anemia Observation/MedSurg. Continue packed RBC transfusion. Per patient, she has been unable to tolerate oral iron formulations. Check iron studies on initial blood sample. Previously showed low or low normal. Administer parenteral iron infusion in the morning after blood transfusion.  Active Problems:   Hypothyroidism Continue levothyroxine 25 g by mouth daily. Check TSH in a.m.    Diabetes mellitus type 2 in obese [facility]) Per patient, she is not on glipizide anymore. Continue carbohydrate modified diet. Monitor CBGs are in the hospital.    Hyponatremia Unknown etiology for this at this time. This is a recurring issue. The patient is not taking diuretics, there had not been any GI losses. Check sodium level in a.m. Consider checking urine sodium levels and further workup. Persistent.    Pulmonary embolism ([place]) Per patient, she has been on the anticoagulation therapy for over 7 months now. This is her first thromboembolic event, however her right lower extremity shows significant edema. I will order a venous duplex of the right lower extremity to rule out DVT. If this is negative and her PCP agrees, she might be able to come off the Eliquis. However, she has not seen a pulmonologist or have follow-up specifically for this since being discharge in January.   DVT prophylaxis: On Eliquis. Code Status: Full code. Family Communication:  Disposition Plan: Observation for overnight PRBC transfusion. Consults called:  Admission  status: Observation/Telemetry.   [name] MD [facility] Pager [phone].  If 7PM-7AM, please contact night-coverage [email or web address] Password [facility]  05/01/2017, 7:11 PM

## 2017-05-01 NOTE — ED Triage Notes (Signed)
Pt reports being sent by PCP for evaluation of hgb of 7.6.  Is on Eliquis for chronic PE.

## 2017-05-01 NOTE — ED Provider Notes (Signed)
[facility] DEPT Provider Note   CSN: [id number] Arrival date & time: 05/01/17  1513     History   Chief Complaint Chief Complaint  Patient presents with  . Abnormal Lab    HPI [name] is a 66 y.o. female with past medical history as outlined below, most significant for diabetes, GERD, heart murmur, distant history of acute renal failure, hemorrhoids, heart murmur, history of ITP and pulmonary embolism prompting an admission 6 months ago, currently treated with Eliquis presenting with anemia.  She was seen by her primary provider in [place] this week and was called with results of profound anemia, test repeated yesterday confirmed hemoglobin is 7.6.  She does endorse increased shortness of breath with exertion, also had an episode of transient chest pressure yesterday while having shortness of breath and also endorses decreased exercise tolerance which has been a gradual symptom for the past several weeks.  She denies any unusual bruising or bleeding, denies rectal bleeding or dark tarry stools.  The history is provided by the patient.    Past Medical History:  Diagnosis Date  . Anemia   . Cataracts, bilateral   . Chronic back pain    scoliosis/displacement of lumbosacral intervertebral disc/stenosis  . Depression    but doens't take any meds  . Diabetes mellitus without complication ([place])    takes Metformin daily  . Family history of adverse reaction to anesthesia    pts mom would get short of breath after anesthesia but was a smoker  . Fibromyalgia    takes Gabapentin daily  . Fusion of lumbar spine   . GERD (gastroesophageal reflux disease)    takes Nexium daily  . Heart murmur   . Hemorrhoids   . History of blood transfusion 2013   no abnormal reaction noted  . History of bronchitis 2009  . History of colon polyps    benign  . History of hiatal hernia   . History of kidney stones    has a kidney stone now  . History of migraine    hasn't had one in  over a yr  . History of surgery on arm    plates and screws  . Hypertension   . Hypothyroidism    takes Synthroid daily  . Idiopathic thrombocytopenia ([place])   . Insomnia    takes Restoril nightly as needed   . Memory loss   . Muscle spasm    takes Flexeril daily as needed  . Numbness and tingling    both toes  . PONV (postoperative nausea and vomiting)   . Shortness of breath dyspnea    with exertion but states its bc she can't exercise  . Urinary frequency   . Vitamin D deficiency     Patient Active Problem List   Diagnosis Date Noted  . Symptomatic anemia 05/01/2017  . Left hand pain 03/06/2017  . Dizziness 03/06/2017  . Pulmonary embolism ([place]) 10/09/2016  . 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 Acute renal failure (ARF) ([place])   . Fall   . Chronic low back pain   . Radiculopathy, thoracolumbar region   . Failed back syndrome   . Anemia due to bone marrow failure ([place])   . Protein-calorie malnutrition, severe ([place]) 05/05/2016  . AKI (acute kidney injury) ([place]) 05/03/2016  . Hyperkalemia 05/03/2016  . Hypothyroidism 05/03/2016  . Hypertension 05/03/2016  . Rhabdomyolysis 05/03/2016  . Diabetes mellitus type 2 in obese ([place]) 05/03/2016  . Anemia 05/03/2016  .  GERD (gastroesophageal reflux disease) 05/03/2016  . Fibromyalgia 05/03/2016  . Hyponatremia 05/03/2016  . Weakness 05/03/2016  . Neck pain 02/16/2016  . Muscle cramp 11/09/2015  . Left-sided weakness 08/29/2015  . Spondylolisthesis of lumbar region 02/08/2015    Past Surgical History:  Procedure Laterality Date  . ABDOMINAL HYSTERECTOMY    . CARDIAC CATHETERIZATION  36yrs ago  . CATARACT EXTRACTION, BILATERAL    . CERVICAL FUSION    . COLONOSCOPY    . ESOPHAGOGASTRODUODENOSCOPY    . GASTRIC BYPASS    . I&D EXTREMITY Right 10/14/2016   Procedure: IRRIGATION AND DEBRIDEMENT RIGHT HAND;  Surgeon: Dayna Barker, MD;  Location: [facility];  Service: Plastics;  Laterality:  Right;  . JOINT REPLACEMENT Bilateral   . kidney stone removed    . KNEE ARTHROSCOPY     total of 7 between both knees  . left arm surgery     pins  . LUMBAR FUSION    . TONSILLECTOMY    . tummy tuck       OB History    No data available       Home Medications    Prior to Admission medications   Medication Sig Start Date End Date Taking? Authorizing Provider  acetaminophen (TYLENOL) 500 MG tablet Take 1,000 mg by mouth every 6 (six) hours as needed for mild pain or moderate pain.   Yes [provider]  apixaban (ELIQUIS) 5 MG TABS tablet Use 2 tablets twice per day for 7 days (through 10/24/2016).  Then start 1 tablet twice daily thereafter. Patient taking differently: Take 5 mg by mouth 2 (two) times daily. Use 2 tablets twice per day for 7 days (through 10/24/2016).  Then start 1 tablet twice daily thereafter. 10/17/16  Yes Mikhail, Velta Addison, DO  cyclobenzaprine (FLEXERIL) 10 MG tablet Take 10 mg by mouth 3 (three) times daily as needed for muscle spasms.   Yes [provider]  Dentifrices (BIOTENE DRY MOUTH CARE DT) Take 1 lozenge by mouth daily as needed.   Yes [provider]  diphenhydrAMINE (BENADRYL) 25 MG tablet Take 25 mg by mouth every 6 (six) hours as needed for itching, allergies or sleep.   Yes [provider]  estradiol (ESTRACE) 1 MG tablet Take 1 mg by mouth daily.    Yes [provider]  gabapentin (NEURONTIN) 300 MG capsule Take 600 mg by mouth 3 (three) times daily.   Yes [provider]  levothyroxine (SYNTHROID, LEVOTHROID) 25 MCG tablet Take 25 mcg by mouth daily before breakfast.   Yes [provider]  omeprazole (PRILOSEC) 20 MG capsule Take 1 capsule (20 mg total) by mouth 2 (two) times daily before a meal. 03/01/15  Yes Lafayette Dragon, MD  Oxycodone HCl 20 MG TABS Take 1 tablet (20 mg total) by mouth 3 (three) times daily as needed (for pain). 05/10/16  Yes Reed, Tiffany L, DO  promethazine (PHENERGAN)  25 MG tablet Take 25 mg by mouth every 6 (six) hours as needed for nausea or vomiting.   Yes [provider]  Soft Lens Products (SALINE SENSITIVE EYES) SOLN Place 1-2 drops into both eyes daily as needed.   Yes [provider]  temazepam (RESTORIL) 30 MG capsule Take 30 mg by mouth at bedtime. 08/29/16  Yes [provider]  triamcinolone (NASACORT ALLERGY 24HR) 55 MCG/ACT AERO nasal inhaler Place 2 sprays into the nose daily as needed (for congestion).   Yes [provider]  Vitamin D, Ergocalciferol, (DRISDOL)  50000 units CAPS capsule Take 50,000 Units by mouth once a week. 02/03/15   [provider]    Family History Family History  Problem Relation Age of Onset  . Lung cancer Mother   . COPD Mother   . Hypertension Mother   . Depression Mother   . Lymphoma Paternal Uncle   . Diabetes Maternal Grandmother        x 5 uncles  . Heart disease Unknown        Maternal and Paternal    Social History Social History  Substance Use Topics  . Smoking status: Never Smoker  . Smokeless tobacco: Never Used  . Alcohol use No     Allergies   Formaldehyde; Altace [ramipril]; Dilaudid [hydromorphone]; and Zosyn [piperacillin sod-tazobactam so]   Review of Systems Review of Systems  Constitutional: Negative for fever.  HENT: Negative for congestion and sore throat.   Eyes: Negative.   Respiratory: Positive for shortness of breath. Negative for chest tightness.   Cardiovascular: Positive for chest pain.  Gastrointestinal: Negative for abdominal pain and nausea.  Genitourinary: Negative.   Musculoskeletal: Negative for arthralgias, joint swelling and neck pain.  Skin: Negative.  Negative for rash and wound.  Neurological: Positive for light-headedness. Negative for dizziness, weakness, numbness and headaches.  Psychiatric/Behavioral: Negative.      Physical Exam Updated Vital Signs BP (!) 163/57   Pulse 100   Temp 98.3 F (36.8 C)    Resp (!) 22   Ht 5\' 2"  (1.575 m)   Wt 77.1 kg (170 lb)   SpO2 99%   BMI 31.09 kg/m   Physical Exam  Constitutional: She appears well-developed and well-nourished.  HENT:  Head: Normocephalic and atraumatic.  Eyes:  Pale conjunctiva  Neck: Normal range of motion.  Cardiovascular: Normal rate, regular rhythm, normal heart sounds and intact distal pulses.   Pulmonary/Chest: Effort normal and breath sounds normal. She has no wheezes.  Abdominal: Soft. Bowel sounds are normal. There is no tenderness.  Genitourinary:  Genitourinary Comments: Hemoccult negative.    Chaperone was present during exam.   Musculoskeletal: Normal range of motion.  Neurological: She is alert.  Skin: Skin is warm and dry.  Psychiatric: She has a normal mood and affect.  Nursing note and vitals reviewed.    ED Treatments / Results  Labs (all labs ordered are listed, but only abnormal results are displayed) Labs Reviewed  CBC WITH DIFFERENTIAL/PLATELET - Abnormal; Notable for the following:       Result Value   RBC 3.72 (*)    Hemoglobin 7.3 (*)    HCT 25.0 (*)    MCV 67.2 (*)    MCH 19.6 (*)    MCHC 29.2 (*)    RDW 20.0 (*)    All other components within normal limits  COMPREHENSIVE METABOLIC PANEL - Abnormal; Notable for the following:    Sodium 130 (*)    Chloride 100 (*)    Glucose, Bld 101 (*)    Calcium 8.5 (*)    Total Protein 6.1 (*)    Albumin 3.3 (*)    ALT 13 (*)    All other components within normal limits  RETICULOCYTES - Abnormal; Notable for the following:    RBC. 3.63 (*)    All other components within normal limits  PROTIME-INR  VITAMIN B12  FOLATE  IRON AND TIBC  FERRITIN  POC OCCULT BLOOD, ED  TYPE AND SCREEN  PREPARE RBC (CROSSMATCH)  ABO/RH    EKG  EKG Interpretation None       Radiology No results found.  Procedures Procedures (including critical care time)  Medications Ordered in ED Medications  0.9 %  sodium chloride infusion (not administered)      Initial Impression / Assessment and Plan / ED Course  I have reviewed the triage vital signs and the nursing notes.  Pertinent labs & imaging results that were available during my care of the patient were reviewed by me and considered in my medical decision making (see chart for details).     Pt with symptomatic anemia.  Discussed with Dr. Olevia Bowens who will admit pt.  Ordered transfusion of 2 units rbc's.  Pt agreeable with admission.  Final Clinical Impressions(s) / ED Diagnoses   Final diagnoses:  Microcytic anemia    New Prescriptions New Prescriptions   No medications on file     Landis Martins 05/01/17 Randal Buba, MD 05/13/17 [phone]

## 2017-05-02 ENCOUNTER — Encounter (HOSPITAL_COMMUNITY): Payer: Self-pay | Admitting: *Deleted

## 2017-05-02 ENCOUNTER — Observation Stay (HOSPITAL_COMMUNITY): Payer: Medicare Other

## 2017-05-02 DIAGNOSIS — D509 Iron deficiency anemia, unspecified: Secondary | ICD-10-CM | POA: Diagnosis not present

## 2017-05-02 DIAGNOSIS — E1169 Type 2 diabetes mellitus with other specified complication: Secondary | ICD-10-CM

## 2017-05-02 DIAGNOSIS — D649 Anemia, unspecified: Secondary | ICD-10-CM

## 2017-05-02 DIAGNOSIS — E669 Obesity, unspecified: Secondary | ICD-10-CM

## 2017-05-02 DIAGNOSIS — E871 Hypo-osmolality and hyponatremia: Secondary | ICD-10-CM

## 2017-05-02 DIAGNOSIS — I2699 Other pulmonary embolism without acute cor pulmonale: Secondary | ICD-10-CM | POA: Diagnosis not present

## 2017-05-02 DIAGNOSIS — E039 Hypothyroidism, unspecified: Secondary | ICD-10-CM | POA: Diagnosis not present

## 2017-05-02 LAB — CBC
HEMATOCRIT: 30.3 % — AB (ref 36.0–46.0)
Hemoglobin: 9.2 g/dL — ABNORMAL LOW (ref 12.0–15.0)
MCH: 22 pg — AB (ref 26.0–34.0)
MCHC: 30.4 g/dL (ref 30.0–36.0)
MCV: 72.3 fL — ABNORMAL LOW (ref 78.0–100.0)
Platelets: 153 10*3/uL (ref 150–400)
RBC: 4.19 MIL/uL (ref 3.87–5.11)
RDW: 23 % — AB (ref 11.5–15.5)
WBC: 3.9 10*3/uL — ABNORMAL LOW (ref 4.0–10.5)

## 2017-05-02 MED ORDER — FLUTICASONE PROPIONATE 50 MCG/ACT NA SUSP
2.0000 | Freq: Every day | NASAL | Status: DC | PRN
  Administered 2017-05-02: 2 via NASAL
  Filled 2017-05-02: qty 16

## 2017-05-02 MED ORDER — SODIUM CHLORIDE 0.9 % IV SOLN
25.0000 mg | Freq: Once | INTRAVENOUS | Status: DC
  Filled 2017-05-02: qty 0.5

## 2017-05-02 MED ORDER — SODIUM CHLORIDE 0.9 % IV SOLN
510.0000 mg | Freq: Once | INTRAVENOUS | Status: AC
  Administered 2017-05-02: 510 mg via INTRAVENOUS
  Filled 2017-05-02: qty 17

## 2017-05-02 NOTE — Care Management Obs Status (Signed)
[facility] NOTIFICATION   Patient Details  Name: [name] MRN: [id number] Date of Birth: 05/03/1951   Medicare Observation Status Notification Given:  Other (see comment) (pt discharged < 24hrs)    Sherald Barge, RN 05/02/2017, 1:23 PM

## 2017-05-02 NOTE — Discharge Summary (Signed)
Physician Discharge Summary  [name] NUU:[id number] DOB: 22-Nov-1950 DOA: 05/01/2017  PCP: Joya Gaskins, MD  Admit date: 05/01/2017 Discharge date: 05/02/2017  Admitted From: home Disposition:  home  Recommendations for Outpatient Follow-up:  1. Follow up with PCP in 1-2 weeks 2. Please obtain BMP/CBC in one week  Discharge Condition: stable CODE STATUS: full code Diet recommendation: Heart Healthy / Carb Modified   Brief/Interim Summary: 66 year old female with a history of pulmonary embolism in 10/2016 on anticoagulation, was admitted to the hospital with generalized weakness and progressive dyspnea on exertion. She was found to have a declining hemoglobin is 7.3. She did not have any obvious signs of bleeding. She did not notice any hematochezia or melena. Stool for occult blood was found to be negative. Iron studies indicated an iron deficiency anemia. She received 2 units of PRBCs with improvement of her hemoglobin and overall symptoms. He does not appear that she's had any acute blood loss. With low iron and low MCV, this may be more chronic process. Would recommend further workup as an outpatient. Case was discussed with her primary care physician who agreed that patient's anticoagulation should be discontinued since she has been treated for 6 months. Patient does not have a history of recurrent thromboembolism and episode that was diagnosed in 10/2016 was likely provoked by hormonal therapy. Review indicates that she was still taking estrogen prior to admission and this was also discontinued. The patient is feeling significantly improved and wants to discharge home. She is advised to return to the emergency room if she has any visible signs of bleeding, lightheadedness, worsening shortness of breath or chest pain. Repeat CBC will be requested in one week. She's been asked to follow-up with her primary care physician for further management.  Discharge Diagnoses:  Principal  Problem:   Symptomatic anemia Active Problems:   Hypothyroidism   Diabetes mellitus type 2 in obese (HCC)   Hyponatremia   Pulmonary embolism [facility])    Discharge Instructions  Discharge Instructions    Diet - low sodium heart healthy    Complete by:  As directed    Increase activity slowly    Complete by:  As directed      Allergies as of 05/02/2017      Reactions   Formaldehyde    Other reaction(s): Shortness Of Breath, coughing   Altace [ramipril] Cough   Dilaudid [hydromorphone] Itching   Drip OK, pills cause itching - can take for a short period of time but nothing extended   Zosyn [piperacillin Sod-tazobactam So] Itching, Other (See Comments)   flushing      Medication List    STOP taking these medications   apixaban 5 MG Tabs tablet Commonly known as:  ELIQUIS   estradiol 1 MG tablet Commonly known as:  ESTRACE     TAKE these medications   acetaminophen 500 MG tablet Commonly known as:  TYLENOL Take 1,000 mg by mouth every 6 (six) hours as needed for mild pain or moderate pain.   BIOTENE DRY MOUTH CARE DT Take 1 lozenge by mouth daily as needed.   cyclobenzaprine 10 MG tablet Commonly known as:  FLEXERIL Take 10 mg by mouth 3 (three) times daily as needed for muscle spasms.   diphenhydrAMINE 25 MG tablet Commonly known as:  BENADRYL Take 25 mg by mouth every 6 (six) hours as needed for itching, allergies or sleep.   gabapentin 300 MG capsule Commonly known as:  NEURONTIN Take 600 mg by mouth 3 (  three) times daily.   levothyroxine 25 MCG tablet Commonly known as:  SYNTHROID, LEVOTHROID Take 25 mcg by mouth daily before breakfast.   NASACORT ALLERGY 24HR 55 MCG/ACT Aero nasal inhaler Generic drug:  triamcinolone Place 2 sprays into the nose daily as needed (for congestion).   omeprazole 20 MG capsule Commonly known as:  PRILOSEC Take 1 capsule (20 mg total) by mouth 2 (two) times daily before a meal.   Oxycodone HCl 20 MG Tabs Take 1 tablet  (20 mg total) by mouth 3 (three) times daily as needed (for pain).   promethazine 25 MG tablet Commonly known as:  PHENERGAN Take 25 mg by mouth every 6 (six) hours as needed for nausea or vomiting.   SALINE SENSITIVE EYES Soln Place 1-2 drops into both eyes daily as needed.   temazepam 30 MG capsule Commonly known as:  RESTORIL Take 30 mg by mouth at bedtime.   Vitamin D (Ergocalciferol) 50000 units Caps capsule Commonly known as:  DRISDOL Take 50,000 Units by mouth once a week.       Allergies  Allergen Reactions  . Formaldehyde     Other reaction(s): Shortness Of Breath, coughing  . Altace [Ramipril] Cough  . Dilaudid [Hydromorphone] Itching    Drip OK, pills cause itching - can take for a short period of time but nothing extended  . Zosyn [Piperacillin Sod-Tazobactam So] Itching and Other (See Comments)    flushing    Consultations:     Procedures/Studies: US Venous Img Lower Unilateral Right  Result Date: 05/02/2017 CLINICAL DATA:  Right lower extremity edema. History of pulmonary embolism. EXAM: RIGHT LOWER EXTREMITY VENOUS DOPPLER ULTRASOUND TECHNIQUE: Gray-scale sonography with graded compression, as well as color Doppler and duplex ultrasound were performed to evaluate the lower extremity deep venous systems from the level of the common femoral vein and including the common femoral, femoral, profunda femoral, popliteal and calf veins including the posterior tibial, peroneal and gastrocnemius veins when visible. The superficial great saphenous vein was also interrogated. Spectral Doppler was utilized to evaluate flow at rest and with distal augmentation maneuvers in the common femoral, femoral and popliteal veins. COMPARISON:  02/01/2009 FINDINGS: Contralateral Common Femoral Vein: Respiratory phasicity is normal and symmetric with the symptomatic side. No evidence of thrombus. Normal compressibility. Common Femoral Vein: No evidence of thrombus. Normal compressibility,  respiratory phasicity and response to augmentation. Saphenofemoral Junction: No evidence of thrombus. Normal compressibility and flow on color Doppler imaging. Profunda Femoral Vein: No evidence of thrombus. Normal compressibility and flow on color Doppler imaging. Femoral Vein: No evidence of thrombus. Normal compressibility, respiratory phasicity and response to augmentation. Popliteal Vein: No evidence of thrombus. Normal compressibility, respiratory phasicity and response to augmentation. Calf Veins: No evidence of thrombus. Normal compressibility and flow on color Doppler imaging. Superficial Great Saphenous Vein: No evidence of thrombus. Normal compressibility and flow on color Doppler imaging. Venous Reflux:  None. Other Findings:  None. IMPRESSION: No evidence of DVT within the right lower extremity. Electronically Signed   By: Logan Bores M.D.   On: 05/02/2017 09:58    (Echo, Carotid, EGD, Colonoscopy, ERCP)    Subjective: No shortness of breath, feeling better. Generalized weakness is better.  Discharge Exam: Vitals:   05/02/17 0300 05/02/17 0840  BP: (!) 147/75 (!) 148/50  Pulse: 72 88  Resp: 18 18  Temp: 98.1 F (36.7 C)    Vitals:   05/02/17 0110 05/02/17 0247 05/02/17 0300 05/02/17 0840  BP: 113/67 (!) 154/60 (!) 147/75 Marland Kitchen)  148/50  Pulse: 72 77 72 88  Resp: 18  18 18   Temp: 97.9 F (36.6 C) 97.9 F (36.6 C) 98.1 F (36.7 C)   TempSrc: Oral Oral Oral   SpO2: 97% 100% 100%   Weight:      Height:        General: Pt is alert, awake, not in acute distress Cardiovascular: RRR, S1/S2 +, no rubs, no gallops Respiratory: CTA bilaterally, no wheezing, no rhonchi Abdominal: Soft, NT, ND, bowel sounds + Extremities: no edema, no cyanosis    The results of significant diagnostics from this hospitalization (including imaging, microbiology, ancillary and laboratory) are listed below for reference.     Microbiology: No results found for this or any previous visit (from the  past 240 hour(s)).   Labs: BNP (last 3 results) No results for input(s): BNP in the last 8760 hours. Basic Metabolic Panel:  Recent Labs Lab 05/01/17 1715  NA 130*  K 3.9  CL 100*  CO2 23  GLUCOSE 101*  BUN 12  CREATININE 0.91  CALCIUM 8.5*   Liver Function Tests:  Recent Labs Lab 05/01/17 1715  AST 15  ALT 13*  ALKPHOS 118  BILITOT 0.4  PROT 6.1*  ALBUMIN 3.3*   No results for input(s): LIPASE, AMYLASE in the last 168 hours. No results for input(s): AMMONIA in the last 168 hours. CBC:  Recent Labs Lab 05/01/17 1715 05/02/17 0615  WBC 5.4 3.9*  NEUTROABS 2.8  --   HGB 7.3* 9.2*  HCT 25.0* 30.3*  MCV 67.2* 72.3*  PLT 176 153   Cardiac Enzymes: No results for input(s): CKTOTAL, CKMB, CKMBINDEX, TROPONINI in the last 168 hours. BNP: Invalid input(s): POCBNP CBG: No results for input(s): GLUCAP in the last 168 hours. D-Dimer No results for input(s): DDIMER in the last 72 hours. Hgb A1c No results for input(s): HGBA1C in the last 72 hours. Lipid Profile No results for input(s): CHOL, HDL, LDLCALC, TRIG, CHOLHDL, LDLDIRECT in the last 72 hours. Thyroid function studies No results for input(s): TSH, T4TOTAL, T3FREE, THYROIDAB in the last 72 hours.  Invalid input(s): FREET3 Anemia work up  Recent Labs  05/01/17 1830 05/01/17 1833  VITAMINB12 191  --   FOLATE 26.2  --   FERRITIN 6*  --   TIBC 486*  --   IRON 18*  --   RETICCTPCT  --  1.4   Urinalysis    Component Value Date/Time   COLORURINE YELLOW 10/14/2016 1424   APPEARANCEUR TURBID (A) 10/14/2016 1424   LABSPEC 1.025 10/14/2016 1424   PHURINE 6.0 10/14/2016 1424   GLUCOSEU 100 (A) 10/14/2016 1424   HGBUR NEGATIVE 10/14/2016 1424   BILIRUBINUR NEGATIVE 10/14/2016 1424   KETONESUR NEGATIVE 10/14/2016 1424   PROTEINUR NEGATIVE 10/14/2016 1424   NITRITE NEGATIVE 10/14/2016 1424   LEUKOCYTESUR NEGATIVE 10/14/2016 1424   Sepsis Labs Invalid input(s): PROCALCITONIN,  WBC,   LACTICIDVEN Microbiology No results found for this or any previous visit (from the past 240 hour(s)).   Time coordinating discharge: Over 30 minutes  SIGNED:   Kathie Dike, MD  [facility] 05/02/2017, 11:58 AM Pager   If 7PM-7AM, please contact night-coverage [email or web address] Password [email or web address]

## 2017-05-02 NOTE — Care Management Note (Signed)
Case Management Note  Patient Details  Name: [name] MRN: [id number] Date of Birth: [birth year]  Subjective/Objective:                  Admitted with symptomatic anemia. Pt is from home, ind with ADL's. She has PCP, transportation and insurance with drug coverage. No CM needs anticipated.   Action/Plan: Pt discharging home today with self care.   Expected Discharge Date:  05/02/17               Expected Discharge Plan:  Home/Self Care  In-House Referral:  NA  Discharge planning Services  CM Consult  Post Acute Care Choice:  NA Choice offered to:  NA  Status of Service:  Completed, signed off  Sherald Barge, RN 05/02/2017, 1:21 PM

## 2017-05-02 NOTE — Progress Notes (Signed)
Discharge instructions read to patient .  Patient verbalized understanding of all instructions.  Discharged to home with self 

## 2017-05-02 NOTE — Progress Notes (Signed)
Pharmacy questioned does of INFED.  Spoke to Dr. Olevia Bowens earlier in shift, and he wanted to use test dose first of INFED  Before full dose given.  Pharmacy to order test dose in am. Patient otherwise stable.Will  Continue to monitor patient.

## 2017-05-02 NOTE — Progress Notes (Signed)
Two units blood given per md order.  Patient tolerated well.

## 2017-05-03 LAB — TYPE AND SCREEN
ABO/RH(D): O POS
ANTIBODY SCREEN: NEGATIVE
UNIT DIVISION: 0
UNIT DIVISION: 0

## 2017-05-03 LAB — BPAM RBC
BLOOD PRODUCT EXPIRATION DATE: 201808082359
BLOOD PRODUCT EXPIRATION DATE: 201808172359
ISSUE DATE / TIME: 201807252115
ISSUE DATE / TIME: 201807260233
UNIT TYPE AND RH: 5100
Unit Type and Rh: 5100

## 2017-05-07 ENCOUNTER — Encounter: Payer: Self-pay | Admitting: Neurology

## 2017-05-15 ENCOUNTER — Encounter: Payer: Self-pay | Admitting: Neurology

## 2017-05-15 ENCOUNTER — Ambulatory Visit (INDEPENDENT_AMBULATORY_CARE_PROVIDER_SITE_OTHER): Payer: Medicare Other | Admitting: Neurology

## 2017-05-15 VITALS — BP 184/82 | HR 76 | Ht 62.0 in | Wt 170.0 lb

## 2017-05-15 DIAGNOSIS — G8929 Other chronic pain: Secondary | ICD-10-CM | POA: Diagnosis not present

## 2017-05-15 DIAGNOSIS — M5440 Lumbago with sciatica, unspecified side: Secondary | ICD-10-CM

## 2017-05-15 DIAGNOSIS — R42 Dizziness and giddiness: Secondary | ICD-10-CM

## 2017-05-15 NOTE — Patient Instructions (Addendum)
[facility] [address] [place], [place] [address]  nquiries or Appointments: [phone]  [place]: [phone]  Vascular and pain specialist  Address: [address] [place], [place] [address]  Telephone: [place] [phone] Fax [phone]  Rosetta Posner, MD, FACS

## 2017-05-15 NOTE — Progress Notes (Signed)
Chief Complaint  Patient presents with  . Follow-up  . Dizziness    from anemia better, has had low hgb 7.4, 11.1 after 2 units iron.  . Hand Pain    has upcoming appt in 1-2 wks. w/ rheumatology.       PATIENT: [name] DOB: 1951/05/16  Chief Complaint  Patient presents with  . Follow-up  . Dizziness    from anemia better, has had low hgb 7.4, 11.1 after 2 units iron.  . Hand Pain    has upcoming appt in 1-2 wks. w/ rheumatology.      HISTORICAL  [name] is a 66 years old right-handed female, seen in refer by her primary care physician Dr.Janice Theola Sequin  for evaluation of one episode of sudden onset left-sided weakness, difficulty texting, Initial evaluation was in November 21st 2016  She had a history of hypertension, diabetes since 2012, hyperlipidemia, past history of depression, kidney stone, two lumbar decompression surgery in May 2016 by neurosurgeon Dr. Joya Salm, bilateral knee replacements, She had cervical decompression surgery in 2009, by Dr. Joya Salm, prior to surgery, she had neck pain, shooting pain to her spine,   In July 25 2015, while his she was texting using her phone, she noticed she typed the wrong letters, also noticed mild left arm weakness, when she called her aunt, she was noticed to have slurred speech, her aunt was a Equities trader, who came by to check on the patient, noticed that she was mildly weak on the left arm and leg, mild slurred speech, initial weakness last about 30 minutes, she felt tired, took a nap,  Since the incident, she continue complains of fatigue,          She also complains of gradual onset mild memory trouble, missing her routine lunch appointment with her family, tends to take the note Laboratory evaluation showed normal B12, TSH,  UPDATE Nov 09 2015: Her left side numbness weakness is getting better. She still has right lumbar radiculopathy, radiating pain has improved post lumbar decompression surgery in May  2016, getting worse after bearing weight.  She has frequent bilateral legs, left hand muscle spasm, she does have neck pain, hx of cervical fusion in the past.  She has tried valium, flexeril, without helping her symptoms  MRI of the brain in December 2016 that was normal, MRI of lumbar in 2015 prior to lumbar decompression surgery in May 2016, multi-level degenerative disc disease, most severe at L4-5, L3 and 4, with moderate to severe bilateral foraminal stenosis, mild central canal stenosis  Echocardiogram was within normal limit, ultrasound of carotid artery showed no significant abnormality  Laboratory evaluation, normal CMP, glucose 106 mild elevated, creatinine was 1.0, normal CBC, with hemoglobin of 10 point 2, mild decrease, magnesium 1.8 T1 was within normal limits 7, folic acid 8.9 R91 more than 1700, A1c was 5.4, normal thyroid functional task, TSH, cholesterol 166 LDL 23 HDL was 133, normal iron panel  Update Feb 16 2016: EMG nerve conduction study March 2017: Showed evidence of mild chronic left lumbar radiculopathy mainly involving left L4-5 myotomes. There is no evidence of right upper extremity neuropathy or right cervical radiculopathy.  She complains of chronic low back pain neck pain, radiating pain to bilateral upper extremity, had a history of cervical decompression surgery in 2009 by Dr. Joya Salm after heavy lifting, radiating pain to her spine sounds like cervical myelopathy  Trileptal 150 mg every night has helped her muscle cramping  UPDATE Sept  25th 2017: She fell twice on April 30 2016, fell at her closet at evening time, she denied confusion, no loss of consciousness, but could not reach her phone asking for help until 3 days later on May 03 2016, eventually she was able to knock over a table get to her phone to call 911, she was admitted to the hospital, was found to have hyponatremia, hyperkalemia, acute renal failure, metabolic derangements, suspected that patient  presented with a component of adrenal insufficiency with abrupt discontinuation of the steroid, she was on tapering dose of steroid by her dermatologist for skin rash at her back, with a diagnosis of lichen planus, she was discharged to rehabilitation [facility] for 2 weeks, now she is back home, she lives alone.  She has lost 33 Lbs in 6 weeks, she now complains of worsening balance, she also complains of neck pain,[place] signs,  Increased bilateral lower extremity swelling, rashes,   She has worsening bilateral lower extremity paresthesia, worsening chronic constipation, worsening urinary urgency, occasionally incontinence, we have personally reviewed MRI cervical spine May 2017: Multilevel degenerative changes, evidence of previous fusion anterior fusion E3-1,-5,-4, T1 with metallic hardware  1. At C3-4: disc bulging and uncovertebral joint hypertrophy with moderate spinal stenosis and severe biforaminal stenosis. 2. At At C4-5, C5-6, C7-T1: uncovertebral joint hypertrophy with severe biforaminal stenosis. 3. At M0-8: metallic artifact projecting posteriorly into the spinal canal; uncovertebral joint hypertrophy and facet hypertrophy with severe biforaminal stenosis.  Reviewed laboratory evaluations, A1c 5.5, CPK was elevated 545, normal CBC with hemoglobin of 11 point 5, upon admission on May 03 2016, potassium 6.0, glucose 146,sodium 129, chloride 18, GFR 17, normal Q76 195, folic acid 09.3, ferritin 48, decreased iron 21  UPDATE Feb 8th 2018: I reviewed and summarized hospital admission in January 2018, she was admitted for pulmonary emboli, she presented with frequent fall, worsening shortness of breath, she also had right hand abscess, was treated with I&D, vancomycin, discharged on by mouth doxycycline,  I personally reviewed CT head without contrast in January 2018 that was normal, CT angiogram for PE protocol showed pulmonary emboli in several proximal right upper lobe pulmonary  arterial branch, as well in the posterior segment of right upper lobe pulmonary arterial branch,  She was seen by Dr .Benetta Spar, and [facility] for evaluation of abnormal cervical spine, both seemed she is not a surgical candidate,    She complains of intermittent falling episodes, on further questioning,  she denies significant gait abnormality, but sometimes she has sudden onset unwarning falling, she described one episode shortly after a big lunch, she was standing talking with her aunt, she fell without warning signs, there was one episode of after watching TV for a few hours, she got up bending over then fell forward.   Today there was moderate orthostatic blood pressure changes, lying down blood pressure 148/70, heart rate of 90, standing up 129/87 heart rate of 110.  She had gestational diabetes, over the past few years, also developed abnormal glucose, on diet only, she has been taking Lyrica for fibromyalgia, denies significant lower extremity paresthesia, she does endorse occasionally orthostatic dizziness  UPDATE Mar 06 2017: She has not falling since last visit in Feb 2018, she complains of dizziness with neck hyperextension.  She also complains of hand pain, shooting pain in left thumb,  Multiple joints pain, bilateral ankle pain and swelling   UPDATE May 15 2017: She was admitted to the hospital lunch July 25-26 for progressive dyspnea on exertion, was  found to have declining hemoglobin 7.3, she denies signs of bleeding, she was taking anticoagulation because of pulmonary emboli in January 2018, iron deficiency anemia, she received 2 units of blood transfusion, symptoms overall has much improved, she will continue her outpatient workup, anticoagulation was discontinued.  Her dizziness overall has much improved with blood transfusion The DVT and PE was considered due to hormone treatment, she was still taking estrogen prior to the development of PE in January, which has been  discontinued.  She continue complains of bilateral lower extremity swelling, despite multiple rounds of antibiotics, was told it was not cellulitis, worry about autoimmune skin condition, rheumatologist appointment is pending  REVIEW OF SYSTEMS: Full 14 system review of systems performed and notable only for frequent falling  ALLERGIES: Allergies  Allergen Reactions  . Formaldehyde     Other reaction(s): Shortness Of Breath, coughing  . Altace [Ramipril] Cough  . Dilaudid [Hydromorphone] Itching    Drip OK, pills cause itching - can take for a short period of time but nothing extended  . Zosyn [Piperacillin Sod-Tazobactam So] Itching and Other (See Comments)    flushing    HOME MEDICATIONS: Current Outpatient Prescriptions  Medication Sig Dispense Refill  . acetaminophen (TYLENOL) 500 MG tablet Take 1,000 mg by mouth every 6 (six) hours as needed for mild pain or moderate pain.    . cyclobenzaprine (FLEXERIL) 10 MG tablet Take 10 mg by mouth 3 (three) times daily as needed for muscle spasms.    . Dentifrices (BIOTENE DRY MOUTH CARE DT) Take 1 lozenge by mouth daily as needed.    . diphenhydrAMINE (BENADRYL) 25 MG tablet Take 25 mg by mouth every 6 (six) hours as needed for itching, allergies or sleep.    Marland Kitchen estradiol (ESTRACE) 1 MG tablet Take 1 mg by mouth daily.  0  . gabapentin (NEURONTIN) 300 MG capsule Take 600 mg by mouth 3 (three) times daily.    Marland Kitchen levothyroxine (SYNTHROID, LEVOTHROID) 25 MCG tablet Take 25 mcg by mouth daily before breakfast.    . omeprazole (PRILOSEC) 20 MG capsule Take 1 capsule (20 mg total) by mouth 2 (two) times daily before a meal. 60 capsule 6  . Oxycodone HCl 20 MG TABS Take 1 tablet (20 mg total) by mouth 3 (three) times daily as needed (for pain). 90 tablet 0  . promethazine (PHENERGAN) 25 MG tablet Take 25 mg by mouth every 6 (six) hours as needed for nausea or vomiting.    . Soft Lens Products (SALINE SENSITIVE EYES) SOLN Place 1-2 drops into both  eyes daily as needed.    . temazepam (RESTORIL) 30 MG capsule Take 30 mg by mouth at bedtime.  1  . triamcinolone (NASACORT ALLERGY 24HR) 55 MCG/ACT AERO nasal inhaler Place 2 sprays into the nose daily as needed (for congestion).    . Vitamin D, Ergocalciferol, (DRISDOL) 50000 units CAPS capsule Take 50,000 Units by mouth once a week.     No current facility-administered medications for this visit.     PAST MEDICAL HISTORY: Past Medical History:  Diagnosis Date  . Anemia   . Cataracts, bilateral   . Chronic back pain    scoliosis/displacement of lumbosacral intervertebral disc/stenosis  . Depression    but doens't take any meds  . Diabetes mellitus without complication ([place])    takes Metformin daily  . Family history of adverse reaction to anesthesia    pts mom would get short of breath after anesthesia but was a smoker  .  Fibromyalgia    takes Gabapentin daily  . Fusion of lumbar spine   . GERD (gastroesophageal reflux disease)    takes Nexium daily  . Heart murmur   . Hemorrhoids   . History of blood transfusion 2013   no abnormal reaction noted  . History of bronchitis 2009  . History of colon polyps    benign  . History of hiatal hernia   . History of kidney stones    has a kidney stone now  . History of migraine    hasn't had one in over a yr  . History of surgery on arm    plates and screws  . Hypertension   . Hypothyroidism    takes Synthroid daily  . Idiopathic thrombocytopenia ([place])   . Insomnia    takes Restoril nightly as needed   . Memory loss   . Muscle spasm    takes Flexeril daily as needed  . Numbness and tingling    both toes  . PONV (postoperative nausea and vomiting)   . Shortness of breath dyspnea    with exertion but states its bc she can't exercise  . Urinary frequency   . Vitamin D deficiency     PAST SURGICAL HISTORY: Past Surgical History:  Procedure Laterality Date  . ABDOMINAL HYSTERECTOMY    . CARDIAC CATHETERIZATION  15yrs  ago  . CATARACT EXTRACTION, BILATERAL    . CERVICAL FUSION    . COLONOSCOPY    . ESOPHAGOGASTRODUODENOSCOPY    . GASTRIC BYPASS    . I&D EXTREMITY Right 10/14/2016   Procedure: IRRIGATION AND DEBRIDEMENT RIGHT HAND;  Surgeon: Dayna Barker, MD;  Location: [facility];  Service: Plastics;  Laterality: Right;  . JOINT REPLACEMENT Bilateral   . kidney stone removed    . KNEE ARTHROSCOPY     total of 7 between both knees  . left arm surgery     pins  . LUMBAR FUSION    . TONSILLECTOMY    . tummy tuck       FAMILY HISTORY: Family History  Problem Relation Age of Onset  . Lung cancer Mother   . COPD Mother   . Hypertension Mother   . Depression Mother   . Lymphoma Paternal Uncle   . Diabetes Maternal Grandmother        x 5 uncles  . Heart disease Unknown        Maternal and Paternal    SOCIAL HISTORY:  Social History   Social History  . Marital status: Single    Spouse name: N/A  . Number of children: 1  . Years of education: 36   Occupational History  . Retired Therapist, sports / Disabled    Social History Main Topics  . Smoking status: Never Smoker  . Smokeless tobacco: Never Used  . Alcohol use No  . Drug use: No  . Sexual activity: Not on file   Other Topics Concern  . Not on file   Social History Narrative   Lives at home alone.   Right handed.   Pot of coffee each morning.     PHYSICAL EXAM   Vitals:   05/15/17 1140  BP: (!) 184/82  Pulse: 76  Weight: 170 lb (77.1 kg)  Height: 5\' 2"  (1.575 m)    Not recorded    Lying down blood pressure 148/72, heart rate of 90, standing up blood pressure 129/87 heart rate of 110, standing for 5 minutes, blood pressure 145/74 heart rate of 99.  Body mass index is 31.09 kg/m.  PHYSICAL EXAMNIATION:  Gen: NAD, conversant, well nourised, obese, well groomed                     Cardiovascular: Regular rate rhythm, no peripheral edema, warm, nontender. Eyes: Conjunctivae clear without exudates or hemorrhage Neck: Supple, no  carotid bruise. Pulmonary: Clear to auscultation bilaterally   NEUROLOGICAL EXAM:  MENTAL STATUS: Speech:    Speech is normal; fluent and spontaneous with normal comprehension.  Cognition:     Orientation to time, place and person     recent and remote memory: She missed one out of 3 recalls      Normal Attention span and concentration     Normal Language, naming, repeating,spontaneous speech     Fund of knowledge   CRANIAL NERVES: CN II: Visual fields are full to confrontation. Fundoscopic exam is normal with sharp discs and no vascular changes. Pupils are round equal and briskly reactive to light. CN III, IV, VI: extraocular movement are normal. No ptosis. CN V: Facial sensation is intact to pinprick in all 3 divisions bilaterally. Corneal responses are intact.  CN VII: Face is symmetric with normal eye closure and smile. CN VIII: Hearing is normal to rubbing fingers CN IX, X: Palate elevates symmetrically. Phonation is normal. CN XI: Head turning and shoulder shrug are intact CN XII: Tongue is midline with normal movements and no atrophy.  MOTOR: No significant weakness noted, bilateral lower extremity swelling, erythematous,   REFLEXES: Reflexes are 1 and symmetric at the biceps, triceps, knees, and ankles. Plantar responses are flexor.  SENSORY: Mildly dense dependent light touch, pinprick,  and vibration sensation at toes   COORDINATION: Rapid alternating movements and fine finger movements are intact. There is no dysmetria on finger-to-nose and heel-knee-shin.    GAIT/STANCE: She needs push up to get up from seated position, cautious, stiff gait   DIAGNOSTIC DATA (LABS, IMAGING, TESTING) - I reviewed patient records, labs, notes, testing and imaging myself where available.   ASSESSMENT AND PLAN  REIGHLYN ELMES is a 66 y.o. female  Lumbar radiculopathy frequent bilateral lower extremity muscle cramping, pain   gabapentin 600 mg 3 times a day ,   She is also  receiving chronic narcotic prescription from her previous primary care physician at [facility]  moderate to severe canal stenosis at C6-7, moderate stenosis at C3-4  No evidence of cervical myelopathy on examination  Frequent falling, Dizziness with hyper extending of her neck   Likely related to orthostatic blood pressure changes, also long-standing history of abnormal glucose level, deconditioning, anemia,   Marcial Pacas, M.D. Ph.D.  [facility] [address], [place], [place] [address] Ph: [phone] Fax: [phone]  CC: Referring Provider

## 2017-05-16 ENCOUNTER — Telehealth: Payer: Self-pay | Admitting: Neurology

## 2017-05-16 NOTE — Telephone Encounter (Signed)
Patient called office in reference to needing a referral for Dr. Darlin Priestly office to be placed in [facility].  Patient seen Dr. Krista Blue yesterday.

## 2017-05-21 ENCOUNTER — Telehealth: Payer: Self-pay | Admitting: Neurology

## 2017-05-21 ENCOUNTER — Other Ambulatory Visit: Payer: Self-pay | Admitting: *Deleted

## 2017-05-21 DIAGNOSIS — M1990 Unspecified osteoarthritis, unspecified site: Secondary | ICD-10-CM

## 2017-05-21 NOTE — Telephone Encounter (Signed)
Ok, per vo by Dr. Krista Blue, to place referral for patient.

## 2017-05-21 NOTE — Telephone Encounter (Signed)
Referral sent.  To [facility].

## 2017-05-21 NOTE — Telephone Encounter (Signed)
Sharyn Lull will you place a referral for [facility] please.

## 2017-05-21 NOTE — Telephone Encounter (Signed)
Sharyn Lull RN will place order's and I will get patient scheduled.

## 2017-05-21 NOTE — Telephone Encounter (Signed)
I will send first available to [place] will you let me know about order.

## 2017-06-03 ENCOUNTER — Other Ambulatory Visit: Payer: Self-pay

## 2017-06-03 DIAGNOSIS — M7989 Other specified soft tissue disorders: Secondary | ICD-10-CM

## 2017-06-13 ENCOUNTER — Other Ambulatory Visit (HOSPITAL_COMMUNITY)
Admission: RE | Admit: 2017-06-13 | Discharge: 2017-06-13 | Disposition: A | Discharge status: Home / Self Care | Payer: Medicare Other | Source: Ambulatory Visit | Attending: Family Medicine | Admitting: Family Medicine

## 2017-06-13 DIAGNOSIS — D509 Iron deficiency anemia, unspecified: Secondary | ICD-10-CM | POA: Insufficient documentation

## 2017-06-13 LAB — CBC WITH DIFFERENTIAL/PLATELET
Basophils Absolute: 0 10*3/uL (ref 0.0–0.1)
Basophils Relative: 0 %
EOS ABS: 0.1 10*3/uL (ref 0.0–0.7)
EOS PCT: 2 %
HCT: 37 % (ref 36.0–46.0)
Hemoglobin: 11.9 g/dL — ABNORMAL LOW (ref 12.0–15.0)
LYMPHS ABS: 2.3 10*3/uL (ref 0.7–4.0)
Lymphocytes Relative: 41 %
MCH: 26.3 pg (ref 26.0–34.0)
MCHC: 32.2 g/dL (ref 30.0–36.0)
MCV: 81.7 fL (ref 78.0–100.0)
MONO ABS: 0.4 10*3/uL (ref 0.1–1.0)
MONOS PCT: 7 %
NEUTROS PCT: 50 %
Neutro Abs: 2.8 10*3/uL (ref 1.7–7.7)
PLATELETS: 139 10*3/uL — AB (ref 150–400)
RBC: 4.53 MIL/uL (ref 3.87–5.11)
WBC: 5.6 10*3/uL (ref 4.0–10.5)

## 2017-06-14 LAB — FERRITIN: Ferritin: 36 ng/mL (ref 11–307)

## 2017-06-14 LAB — IRON AND TIBC
Iron: 76 ug/dL (ref 28–170)
Saturation Ratios: 19 % (ref 10.4–31.8)
TIBC: 395 ug/dL (ref 250–450)
UIBC: 319 ug/dL

## 2017-07-16 ENCOUNTER — Ambulatory Visit (HOSPITAL_COMMUNITY)
Admission: RE | Admit: 2017-07-16 | Discharge: 2017-07-16 | Disposition: A | Discharge status: Home / Self Care | Payer: Medicare Other | Source: Ambulatory Visit | Attending: Vascular Surgery | Admitting: Vascular Surgery

## 2017-07-16 ENCOUNTER — Encounter: Payer: Self-pay | Admitting: Vascular Surgery

## 2017-07-16 ENCOUNTER — Ambulatory Visit (INDEPENDENT_AMBULATORY_CARE_PROVIDER_SITE_OTHER): Payer: Medicare Other | Admitting: Vascular Surgery

## 2017-07-16 VITALS — BP 165/79 | HR 75 | Temp 97.5°F | Resp 14 | Ht 62.0 in | Wt 174.0 lb

## 2017-07-16 DIAGNOSIS — I8391 Asymptomatic varicose veins of right lower extremity: Secondary | ICD-10-CM | POA: Insufficient documentation

## 2017-07-16 DIAGNOSIS — M7989 Other specified soft tissue disorders: Secondary | ICD-10-CM | POA: Diagnosis not present

## 2017-07-16 NOTE — Progress Notes (Signed)
[facility] [place]  Patient name: [name] MRN: [id number] DOB: 01/06/1951 Sex: female  REASON FOR CONSULT: Evaluation bilateral lower extremity swelling  HPI: SHAWNNA Mendoza is a 66 y.o. female, who is in today for evaluation of bilateral lower extremity swelling. 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 Is an unusual history in that she had the unexplained sudden onset of renal failure and was hospitalized with eventual resolution. This was just over one year ago. Since that time she's had persistent edema in both lower extremities. She does have a remote history approximate 7 years ago of bilateral knee replacement but it noted no swelling since that time. Since her episode in July 2017 she's had progressive swelling in both lower extremities. She reports this rarely goes onto the dorsum of her foot. This is typically is in the distal calf down to her ankle. Most recent creatinine in our system is normal at 0.91. She's had several duplex evaluations which showed no evidence of DVT. She is here today for further discussion. She has tried knee-high compression garments but reports that these cut into the distal posterior calves and cause discomfort. She has had no recent ulceration. She does report that during the time of her renal failure she had markedly swollen legs with weeping from both legs requiring Unna boot treatment  Past Medical History:  Diagnosis Date  . Anemia   . Cataracts, bilateral   . Chronic back pain    scoliosis/displacement of lumbosacral intervertebral disc/stenosis  . Depression    but doens't take any meds  . Diabetes mellitus without complication ([place])    takes Metformin daily  . Family history of adverse reaction to anesthesia    pts mom would get short of breath after anesthesia but was a smoker  . Fibromyalgia    takes Gabapentin daily  . Fusion of lumbar spine   . GERD (gastroesophageal  reflux disease)    takes Nexium daily  . Heart murmur   . Hemorrhoids   . History of blood transfusion 2013   no abnormal reaction noted  . History of bronchitis 2009  . History of colon polyps    benign  . History of hiatal hernia   . History of kidney stones    has a kidney stone now  . History of migraine    hasn't had one in over a yr  . History of surgery on arm    plates and screws  . Hypertension   . Hypothyroidism    takes Synthroid daily  . Idiopathic thrombocytopenia ([place])   . Insomnia    takes Restoril nightly as needed   . Memory loss   . Muscle spasm    takes Flexeril daily as needed  . Numbness and tingling    both toes  . PONV (postoperative nausea and vomiting)   . Shortness of breath dyspnea    with exertion but states its bc she can't exercise  . Urinary frequency   . Vitamin D deficiency     Family History  Problem Relation Age of Onset  . Lung cancer Mother   . COPD Mother   . Hypertension Mother   . Depression Mother   . Lymphoma Paternal Uncle   . Diabetes Maternal Grandmother        x 5 uncles  . Heart disease Unknown        Maternal and Paternal    SOCIAL HISTORY: Social History   Social  History  . Marital status: Single    Spouse name: N/A  . Number of children: 1  . Years of education: 30   Occupational History  . Retired Therapist, sports / Disabled    Social History Main Topics  . Smoking status: Never Smoker  . Smokeless tobacco: Never Used  . Alcohol use No  . Drug use: No  . Sexual activity: Not on file   Other Topics Concern  . Not on file   Social History Narrative   Lives at home alone.   Right handed.   Pot of coffee each morning.    Allergies  Allergen Reactions  . Formaldehyde Shortness Of Breath    Other reaction(s): Shortness Of Breath, coughing  . Altace [Ramipril] Cough  . Piperacillin-Tazobactam In Dex Other (See Comments)  . Dilaudid [Hydromorphone] Itching    Drip OK, pills cause itching - can take for a  short period of time but nothing extended  . Morphine Hives  . Other Itching and Other (See Comments)    flushing  . Zosyn [Piperacillin Sod-Tazobactam So] Itching and Other (See Comments)    flushing    Current Outpatient Prescriptions  Medication Sig Dispense Refill  . acetaminophen (TYLENOL) 500 MG tablet Take 1,000 mg by mouth every 6 (six) hours as needed for mild pain or moderate pain.    . Cyanocobalamin (VITAMIN B 12 PO) Take 1,000 mcg by mouth daily.    . cyclobenzaprine (FLEXERIL) 10 MG tablet Take 10 mg by mouth 3 (three) times daily as needed for muscle spasms.    . diphenhydrAMINE (BENADRYL) 25 MG tablet Take 25 mg by mouth every 6 (six) hours as needed for itching, allergies or sleep.    Marland Kitchen estradiol (ESTRACE) 1 MG tablet Take 1 mg by mouth daily.  0  . gabapentin (NEURONTIN) 300 MG capsule Take 600 mg by mouth 3 (three) times daily.    Marland Kitchen levothyroxine (SYNTHROID, LEVOTHROID) 25 MCG tablet Take 25 mcg by mouth daily before breakfast.    . omeprazole (PRILOSEC) 20 MG capsule Take 1 capsule (20 mg total) by mouth 2 (two) times daily before a meal. 60 capsule 6  . Oxycodone HCl 20 MG TABS Take 1 tablet (20 mg total) by mouth 3 (three) times daily as needed (for pain). 90 tablet 0  . promethazine (PHENERGAN) 25 MG tablet Take 25 mg by mouth every 6 (six) hours as needed for nausea or vomiting.    . Soft Lens Products (SALINE SENSITIVE EYES) SOLN Place 1-2 drops into both eyes daily as needed.    . temazepam (RESTORIL) 30 MG capsule Take 30 mg by mouth at bedtime.  1  . triamcinolone (NASACORT ALLERGY 24HR) 55 MCG/ACT AERO nasal inhaler Place 2 sprays into the nose daily as needed (for congestion).    . Vitamin D, Ergocalciferol, (DRISDOL) 50000 units CAPS capsule Take 50,000 Units by mouth once a week.    . Dentifrices (BIOTENE DRY MOUTH CARE DT) Take 1 lozenge by mouth daily as needed.     No current facility-administered medications for this visit.     REVIEW OF SYSTEMS:  [X]   denotes positive finding, [ ]  denotes negative finding Cardiac  Comments:  Chest pain or chest pressure:    Shortness of breath upon exertion:    Short of breath when lying flat:    Irregular heart rhythm:        Vascular    Pain in calf, thigh, or hip brought on by ambulation:  Pain in feet at night that wakes you up from your sleep:     Blood clot in your veins:    Leg swelling:  x       Pulmonary    Oxygen at home:    Productive cough:     Wheezing:         Neurologic    Sudden weakness in arms or legs:     Sudden numbness in arms or legs:     Sudden onset of difficulty speaking or slurred speech:    Temporary loss of vision in one eye:     Problems with dizziness:         Gastrointestinal    Blood in stool:     Vomited blood:         Genitourinary    Burning when urinating:     Blood in urine:        Psychiatric    Major depression:         Hematologic    Bleeding problems:    Problems with blood clotting too easily:        Skin    Rashes or ulcers: x       Constitutional    Fever or chills:      PHYSICAL EXAM: Vitals:   07/16/17 1520  BP: (!) 165/79  Pulse: 75  Resp: 14  Temp: (!) 97.5 F (36.4 C)  SpO2: 100%  Weight: 174 lb (78.9 kg)  Height: 5\' 2"  (1.575 m)    GENERAL: The patient is a well-nourished female, in no acute distress. The vital signs are documented above. CARDIOVASCULAR: 2+ radial and 2+ dorsalis pedis pulses bilaterally. She does have thickening and swelling on both lower extremities mostly in the distal calf down to her ankle. Mild erythema but no open ulceration. PULMONARY: There is good air exchange  ABDOMEN: Soft and non-tender  MUSCULOSKELETAL: There are no major deformities or cyanosis. NEUROLOGIC: No focal weakness or paresthesias are detected. SKIN: There are no ulcers or rashes noted. PSYCHIATRIC: The patient has a normal affect.  DATA:  Noninvasive venous study in our office today including reflux shows no evidence  of deep or superficial thrombus. She has no significant deep or superficial reflux with reflux limited only to her right popliteal vein.  MEDICAL ISSUES: Had long discussion with patient regarding this. Unclear as to the etiology of her swelling. Explained that only treatment options would be elevation and compression. I have recommended that she try thigh-high graduated compression since this would be less likely to cut into her posterior popliteal space. Also discussed possibility this is representing early lymphedema but does not extend onto her foot so for this will not be the case. Again reviewed [place] the importance of the elevation and chronic compression. She will see [name] again on as-needed basis   Rosetta Posner, MD [facility] Tel [phone] Pager [phone]

## 2017-12-31 ENCOUNTER — Encounter (HOSPITAL_COMMUNITY): Payer: Self-pay | Admitting: Cardiology

## 2017-12-31 ENCOUNTER — Emergency Department (HOSPITAL_COMMUNITY)
Admission: EM | Admit: 2017-12-31 | Discharge: 2017-12-31 | Disposition: A | Discharge status: Home / Self Care | Payer: Medicare Other | Attending: Emergency Medicine | Admitting: Emergency Medicine

## 2017-12-31 DIAGNOSIS — M549 Dorsalgia, unspecified: Secondary | ICD-10-CM | POA: Insufficient documentation

## 2017-12-31 DIAGNOSIS — Z5321 Procedure and treatment not carried out due to patient leaving prior to being seen by health care provider: Secondary | ICD-10-CM | POA: Diagnosis not present

## 2017-12-31 NOTE — ED Triage Notes (Signed)
[address] off porch Friday and hit lower back on concrete.  Also c/o being scratched by dog to left forearm.  And c/o right lower leg burning .

## 2018-01-07 ENCOUNTER — Encounter (HOSPITAL_COMMUNITY): Payer: Self-pay

## 2018-01-07 ENCOUNTER — Other Ambulatory Visit: Payer: Self-pay

## 2018-01-07 ENCOUNTER — Emergency Department (HOSPITAL_COMMUNITY): Payer: Medicare Other

## 2018-01-07 ENCOUNTER — Emergency Department (HOSPITAL_COMMUNITY)
Admission: EM | Admit: 2018-01-07 | Discharge: 2018-01-07 | Disposition: A | Discharge status: Home / Self Care | Payer: Medicare Other | Attending: Emergency Medicine | Admitting: Emergency Medicine

## 2018-01-07 DIAGNOSIS — R030 Elevated blood-pressure reading, without diagnosis of hypertension: Secondary | ICD-10-CM | POA: Diagnosis not present

## 2018-01-07 DIAGNOSIS — Y999 Unspecified external cause status: Secondary | ICD-10-CM | POA: Insufficient documentation

## 2018-01-07 DIAGNOSIS — Z23 Encounter for immunization: Secondary | ICD-10-CM | POA: Insufficient documentation

## 2018-01-07 DIAGNOSIS — Y93F9 Activity, other caregiving: Secondary | ICD-10-CM | POA: Insufficient documentation

## 2018-01-07 DIAGNOSIS — Y92018 Other place in single-family (private) house as the place of occurrence of the external cause: Secondary | ICD-10-CM | POA: Diagnosis not present

## 2018-01-07 DIAGNOSIS — M545 Low back pain, unspecified: Secondary | ICD-10-CM

## 2018-01-07 DIAGNOSIS — L089 Local infection of the skin and subcutaneous tissue, unspecified: Secondary | ICD-10-CM

## 2018-01-07 DIAGNOSIS — W19XXXA Unspecified fall, initial encounter: Secondary | ICD-10-CM

## 2018-01-07 DIAGNOSIS — G8911 Acute pain due to trauma: Secondary | ICD-10-CM | POA: Diagnosis not present

## 2018-01-07 DIAGNOSIS — E039 Hypothyroidism, unspecified: Secondary | ICD-10-CM | POA: Insufficient documentation

## 2018-01-07 DIAGNOSIS — W548XXA Other contact with dog, initial encounter: Secondary | ICD-10-CM | POA: Diagnosis not present

## 2018-01-07 DIAGNOSIS — S3992XA Unspecified injury of lower back, initial encounter: Secondary | ICD-10-CM | POA: Diagnosis present

## 2018-01-07 DIAGNOSIS — W01198A Fall on same level from slipping, tripping and stumbling with subsequent striking against other object, initial encounter: Secondary | ICD-10-CM | POA: Diagnosis not present

## 2018-01-07 DIAGNOSIS — S50911A Unspecified superficial injury of right forearm, initial encounter: Secondary | ICD-10-CM | POA: Diagnosis not present

## 2018-01-07 DIAGNOSIS — I10 Essential (primary) hypertension: Secondary | ICD-10-CM | POA: Diagnosis not present

## 2018-01-07 DIAGNOSIS — E119 Type 2 diabetes mellitus without complications: Secondary | ICD-10-CM | POA: Diagnosis not present

## 2018-01-07 MED ORDER — TETANUS-DIPHTH-ACELL PERTUSSIS 5-2.5-18.5 LF-MCG/0.5 IM SUSP
0.5000 mL | Freq: Once | INTRAMUSCULAR | Status: AC
Start: 2018-01-07 — End: 2018-01-07
  Administered 2018-01-07: 0.5 mL via INTRAMUSCULAR
  Filled 2018-01-07: qty 0.5

## 2018-01-07 MED ORDER — DOXYCYCLINE HYCLATE 100 MG PO CAPS: 100.0000 mg | ORAL_CAPSULE | Freq: Two times a day (BID) | ORAL | 0 refills | Status: AC

## 2018-01-07 MED ORDER — DOXYCYCLINE HYCLATE 100 MG PO TABS
100.0000 mg | ORAL_TABLET | Freq: Once | ORAL | Status: AC
Start: 2018-01-07 — End: 2018-01-07
  Administered 2018-01-07: 100 mg via ORAL
  Filled 2018-01-07: qty 1

## 2018-01-07 NOTE — Discharge Instructions (Addendum)
Please see the information and instructions below regarding your visit.  Your diagnoses today include:  1. Superficial skin infection   2. Acute left-sided low back pain without sciatica   3. Fall from standing, initial encounter   4. Elevated blood pressure reading with diagnosis of hypertension    Cellulitis is a superficial skin infection. Please take your antibiotics as prescribed for their ENTIRE prescribed duration.   Tests performed today include: See side panel of your discharge paperwork for testing performed today. Vital signs are listed at the bottom of these instructions.   Medications prescribed:    Take any prescribed medications only as prescribed, and any over the counter medications only as directed on the packaging.  1. Please take all of your antibiotics until finished.   You may develop abdominal discomfort or nausea from the antibiotic. If this occurs, you may take it with food. Some patients also get diarrhea with antibiotics. You may help offset this with probiotics which you can buy or get in yogurt. Do not eat or take the probiotics until 2 hours after your antibiotic. Some women develop vaginal yeast infections after antibiotics. If you develop unusual vaginal discharge after being on this medication, please see your primary care provider.   Doxycycline is an antibiotic that fights infection in the skin. This medication can make your skin sensitive to the sun, so please ensure that you wear sunscreen, hats, or other coverage over your skin while taking this. This medicine CANNOT be taken by women while pregnant, breastfeeding, or trying to become pregnant.  Please speak with a healthcare provider if any of these situations apply to you.   Some people develop allergies to antibiotics. Symptoms of antibiotic allergy can be mild and include a flat rash and itching. They can also be more serious and include:  ?Hives - Hives are raised, red patches of skin that are  usually very itchy.  ?Lip or tongue swelling  ?Trouble swallowing or breathing  ?Blistering of the skin or mouth.  If you have any of these serious symptoms, please seek emergency medical care immediately.  Home care instructions:  Please follow any educational materials contained in this packet.    Keep affected area above the level of your heart when possible. Wash area gently twice a day with warm soapy water. Do not apply alcohol or hydrogen peroxide. Cover the area if it draining or weeping.   Follow-up instructions: Please follow-up with your primary care provider or the ED in 48 hours for a check of the infection if symptoms are not improving.   Return instructions:  Please return to the Emergency Department if you experience worsening symptoms. Call your doctor sooner or return to the ER if you develop worsening signs of infection such as: increased redness, increased pain, pus, fever, or other symptoms that concern you. Please monitor the area we marked with a pen today. Please also return to the emergency department if you have any numbness in the legs, weakness in the legs, loss of bowel bladder control, numbness in your groin, or difficulty walking. Please return if you have any other emergent concerns.  Additional Information:   Your vital signs today were: BP (!) 187/66 (BP Location: Right Arm)    Pulse 93    Temp 98 F (36.7 C) (Oral)    Resp 18    Ht 5\' 2"  (1.575 m)    Wt 83.9 kg (185 lb)    SpO2 100%    BMI 33.84 kg/m  If your blood pressure (BP) was elevated on multiple readings during this visit above 130 for the top number or above 80 for the bottom number, please have this repeated by your primary care provider within one month. --------------  Thank you for allowing [name] to participate in your care today. It was a pleasure taking care of you today!

## 2018-01-07 NOTE — ED Triage Notes (Signed)
Pt has 2 separate complaints. Pt states she fell a few days ago off a porch and is having excruciating lower back pain as well as buttocks. Is getting a nerve stimulator put in on Thursday. Pt also has lots of red scratches on arms due to a dog that she thinks are getting infected.

## 2018-01-07 NOTE — ED Provider Notes (Signed)
[facility] EMERGENCY DEPARTMENT Provider Note   CSN: [id number] Arrival date & time: 01/07/18  1049     History   Chief Complaint Chief Complaint  Patient presents with  . Fall  . Arm Injury    HPI [name] is a 67 y.o. female.  HPI  Patient is a 67 year old female with a history of type 2 diabetes mellitus, cervical and lumbar fusions, depression, presenting for right arm redness as well as low back pain secondary to fall.  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  Patient reports that she was scratched by the dogs, but not bitten.  Patient reports that over the past several days she has noticed some erythema spreading around the scratches of the right antecubital fossa.  Patient denies any fevers, chills, abdominal pain, nausea, vomiting. Tdap not up to date.   Patient also noted she has had pain in a bandlike pattern across her lower back, more affecting the left side since the fall.  Patient reports she has been ambulatory.  Patient denies acute weakness or numbness of lower extremities, saddle anesthesia, loss of bowel or bladder control.  Patient reports that her pain is controlled by her current pain regimen, which is prescribed by her pain management physician.  Patient is scheduled for a nerve stimulator placement this week.  Past Medical History:  Diagnosis Date  . Anemia   . Cataracts, bilateral   . Chronic back pain    scoliosis/displacement of lumbosacral intervertebral disc/stenosis  . Depression    but doens't take any meds  . Diabetes mellitus without complication ([place])    takes Metformin daily  . Family history of adverse reaction to anesthesia    pts mom would get short of breath after anesthesia but was a smoker  . Fibromyalgia    takes Gabapentin daily  . Fusion of lumbar spine   . GERD (gastroesophageal reflux disease)    takes Nexium daily  . Heart murmur   .  Hemorrhoids   . History of blood transfusion 2013   no abnormal reaction noted  . History of bronchitis 2009  . History of colon polyps    benign  . History of hiatal hernia   . History of kidney stones    has a kidney stone now  . History of migraine    hasn't had one in over a yr  . History of surgery on arm    plates and screws  . Hypertension   . Hypothyroidism    takes Synthroid daily  . Idiopathic thrombocytopenia ([place])   . Insomnia    takes Restoril nightly as needed   . Memory loss   . Muscle spasm    takes Flexeril daily as needed  . Numbness and tingling    both toes  . PONV (postoperative nausea and vomiting)   . Shortness of breath dyspnea    with exertion but states its bc she can't exercise  . Urinary frequency   . Vitamin D deficiency     Patient Active Problem List   Diagnosis Date Noted  . Symptomatic anemia 05/01/2017  . Left hand pain 03/06/2017  . Dizziness 03/06/2017  . Pulmonary embolism ([place]) 10/09/2016  . 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 Acute renal failure (ARF) ([place])   . Fall   . Chronic low back pain   . Radiculopathy, thoracolumbar  region   . Failed back syndrome   . Anemia due to bone marrow failure ([place])   . Protein-calorie malnutrition, severe ([place]) 05/05/2016  . AKI (acute kidney injury) ([place]) 05/03/2016  . Hyperkalemia 05/03/2016  . Hypothyroidism 05/03/2016  . Hypertension 05/03/2016  . Rhabdomyolysis 05/03/2016  . Diabetes mellitus type 2 in obese ([place]) 05/03/2016  . Anemia 05/03/2016  . GERD (gastroesophageal reflux disease) 05/03/2016  . Fibromyalgia 05/03/2016  . Hyponatremia 05/03/2016  . Weakness 05/03/2016  . Neck pain 02/16/2016  . Muscle cramp 11/09/2015  . Left-sided weakness 08/29/2015  . Spondylolisthesis of lumbar region 02/08/2015    Past Surgical History:  Procedure Laterality Date  . ABDOMINAL HYSTERECTOMY    . CARDIAC CATHETERIZATION  43yrs ago  . CATARACT  EXTRACTION, BILATERAL    . CERVICAL FUSION    . COLONOSCOPY    . ESOPHAGOGASTRODUODENOSCOPY    . GASTRIC BYPASS    . I&D EXTREMITY Right 10/14/2016   Procedure: IRRIGATION AND DEBRIDEMENT RIGHT HAND;  Surgeon: Dayna Barker, MD;  Location: [facility] ORS;  Service: Plastics;  Laterality: Right;  . JOINT REPLACEMENT Bilateral   . kidney stone removed    . KNEE ARTHROSCOPY     total of 7 between both knees  . left arm surgery     pins  . LUMBAR FUSION    . TONSILLECTOMY    . tummy tuck        OB History   None      Home Medications    Prior to Admission medications   Medication Sig Start Date End Date Taking? Authorizing Provider  furosemide (LASIX) 40 MG tablet Take 1 tablet by mouth daily as needed. 07/27/13  Yes [provider]  acetaminophen (TYLENOL) 500 MG tablet Take 1,000 mg by mouth every 6 (six) hours as needed for mild pain or moderate pain.    [provider]  Cyanocobalamin (VITAMIN B 12 PO) Take 1,000 mcg by mouth daily.    [provider]  cyclobenzaprine (FLEXERIL) 10 MG tablet Take 10 mg by mouth 3 (three) times daily as needed for muscle spasms.    [provider]  Dentifrices (BIOTENE DRY MOUTH CARE DT) Take 1 lozenge by mouth daily as needed.    [provider]  diphenhydrAMINE (BENADRYL) 25 MG tablet Take 25 mg by mouth every 6 (six) hours as needed for itching, allergies or sleep.    [provider]  estradiol (ESTRACE) 1 MG tablet Take 1 mg by mouth daily. 03/27/17   [provider]  gabapentin (NEURONTIN) 300 MG capsule Take 600 mg by mouth 3 (three) times daily.    [provider]  ibuprofen (ADVIL,MOTRIN) 800 MG tablet Take 1 tablet by mouth 2 (two) times daily.    [provider]  levothyroxine (SYNTHROID, LEVOTHROID) 25 MCG tablet Take 25 mcg by mouth daily before breakfast.    [provider]  omeprazole (PRILOSEC) 20 MG capsule Take 1 capsule (20 mg total) by mouth 2 (two)  times daily before a meal. 03/01/15   Lafayette Dragon, MD  Oxycodone HCl 20 MG TABS Take 1 tablet (20 mg total) by mouth 3 (three) times daily as needed (for pain). 05/10/16   Reed, Tiffany L, DO  promethazine (PHENERGAN) 25 MG tablet Take 25 mg by mouth every 6 (six) hours as needed for nausea or vomiting.    [provider]  Soft Lens Products (SALINE SENSITIVE EYES) SOLN Place 1-2 drops into both eyes daily as needed.  [provider]  temazepam (RESTORIL) 30 MG capsule Take 30 mg by mouth at bedtime. 08/29/16   [provider]  triamcinolone (NASACORT ALLERGY 24HR) 55 MCG/ACT AERO nasal inhaler Place 2 sprays into the nose daily as needed (for congestion).    [provider]  Vitamin D, Ergocalciferol, (DRISDOL) 50000 units CAPS capsule Take 50,000 Units by mouth once a week. 02/03/15   [provider]    Family History Family History  Problem Relation Age of Onset  . Lung cancer Mother   . COPD Mother   . Hypertension Mother   . Depression Mother   . Lymphoma Paternal Uncle   . Diabetes Maternal Grandmother        x 5 uncles  . Heart disease Unknown        Maternal and Paternal    Social History Social History   Tobacco Use  . Smoking status: Never Smoker  . Smokeless tobacco: Never Used  Substance Use Topics  . Alcohol use: No    Alcohol/week: 0.0 oz  . Drug use: No     Allergies   Formaldehyde; Altace [ramipril]; Piperacillin-tazobactam in dex; Dilaudid [hydromorphone]; Morphine; Other; and Zosyn [piperacillin sod-tazobactam so]   Review of Systems Review of Systems  Constitutional: Negative for chills and fever.  Respiratory: Negative for cough and shortness of breath.   Cardiovascular: Negative for chest pain.  Gastrointestinal: Negative for abdominal pain, nausea and vomiting.  Genitourinary: Negative for difficulty urinating, flank pain and urgency.  Musculoskeletal: Positive for back pain. Negative for arthralgias,  gait problem, joint swelling and neck pain.  Skin: Positive for color change and wound.  Neurological: Negative for weakness and numbness.  All other systems reviewed and are negative.    Physical Exam Updated Vital Signs BP (!) 187/66 (BP Location: Right Arm)   Pulse 93   Temp 98 F (36.7 C) (Oral)   Resp 18   Ht 5\' 2"  (1.575 m)   Wt 83.9 kg (185 lb)   SpO2 100%   BMI 33.84 kg/m   Physical Exam  Constitutional: She appears well-developed and well-nourished. No distress.  HENT:  Head: Normocephalic and atraumatic.  Mouth/Throat: Oropharynx is clear and moist.  Eyes: Pupils are equal, round, and reactive to light. Conjunctivae and EOM are normal.  Neck: Normal range of motion. Neck supple.  Cardiovascular: Normal rate, regular rhythm, S1 normal and S2 normal.  No murmur heard. Pulmonary/Chest: Effort normal and breath sounds normal. She has no wheezes. She has no rales.  Abdominal: She exhibits no distension.  Musculoskeletal: Normal range of motion. She exhibits no edema or deformity.  Spine Exam: Inspection/Palpation: No midline tenderness to palpation of cervical, thoracic, or lumbar spine.  There is left-sided tenderness to palpation of the iliac crest. Strength: 5/5 throughout LE bilaterally (hip flexion/extension, adduction/abduction; knee flexion/extension; foot dorsiflexion/plantarflexion, inversion/eversion; great toe inversion) Sensation: Intact to light touch in proximal and distal LE bilaterally Reflexes: Difficult to elicit due to body habitus. No evidence of hyperreflexia. No clonus.  Lymphadenopathy:    She has no cervical adenopathy.  Neurological: She is alert.  Cranial nerves grossly intact. Patient moves extremities symmetrically and with good coordination.  Skin: Skin is warm and dry.  See clinical photo for details.  There is an area of erythema overlying two superficial scratches in the skin.  Erythema is not well-circumscribed.  Scratches of left arm  are not accompanied by erythema.  Psychiatric: She has a normal mood and affect. Her behavior is normal.  Judgment and thought content normal.  Nursing note and vitals reviewed.      ED Treatments / Results  Labs (all labs ordered are listed, but only abnormal results are displayed) Labs Reviewed - No data to display  EKG None  Radiology No results found.  Procedures Procedures (including critical care time)  Medications Ordered in ED Medications - No data to display   Initial Impression / Assessment and Plan / ED Course  I have reviewed the triage vital signs and the nursing notes.  Pertinent labs & imaging results that were available during my care of the patient were reviewed by me and considered in my medical decision making (see chart for details).    Patient is nontoxic-appearing, afebrile, and in no acute distress.  Patient exhibits possible early cellulitis of the right antecubital fossa secondary to the scratches.  Will provide antibiotics.  No evidence of purulent skin and soft tissue infection. Area marked for monitoring. Patient neurologically intact in lower extremities.  Will obtain x-ray of lumbar spine, bilateral hips with pelvis, and sacrum.   Radiography demonstrating diffuse degenerative changes of the lumbar spine, hips and pelvis, but no acute bony abnormalities, no fractures.  Hardware from prior fusion intact. Patient ambulatory and in no acute distress at time of discharge.  Patient instructed to monitor the site of erythema to ensure that it does not grow.  Patient instructed to return for any fevers, chills, systemic symptoms such as nausea, vomiting, worsening erythema, purulent drainage, weakness or numbness in lower extremities, loss of bowel or bladder control, saddle anesthesia.  Patient is in understanding and agrees with plan of care.  Patient noted to have hypertensive blood pressure.  Patient follow-up with a primary care provider.  Patient is on  antihypertensives at baseline.  Final Clinical Impressions(s) / ED Diagnoses   Final diagnoses:  Superficial skin infection  Acute left-sided low back pain without sciatica  Fall from standing, initial encounter  Elevated blood pressure reading with diagnosis of hypertension    ED Discharge Orders        Ordered    doxycycline (VIBRAMYCIN) 100 MG capsule  2 times daily     01/07/18 1353       Albesa Seen, PA-C 01/07/18 1519    Lajean Saver, MD 01/08/18 [phone]

## 2018-03-05 ENCOUNTER — Other Ambulatory Visit: Payer: Self-pay | Admitting: Neurology

## 2018-05-06 ENCOUNTER — Encounter: Payer: Self-pay | Admitting: Neurology

## 2018-05-06 ENCOUNTER — Ambulatory Visit (INDEPENDENT_AMBULATORY_CARE_PROVIDER_SITE_OTHER): Payer: Medicare Other | Admitting: Neurology

## 2018-05-06 ENCOUNTER — Encounter

## 2018-05-06 VITALS — BP 160/77 | HR 75 | Ht 62.0 in

## 2018-05-06 DIAGNOSIS — R51 Headache: Secondary | ICD-10-CM

## 2018-05-06 DIAGNOSIS — R519 Headache, unspecified: Secondary | ICD-10-CM | POA: Insufficient documentation

## 2018-05-06 MED ORDER — PROPRANOLOL HCL 40 MG PO TABS: 40.0000 mg | ORAL_TABLET | Freq: Two times a day (BID) | ORAL | 11 refills | Status: DC

## 2018-05-06 MED ORDER — BUTALBITAL-APAP-CAFFEINE 50-325-40 MG PO TABS: 1.0000 | ORAL_TABLET | Freq: Four times a day (QID) | ORAL | 3 refills | Status: DC | PRN

## 2018-05-06 NOTE — Progress Notes (Signed)
Chief Complaint  Patient presents with  . Fall    She had a fall at the end of April 2019.  She hit her head and had a loss of consciousness (estimating several hours).  She did not seek medical treatment and felt she must have sufferred a concussion because of her acute confusion and frequent headaches.  Her symptoms have mostly resolved with the exception of having headaches nearly daily.  She is also taking Tylenol daily.  She is unsure why she fell.       PATIENT: [name] DOB: [birth year]  Chief Complaint  Patient presents with  . Fall    She had a fall at the end of April 2019.  She hit her head and had a loss of consciousness (estimating several hours).  She did not seek medical treatment and felt she must have sufferred a concussion because of her acute confusion and frequent headaches.  Her symptoms have mostly resolved with the exception of having headaches nearly daily.  She is also taking Tylenol daily.  She is unsure why she fell.      HISTORICAL  ALAINE Mendoza is a 67 years old right-handed female, seen in refer by her primary care physician Dr.Janice Theola Sequin  for evaluation of one episode of sudden onset left-sided weakness, difficulty texting, Initial evaluation was in November 21st 2016  She had a history of hypertension, diabetes since 2012, hyperlipidemia, past history of depression, kidney stone, two lumbar decompression surgery in May 2016 by neurosurgeon Dr. Joya Salm, bilateral knee replacements, She had cervical decompression surgery in 2009, by Dr. Joya Salm, prior to surgery, she had neck pain, shooting pain to her spine,   In July 25 2015, while his she was texting using her phone, she noticed she typed the wrong letters, also noticed mild left arm weakness, when she called her aunt, she was noticed to have slurred speech, her aunt was a Equities trader, who came by to check on the patient, noticed that she was mildly weak on the left arm and leg, mild  slurred speech, initial weakness last about 30 minutes, she felt tired, took a nap,  Since the incident, she continue complains of fatigue,          She also complains of gradual onset mild memory trouble, missing her routine lunch appointment with her family, tends to take the note Laboratory evaluation showed normal B12, TSH,  UPDATE Nov 09 2015: Her left side numbness weakness is getting better. She still has right lumbar radiculopathy, radiating pain has improved post lumbar decompression surgery in May 2016, getting worse after bearing weight.  She has frequent bilateral legs, left hand muscle spasm, she does have neck pain, hx of cervical fusion in the past.  She has tried valium, flexeril, without helping her symptoms  MRI of the brain in December 2016 that was normal, MRI of lumbar in 2015 prior to lumbar decompression surgery in May 2016, multi-level degenerative disc disease, most severe at L4-5, L3 and 4, with moderate to severe bilateral foraminal stenosis, mild central canal stenosis  Echocardiogram was within normal limit, ultrasound of carotid artery showed no significant abnormality  Laboratory evaluation, normal CMP, glucose 106 mild elevated, creatinine was 1.0, normal CBC, with hemoglobin of 10 point 2, mild decrease, magnesium 1.8 T1 was within normal limits 7, folic acid 8.9 O67 more than 1700, A1c was 5.4, normal thyroid functional task, TSH, cholesterol 166 LDL 23 HDL was 133, normal iron panel  Update Feb 16 2016: EMG nerve conduction study March 2017: Showed evidence of mild chronic left lumbar radiculopathy mainly involving left L4-5 myotomes. There is no evidence of right upper extremity neuropathy or right cervical radiculopathy.  She complains of chronic low back pain neck pain, radiating pain to bilateral upper extremity, had a history of cervical decompression surgery in 2009 by Dr. Botero after heavy lifting, radiating pain to her spine sounds like cervical  myelopathy  Trileptal 150 mg every night has helped her muscle cramping  UPDATE Sept 25th 2017: She fell twice on April 30 2016, fell at her closet at evening time, she denied confusion, no loss of consciousness, but could not reach her phone asking for help until 3 days later on May 03 2016, eventually she was able to knock over a table get to her phone to call 911, she was admitted to the hospital, was found to have hyponatremia, hyperkalemia, acute renal failure, metabolic derangements, suspected that patient presented with a component of adrenal insufficiency with abrupt discontinuation of the steroid, she was on tapering dose of steroid by her dermatologist for skin rash at her back, with a diagnosis of lichen planus, she was discharged to rehabilitation [facility] for 2 weeks, now she is back home, she lives alone.  She has lost 33 Lbs in 6 weeks, she now complains of worsening balance, she also complains of neck pain,[place] signs,  Increased bilateral lower extremity swelling, rashes,   She has worsening bilateral lower extremity paresthesia, worsening chronic constipation, worsening urinary urgency, occasionally incontinence, we have personally reviewed MRI cervical spine May 2017: Multilevel degenerative changes, evidence of previous fusion anterior fusion C4-5,-6,-7, T1 with metallic hardware  1. At C3-4: disc bulging and uncovertebral joint hypertrophy with moderate spinal stenosis and severe biforaminal stenosis. 2. At At C4-5, C5-6, C7-T1: uncovertebral joint hypertrophy with severe biforaminal stenosis. 3. At C6-7: metallic artifact projecting posteriorly into the spinal canal; uncovertebral joint hypertrophy and facet hypertrophy with severe biforaminal stenosis.  Reviewed laboratory evaluations, A1c 5.5, CPK was elevated 545, normal CBC with hemoglobin of 11 point 5, upon admission on May 03 2016, potassium 6.0, glucose 146,sodium 129, chloride 18, GFR 17, normal B12 709, folic  acid 27.7, ferritin 48, decreased iron 21  UPDATE Feb 8th 2018: I reviewed and summarized hospital admission in January 2018, she was admitted for pulmonary emboli, she presented with frequent fall, worsening shortness of breath, she also had right hand abscess, was treated with I&D, vancomycin, discharged on by mouth doxycycline,  I personally reviewed CT head without contrast in January 2018 that was normal, CT angiogram for PE protocol showed pulmonary emboli in several proximal right upper lobe pulmonary arterial branch, as well in the posterior segment of right upper lobe pulmonary arterial branch,  She was seen by Dr .Bertero, and Nundkuma for evaluation of abnormal cervical spine, both seemed she is not a surgical candidate,    She complains of intermittent falling episodes, on further questioning,  she denies significant gait abnormality, but sometimes she has sudden onset unwarning falling, she described one episode shortly after a big lunch, she was standing talking with her aunt, she fell without warning signs, there was one episode of after watching TV for a few hours, she got up bending over then fell forward.   Today there was moderate orthostatic blood pressure changes, lying down blood pressure 148/70, heart rate of 90, standing up 129/87 heart rate of 110.  She had gestational diabetes, over the past few years, also   developed abnormal glucose, on diet only, she has been taking Lyrica for fibromyalgia, denies significant lower extremity paresthesia, she does endorse occasionally orthostatic dizziness  UPDATE Mar 06 2017: She has not falling since last visit in Feb 2018, she complains of dizziness with neck hyperextension.  She also complains of hand pain, shooting pain in left thumb,  Multiple joints pain, bilateral ankle pain and swelling   UPDATE May 15 2017: She was admitted to the hospital on July 25-26 for progressive dyspnea on exertion, was found to have declining  hemoglobin 7.3, she denies signs of bleeding, she was taking anticoagulation because of pulmonary emboli in January 2018, iron deficiency anemia, she received 2 units of blood transfusion, symptoms overall has much improved, she will continue her outpatient workup, anticoagulation was discontinued.  Her dizziness overall has much improved with blood transfusion The DVT and PE was considered due to hormone treatment, she was still taking estrogen prior to the development of PE in January, which has been discontinued.  She continue complains of bilateral lower extremity swelling, despite multiple rounds of antibiotics, was told it was not cellulitis, worry about autoimmune skin condition, rheumatologist appointment is pending.  UPDATE May 06 2018: She still has bilateral leg and muscle cramps, intermittent numbness of her bilateral fingers, this is following her previous cervical decompression surgery, sometimes holding her phone, woke up from overnight sleep, she felt finger numbness, has to shake her hands,  She fell on January 25, 2018, woke up from sleep in the middle of the night, fell, weak few hours later on the ground, confused, had a big bruise at her back,  Since her fall, she has constant headaches, bilateral frontal, sometimes with light noise sensitivity, she preferred to lie down in dark quiet room,  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 Formaldehyde Shortness Of Breath    Other reaction(s):  Shortness Of Breath, coughing  . Altace [Ramipril] Cough  . Piperacillin-Tazobactam In Dex Other (See Comments)  . Dilaudid [Hydromorphone] Itching    Drip OK, pills cause itching - can take for a short period of time but nothing extended  . Morphine Hives  . Other Itching and Other (See Comments)    flushing  . Zosyn [Piperacillin Sod-Tazobactam So] Itching and Other (See Comments)    flushing    HOME MEDICATIONS: Current Outpatient Medications  Medication Sig Dispense Refill  . acetaminophen (TYLENOL) 500 MG tablet Take 1,000 mg by mouth every 6 (six) hours as needed for mild pain or moderate pain.    . Cyanocobalamin (VITAMIN B 12 PO) Take 1,000 mcg by mouth daily.    . cyclobenzaprine (FLEXERIL) 10 MG tablet Take 10 mg by mouth 3 (three) times daily as needed for muscle spasms.    . furosemide (LASIX) 40 MG tablet Take 1 tablet by mouth daily as needed.    . gabapentin (NEURONTIN) 300 MG capsule Take 600 mg by mouth 3 (three) times daily.    . ibuprofen (ADVIL,MOTRIN) 800 MG tablet Take 1 tablet by mouth 2 (two) times daily.    . levothyroxine (SYNTHROID, LEVOTHROID) 25   MCG tablet Take 25 mcg by mouth daily before breakfast.    . omeprazole (PRILOSEC) 20 MG capsule Take 1 capsule (20 mg total) by mouth 2 (two) times daily before a meal. 60 capsule 6  . Oxycodone HCl 20 MG TABS Take 1 tablet (20 mg total) by mouth 3 (three) times daily as needed (for pain). 90 tablet 0  . promethazine (PHENERGAN) 25 MG tablet Take 25 mg by mouth every 6 (six) hours as needed for nausea or vomiting.    . Soft Lens Products (SALINE SENSITIVE EYES) SOLN Place 1-2 drops into both eyes daily as needed.    . temazepam (RESTORIL) 30 MG capsule Take 30 mg by mouth at bedtime.  1  . triamcinolone (NASACORT ALLERGY 24HR) 55 MCG/ACT AERO nasal inhaler Place 2 sprays into the nose daily as needed (for congestion).    . Vitamin D, Ergocalciferol, (DRISDOL) 50000 units CAPS capsule Take 50,000 Units by mouth once a  week.     No current facility-administered medications for this visit.     PAST MEDICAL HISTORY: Past Medical History:  Diagnosis Date  . Anemia   . Cataracts, bilateral   . Chronic back pain    scoliosis/displacement of lumbosacral intervertebral disc/stenosis  . Depression    but doens't take any meds  . Diabetes mellitus without complication (HCC)    takes Metformin daily  . Family history of adverse reaction to anesthesia    pts mom would get short of breath after anesthesia but was a smoker  . Fibromyalgia    takes Gabapentin daily  . Fusion of lumbar spine   . GERD (gastroesophageal reflux disease)    takes Nexium daily  . Heart murmur   . Hemorrhoids   . History of blood transfusion 2013   no abnormal reaction noted  . History of bronchitis 2009  . History of colon polyps    benign  . History of hiatal hernia   . History of kidney stones    has a kidney stone now  . History of migraine    hasn't had one in over a yr  . History of surgery on arm    plates and screws  . Hypertension   . Hypothyroidism    takes Synthroid daily  . Idiopathic thrombocytopenia (HCC)   . Insomnia    takes Restoril nightly as needed   . Memory loss   . Muscle spasm    takes Flexeril daily as needed  . Numbness and tingling    both toes  . PONV (postoperative nausea and vomiting)   . Shortness of breath dyspnea    with exertion but states its bc she can't exercise  . Urinary frequency   . Vitamin D deficiency     PAST SURGICAL HISTORY: Past Surgical History:  Procedure Laterality Date  . ABDOMINAL HYSTERECTOMY    . CARDIAC CATHETERIZATION  15yrs ago  . CATARACT EXTRACTION, BILATERAL    . CERVICAL FUSION    . COLONOSCOPY    . ESOPHAGOGASTRODUODENOSCOPY    . GASTRIC BYPASS    . I&D EXTREMITY Right 10/14/2016   Procedure: IRRIGATION AND DEBRIDEMENT RIGHT HAND;  Surgeon: Harrill Coley, MD;  Location: [facility];  Service: Plastics;  Laterality: Right;  . JOINT REPLACEMENT  Bilateral   . kidney stone removed    . KNEE ARTHROSCOPY     total of 7 between both knees  . left arm surgery     pins  . LUMBAR FUSION    .   SPINAL CORD STIMULATOR IMPLANT    . TONSILLECTOMY    . tummy tuck       FAMILY HISTORY: Family History  Problem Relation Age of Onset  . Lung cancer Mother   . COPD Mother   . Hypertension Mother   . Depression Mother   . Lymphoma Paternal Uncle   . Diabetes Maternal Grandmother        x 5 uncles  . Heart disease Unknown        Maternal and Paternal    SOCIAL HISTORY:  Social History   Socioeconomic History  . Marital status: Single    Spouse name: Not on file  . Number of children: 1  . Years of education: 16  . Highest education level: Not on file  Occupational History  . Occupation: Retired RN / Disabled  Social Needs  . Financial resource strain: Not on file  . Food insecurity:    Worry: Not on file    Inability: Not on file  . Transportation needs:    Medical: Not on file    Non-medical: Not on file  Tobacco Use  . Smoking status: Never Smoker  . Smokeless tobacco: Never Used  Substance and Sexual Activity  . Alcohol use: No    Alcohol/week: 0.0 oz  . Drug use: No  . Sexual activity: Not on file  Lifestyle  . Physical activity:    Days per week: Not on file    Minutes per session: Not on file  . Stress: Not on file  Relationships  . Social connections:    Talks on phone: Not on file    Gets together: Not on file    Attends religious service: Not on file    Active member of club or organization: Not on file    Attends meetings of clubs or organizations: Not on file    Relationship status: Not on file  . Intimate partner violence:    Fear of current or ex partner: Not on file    Emotionally abused: Not on file    Physically abused: Not on file    Forced sexual activity: Not on file  Other Topics Concern  . Not on file  Social History Narrative   Lives at home alone.   Right handed.   Pot of coffee  each morning.     PHYSICAL EXAM   Vitals:   05/06/18 0909  BP: (!) 160/77  Pulse: 75  Height: 5' 2" (1.575 m)    Not recorded    Lying down blood pressure 148/72, heart rate of 90, standing up blood pressure 129/87 heart rate of 110, standing for 5 minutes, blood pressure 145/74 heart rate of 99.  Body mass index is 33.84 kg/m.  PHYSICAL EXAMNIATION:  Gen: NAD, conversant, well nourised, obese, well groomed                     Cardiovascular: Regular rate rhythm, no peripheral edema, warm, nontender. Eyes: Conjunctivae clear without exudates or hemorrhage Neck: Supple, no carotid bruise. Pulmonary: Clear to auscultation bilaterally   NEUROLOGICAL EXAM:  MENTAL STATUS: Speech:    Speech is normal; fluent and spontaneous with normal comprehension.  Cognition:     Orientation to time, place and person     recent and remote memory: She missed one out of 3 recalls      Normal Attention span and concentration     Normal Language, naming, repeating,spontaneous speech     Fund of   knowledge   CRANIAL NERVES: CN II: Visual fields are full to confrontation. Fundoscopic exam is normal with sharp discs and no vascular changes. Pupils are round equal and briskly reactive to light. CN III, IV, VI: extraocular movement are normal. No ptosis. CN V: Facial sensation is intact to pinprick in all 3 divisions bilaterally. Corneal responses are intact.  CN VII: Face is symmetric with normal eye closure and smile. CN VIII: Hearing is normal to rubbing fingers CN IX, X: Palate elevates symmetrically. Phonation is normal. CN XI: Head turning and shoulder shrug are intact CN XII: Tongue is midline with normal movements and no atrophy.  MOTOR: No significant weakness noted, bilateral lower extremity swelling, erythematous,   REFLEXES: Reflexes are 1 and symmetric at the biceps, triceps, knees, and ankles. Plantar responses are flexor.  SENSORY: Mildly dense dependent light touch,  pinprick,  and vibration sensation at toes   COORDINATION: Rapid alternating movements and fine finger movements are intact. There is no dysmetria on finger-to-nose and heel-knee-shin.    GAIT/STANCE: She needs push up to get up from seated position, cautious, stiff gait   DIAGNOSTIC DATA (LABS, IMAGING, TESTING) - I reviewed patient records, labs, notes, testing and imaging myself where available.   ASSESSMENT AND PLAN  ARLICIA PAQUETTE is a 67 y.o. female   Fall on January 25 2018, persistent headaches,  Potentially medication side effect,  EEG  CT head without contrast  ESR C-reactive protein to rule out temporal arteritis  Lumbar radiculopathy frequent bilateral lower extremity muscle cramping, pain   gabapentin 600 mg 3 times a day ,   She is also receiving chronic narcotic prescription from her previous primary care physician at [facility] 3 times a day    Marcial Pacas, M.D. Ph.D.  [facility] [address], [place], [place] [address] Ph: [phone] Fax: [phone]  CC: Referring Provider

## 2018-05-07 LAB — C-REACTIVE PROTEIN: CRP: 4 mg/L (ref 0–10)

## 2018-05-07 LAB — SEDIMENTATION RATE: SED RATE: 4 mm/h (ref 0–40)

## 2018-05-08 ENCOUNTER — Ambulatory Visit (INDEPENDENT_AMBULATORY_CARE_PROVIDER_SITE_OTHER): Payer: Medicare Other | Admitting: Neurology

## 2018-05-08 DIAGNOSIS — R51 Headache: Secondary | ICD-10-CM

## 2018-05-08 DIAGNOSIS — R519 Headache, unspecified: Secondary | ICD-10-CM

## 2018-05-14 NOTE — Procedures (Signed)
   HISTORY: 66 year old female, with history of fall, hit her head, with transient loss of consciousness  TECHNIQUE:  16 channel EEG was performed based on standard 10-16 international system. One channel was dedicated to EKG, which has demonstrates normal sinus rhythm of 66 beats per minutes.  Upon awakening, the posterior background activity was well-developed, in alpha range, reactive to eye opening and closure.  There was no evidence of epileptiform discharge.  Photic stimulation was performed, which induced a symmetric photic driving.  Hyperventilation was performed, there was no abnormality elicit.  No sleep was achieved.  CONCLUSION: This is a  normal awake EEG.  There is no electrodiagnostic evidence of epileptiform discharge.  Marcial Pacas, M.D. Ph.D.  [facility] [place], [place] [address] Phone: [phone] Fax:      [phone]

## 2018-05-23 ENCOUNTER — Ambulatory Visit
Admission: RE | Admit: 2018-05-23 | Discharge: 2018-05-23 | Disposition: A | Discharge status: Home / Self Care | Payer: Medicare Other | Source: Ambulatory Visit | Attending: Neurology | Admitting: Neurology

## 2018-05-23 DIAGNOSIS — R519 Headache, unspecified: Secondary | ICD-10-CM

## 2018-05-23 DIAGNOSIS — R51 Headache: Principal | ICD-10-CM

## 2018-07-23 ENCOUNTER — Ambulatory Visit: Payer: Medicare Other | Admitting: Neurology

## 2018-09-29 ENCOUNTER — Encounter: Payer: Self-pay | Admitting: Neurology

## 2018-09-29 ENCOUNTER — Ambulatory Visit (INDEPENDENT_AMBULATORY_CARE_PROVIDER_SITE_OTHER): Payer: Medicare Other | Admitting: Neurology

## 2018-09-29 VITALS — BP 164/84 | HR 96 | Ht 62.0 in

## 2018-09-29 DIAGNOSIS — R51 Headache: Secondary | ICD-10-CM

## 2018-09-29 DIAGNOSIS — R519 Headache, unspecified: Secondary | ICD-10-CM | POA: Insufficient documentation

## 2018-09-29 DIAGNOSIS — G8929 Other chronic pain: Secondary | ICD-10-CM

## 2018-09-29 MED ORDER — PROPRANOLOL HCL 40 MG PO TABS: 40.0000 mg | ORAL_TABLET | Freq: Two times a day (BID) | ORAL | 11 refills | Status: DC

## 2018-09-29 MED ORDER — BUTALBITAL-APAP-CAFFEINE 50-325-40 MG PO TABS: 1.0000 | ORAL_TABLET | Freq: Four times a day (QID) | ORAL | 5 refills | Status: DC | PRN

## 2018-09-29 MED ORDER — ERENUMAB-AOOE 70 MG/ML ~~LOC~~ SOAJ: 70.0000 mg | SUBCUTANEOUS | 11 refills | Status: DC

## 2018-09-29 NOTE — Progress Notes (Signed)
Chief Complaint  Patient presents with  . Headache    Reports a return of headaches.  She stopped propranolol not realizing it was an ongoing preventive medication.  She also needs refills of Fioricet.  Feels is works well for her pain.  . Lumbar Radiculopathy    She has continued managing her symptoms with gabapentin, cyclobenzaprine and oxycodone.      PATIENT: [name] DOB: 05-21-1951  Chief Complaint  Patient presents with  . Headache    Reports a return of headaches.  She stopped propranolol not realizing it was an ongoing preventive medication.  She also needs refills of Fioricet.  Feels is works well for her pain.  . Lumbar Radiculopathy    She has continued managing her symptoms with gabapentin, cyclobenzaprine and oxycodone.     HISTORICAL  [name] is a 67 years old right-handed female, seen in refer by her primary care physician Dr.Janice Theola Sequin  for evaluation of one episode of sudden onset left-sided weakness, difficulty texting, Initial evaluation was in November 21st 2016.  She had a history of hypertension, diabetes since 2012, hyperlipidemia, past history of depression, kidney stone, two lumbar decompression surgery in May 2016 by neurosurgeon Dr. Joya Salm, bilateral knee replacements, She had cervical decompression surgery in 2009, by Dr. Joya Salm, prior to surgery, she had neck pain, shooting pain to her spine,   In July 25 2015, while his she was texting using her phone, she noticed she typed the wrong letters, also noticed mild left arm weakness, when she called her aunt, she was noticed to have slurred speech, her aunt was a Equities trader, who came by to check on the patient, noticed that she was mildly weak on the left arm and leg, mild slurred speech, initial weakness last about 30 minutes, she felt tired, took a nap,  Since the incident, she continue complains of fatigue,          She also complains of gradual onset mild memory trouble,  missing her routine lunch appointment with her family, tends to take the note Laboratory evaluation showed normal B12, TSH,  UPDATE Nov 09 2015: Her left side numbness weakness is getting better. She still has right lumbar radiculopathy, radiating pain has improved post lumbar decompression surgery in May 2016, getting worse after bearing weight.  She has frequent bilateral legs, left hand muscle spasm, she does have neck pain, hx of cervical fusion in the past.  She has tried valium, flexeril, without helping her symptoms  MRI of the brain in December 2016 that was normal, MRI of lumbar in 2015 prior to lumbar decompression surgery in May 2016, multi-level degenerative disc disease, most severe at L4-5, L3 and 4, with moderate to severe bilateral foraminal stenosis, mild central canal stenosis  Echocardiogram was within normal limit, ultrasound of carotid artery showed no significant abnormality  Laboratory evaluation, normal CMP, glucose 106 mild elevated, creatinine was 1.0, normal CBC, with hemoglobin of 10 point 2, mild decrease, magnesium 1.8 T1 was within normal limits 7, folic acid 8.9 L87 more than 1700, A1c was 5.4, normal thyroid functional task, TSH, cholesterol 166 LDL 23 HDL was 133, normal iron panel  Update Feb 16 2016: EMG nerve conduction study March 2017: Showed evidence of mild chronic left lumbar radiculopathy mainly involving left L4-5 myotomes. There is no evidence of right upper extremity neuropathy or right cervical radiculopathy.  She complains of chronic low back pain neck pain, radiating pain to bilateral upper extremity, had a  history of cervical decompression surgery in 2009 by Dr. Joya Salm after heavy lifting, radiating pain to her spine sounds like cervical myelopathy  Trileptal 150 mg every night has helped her muscle cramping  UPDATE Sept 25th 2017: She fell twice on April 30 2016, fell at her closet at evening time, she denied confusion, no loss of consciousness,  but could not reach her phone asking for help until 3 days later on May 03 2016, eventually she was able to knock over a table get to her phone to call 911, she was admitted to the hospital, was found to have hyponatremia, hyperkalemia, acute renal failure, metabolic derangements, suspected that patient presented with a component of adrenal insufficiency with abrupt discontinuation of the steroid, she was on tapering dose of steroid by her dermatologist for skin rash at her back, with a diagnosis of lichen planus, she was discharged to [facility] for 2 weeks, now she is back home, she lives alone.  She has lost 33 Lbs in 6 weeks, she now complains of worsening balance, she also complains of neck pain,[place] signs,  Increased bilateral lower extremity swelling, rashes,   She has worsening bilateral lower extremity paresthesia, worsening chronic constipation, worsening urinary urgency, occasionally incontinence, we have personally reviewed MRI cervical spine May 2017: Multilevel degenerative changes, evidence of previous fusion anterior fusion Z6-0,-1,-0, T1 with metallic hardware  1. At C3-4: disc bulging and uncovertebral joint hypertrophy with moderate spinal stenosis and severe biforaminal stenosis. 2. At At C4-5, C5-6, C7-T1: uncovertebral joint hypertrophy with severe biforaminal stenosis. 3. At X3-2: metallic artifact projecting posteriorly into the spinal canal; uncovertebral joint hypertrophy and facet hypertrophy with severe biforaminal stenosis.  Reviewed laboratory evaluations, A1c 5.5, CPK was elevated 545, normal CBC with hemoglobin of 11 point 5, upon admission on May 03 2016, potassium 6.0, glucose 146,sodium 129, chloride 18, GFR 17, normal T55 732, folic acid 20.2, ferritin 48, decreased iron 21  UPDATE Feb 8th 2018: I reviewed and summarized hospital admission in January 2018, she was admitted for pulmonary emboli, she presented with frequent fall, worsening  shortness of breath, she also had right hand abscess, was treated with I&D, vancomycin, discharged on by mouth doxycycline,  I personally reviewed CT head without contrast in January 2018 that was normal, CT angiogram for PE protocol showed pulmonary emboli in several proximal right upper lobe pulmonary arterial branch, as well in the posterior segment of right upper lobe pulmonary arterial branch,  She was seen by Dr .Benetta Spar, and [facility] for evaluation of abnormal cervical spine, both seemed she is not a surgical candidate,    She complains of intermittent falling episodes, on further questioning,  she denies significant gait abnormality, but sometimes she has sudden onset unwarning falling, she described one episode shortly after a big lunch, she was standing talking with her aunt, she fell without warning signs, there was one episode of after watching TV for a few hours, she got up bending over then fell forward.   Today there was moderate orthostatic blood pressure changes, lying down blood pressure 148/70, heart rate of 90, standing up 129/87 heart rate of 110.  She had gestational diabetes, over the past few years, also developed abnormal glucose, on diet only, she has been taking Lyrica for fibromyalgia, denies significant lower extremity paresthesia, she does endorse occasionally orthostatic dizziness  UPDATE Mar 06 2017: She has not falling since last visit in Feb 2018, she complains of dizziness with neck hyperextension.  She also complains of hand  pain, shooting pain in left thumb,  Multiple joints pain, bilateral ankle pain and swelling   UPDATE May 15 2017: She was admitted to the hospital on July 25-26 for progressive dyspnea on exertion, was found to have declining hemoglobin 7.3, she denies signs of bleeding, she was taking anticoagulation because of pulmonary emboli in January 2018, iron deficiency anemia, she received 2 units of blood transfusion, symptoms overall has much  improved, she will continue her outpatient workup, anticoagulation was discontinued.  Her dizziness overall has much improved with blood transfusion The DVT and PE was considered due to hormone treatment, she was still taking estrogen prior to the development of PE in January, which has been discontinued.  She continue complains of bilateral lower extremity swelling, despite multiple rounds of antibiotics, was told it was not cellulitis, worry about autoimmune skin condition, rheumatologist appointment is pending.  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  Chronic calcified change at bilateral basal ganglion, no significant change compared to previous scan in January 2018  EEG was normal on May 08, 2018.  REVIEW OF SYSTEMS: Full 14 system review of systems performed and notable only for headache All rest review of systems were negative.  ALLERGIES: Allergies  Allergen Reactions  . Formaldehyde Shortness Of  Breath    Other reaction(s): Shortness Of Breath, coughing  . Altace [Ramipril] Cough  . Piperacillin-Tazobactam In Dex Other (See Comments)  . Dilaudid [Hydromorphone] Itching    Drip OK, pills cause itching - can take for a short period of time but nothing extended  . Morphine Hives  . Other Itching and Other (See Comments)    flushing  . Zosyn [Piperacillin Sod-Tazobactam So] Itching and Other (See Comments)    flushing    HOME MEDICATIONS: Current Outpatient Medications  Medication Sig Dispense Refill  . acetaminophen (TYLENOL) 500 MG tablet Take 1,000 mg by mouth every 6 (six) hours as needed for mild pain or moderate pain.    . butalbital-acetaminophen-caffeine (FIORICET, ESGIC) 50-325-40 MG tablet Take 1 tablet by mouth every 6 (six) hours as needed for headache. Do not refill in less than 30 days 12 tablet 3  . Cyanocobalamin (VITAMIN B 12 PO) Take 1,000 mcg by mouth daily.    . cyclobenzaprine (FLEXERIL) 10 MG tablet Take 10 mg by mouth 3 (three) times daily as needed for muscle spasms.    . furosemide (LASIX) 40 MG tablet Take 1 tablet by mouth daily as needed.    . gabapentin (NEURONTIN) 300 MG capsule Take 600 mg  by mouth 3 (three) times daily.    Marland Kitchen ibuprofen (ADVIL,MOTRIN) 800 MG tablet Take 1 tablet by mouth 2 (two) times daily.    Marland Kitchen levothyroxine (SYNTHROID, LEVOTHROID) 25 MCG tablet Take 25 mcg by mouth daily before breakfast.    . omeprazole (PRILOSEC) 20 MG capsule Take 1 capsule (20 mg total) by mouth 2 (two) times daily before a meal. 60 capsule 6  . Oxycodone HCl 20 MG TABS Take 1 tablet (20 mg total) by mouth 3 (three) times daily as needed (for pain). 90 tablet 0  . promethazine (PHENERGAN) 25 MG tablet Take 25 mg by mouth every 6 (six) hours as needed for nausea or vomiting.    . propranolol (INDERAL) 40 MG tablet Take 1 tablet (40 mg total) by mouth 2 (two) times daily. 60 tablet 11  . Soft Lens Products (SALINE SENSITIVE EYES) SOLN Place 1-2 drops into both eyes  daily as needed.    . temazepam (RESTORIL) 30 MG capsule Take 30 mg by mouth at bedtime.  1  . triamcinolone (NASACORT ALLERGY 24HR) 55 MCG/ACT AERO nasal inhaler Place 2 sprays into the nose daily as needed (for congestion).    . Vitamin D, Ergocalciferol, (DRISDOL) 50000 units CAPS capsule Take 50,000 Units by mouth 2 (two) times a week.      No current facility-administered medications for this visit.     PAST MEDICAL HISTORY: Past Medical History:  Diagnosis Date  . Anemia   . Cataracts, bilateral   . Chronic back pain    scoliosis/displacement of lumbosacral intervertebral disc/stenosis  . Depression    but doens't take any meds  . Diabetes mellitus without complication ([place])    takes Metformin daily  . Family history of adverse reaction to anesthesia    pts mom would get short of breath after anesthesia but was a smoker  . Fibromyalgia    takes Gabapentin daily  . Fusion of lumbar spine   . GERD (gastroesophageal reflux disease)    takes Nexium daily  . Heart murmur   . Hemorrhoids   . History of blood transfusion 2013   no abnormal reaction noted  . History of bronchitis 2009  . History of colon polyps    benign  . History of hiatal hernia   . History of kidney stones    has a kidney stone now  . History of migraine    hasn't had one in over a yr  . History of surgery on arm    plates and screws  . Hypertension   . Hypothyroidism    takes Synthroid daily  . Idiopathic thrombocytopenia ([place])   . Insomnia    takes Restoril nightly as needed   . Memory loss   . Muscle spasm    takes Flexeril daily as needed  . Numbness and tingling    both toes  . PONV (postoperative nausea and vomiting)   . Shortness of breath dyspnea    with exertion but states its bc she can't exercise  . Urinary frequency   . Vitamin D deficiency     PAST SURGICAL HISTORY: Past Surgical History:  Procedure Laterality Date  . ABDOMINAL HYSTERECTOMY    . CARDIAC CATHETERIZATION   29yrs ago  . CATARACT EXTRACTION, BILATERAL    . CERVICAL FUSION    . COLONOSCOPY    . ESOPHAGOGASTRODUODENOSCOPY    . GASTRIC BYPASS    . I&D EXTREMITY Right 10/14/2016   Procedure: IRRIGATION AND DEBRIDEMENT RIGHT HAND;  Surgeon:  Dayna Barker, MD;  Location: [facility];  Service: Plastics;  Laterality: Right;  . JOINT REPLACEMENT Bilateral   . kidney stone removed    . KNEE ARTHROSCOPY     total of 7 between both knees  . left arm surgery     pins  . LUMBAR FUSION    . SPINAL CORD STIMULATOR IMPLANT    . TONSILLECTOMY    . tummy tuck       FAMILY HISTORY: Family History  Problem Relation Age of Onset  . Lung cancer Mother   . COPD Mother   . Hypertension Mother   . Depression Mother   . Lymphoma Paternal Uncle   . Diabetes Maternal Grandmother        x 5 uncles  . Heart disease Unknown        Maternal and Paternal    SOCIAL HISTORY:  Social History   Socioeconomic History  . Marital status: Single    Spouse name: Not on file  . Number of children: 1  . Years of education: 74  . Highest education level: Not on file  Occupational History  . Occupation: Retired Therapist, sports / Disabled  Social Needs  . Financial resource strain: Not on file  . Food insecurity:    Worry: Not on file    Inability: Not on file  . Transportation needs:    Medical: Not on file    Non-medical: Not on file  Tobacco Use  . Smoking status: Never Smoker  . Smokeless tobacco: Never Used  Substance and Sexual Activity  . Alcohol use: No    Alcohol/week: 0.0 standard drinks  . Drug use: No  . Sexual activity: Not on file  Lifestyle  . Physical activity:    Days per week: Not on file    Minutes per session: Not on file  . Stress: Not on file  Relationships  . Social connections:    Talks on phone: Not on file    Gets together: Not on file    Attends religious service: Not on file    Active member of club or organization: Not on file    Attends meetings of clubs or organizations: Not on file      Relationship status: Not on file  . Intimate partner violence:    Fear of current or ex partner: Not on file    Emotionally abused: Not on file    Physically abused: Not on file    Forced sexual activity: Not on file  Other Topics Concern  . Not on file  Social History Narrative   Lives at home alone.   Right handed.   Pot of coffee each morning.     PHYSICAL EXAM   Vitals:   09/29/18 0919  BP: (!) 164/84  Pulse: 96  Height: 5\' 2"  (1.575 m)    Body mass index is 33.84 kg/m.  PHYSICAL EXAMNIATION:  Gen: NAD, conversant, well nourised, obese, well groomed                     Cardiovascular: Regular rate rhythm, no peripheral edema, warm, nontender. Eyes: Conjunctivae clear without exudates or hemorrhage Neck: Supple, no carotid bruise. Pulmonary: Clear to auscultation bilaterally   NEUROLOGICAL EXAM:  MENTAL STATUS: Speech:    Speech is normal; fluent and spontaneous with normal comprehension.  Cognition:     Orientation to time, place and person     recent and remote memory: She missed one out of 3 recalls  Normal Attention span and concentration     Normal Language, naming, repeating,spontaneous speech     Fund of knowledge   CRANIAL NERVES: CN II: Visual fields are full to confrontation. Fundoscopic exam is normal with sharp discs and no vascular changes. Pupils are round equal and briskly reactive to light. CN III, IV, VI: extraocular movement are normal. No ptosis. CN V: Facial sensation is intact to pinprick in all 3 divisions bilaterally. Corneal responses are intact.  CN VII: Face is symmetric with normal eye closure and smile. CN VIII: Hearing is normal to rubbing fingers CN IX, X: Palate elevates symmetrically. Phonation is normal. CN XI: Head turning and shoulder shrug are intact CN XII: Tongue is midline with normal movements and no atrophy.  MOTOR: No significant weakness noted, bilateral lower extremity swelling, erythematous,    REFLEXES: Reflexes are 1 and symmetric at the biceps, triceps, knees, and ankles. Plantar responses are flexor.  COORDINATION: Rapid alternating movements and fine finger movements are intact. There is no dysmetria on finger-to-nose and heel-knee-shin.    GAIT/STANCE: Steady   DIAGNOSTIC DATA (LABS, IMAGING, TESTING) - I reviewed patient records, labs, notes, testing and imaging myself where available.   ASSESSMENT AND PLAN  [name] is a 67 y.o. female   Fall on January 25 2018, persistent headaches,  History of migraine, persistent daily headache has some migraine features, also worsened by her daily large dose of Tylenol use, a medicine rebound component.  Continue propanolol 40 mg twice a day, gabapentin 300 mg 6 tablets 3 times a day as preventive medications,  Add on aimovig 70 mg once a months as a preventive medication   advised her stop the daily Tylenol use  Fioricet as needed (if she continues to complains of frequent headache after stopping daily Tylenol, may offer BOTOX as preventive medications)    Marcial Pacas, M.D. Ph.D.  [facility] [address] [place], [place] [address] Ph: [phone] Fax: [phone]  CC: Referring Provider

## 2018-10-03 ENCOUNTER — Telehealth: Payer: Self-pay | Admitting: *Deleted

## 2018-10-03 NOTE — Telephone Encounter (Addendum)
PA for Aimovig started via covermymeds (key: [id number]).  She has coverage with [employer] [phone]).  Pt [id number].  Decision pending.

## 2018-10-03 NOTE — Telephone Encounter (Signed)
PA approved through 01/01/2019.

## 2018-10-05 IMAGING — CT CT L SPINE W/O CM
1 of 7 series · 5 of 14 positions shown, 7 images · non-contrast
Comparison: Lumbar spine myelogram 01/17/2015

CLINICAL DATA: Low back pain and left leg pain.

EXAM:
CT LUMBAR SPINE WITHOUT CONTRAST
TECHNIQUE: Multidetector CT imaging of the lumbar spine was performed without
intravenous contrast administration. Multiplanar CT image
reconstructions were also generated.

[Series 3: l spine soft · axial · 0.27mm/px · z∈[-256,-112]mm · 5 of 72 slices shown, 7 images]
[im 12/72  soft-tissue]
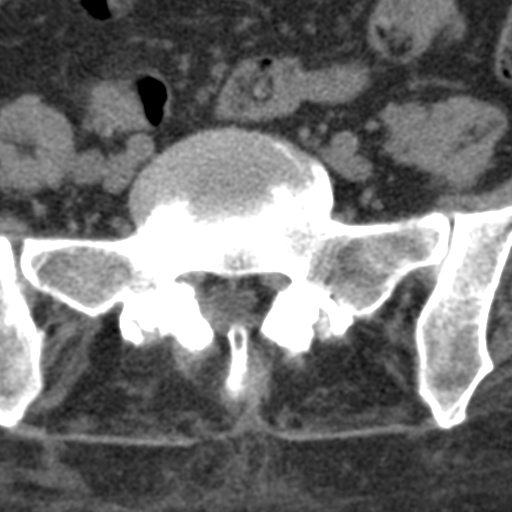
[im 12/72  bone]
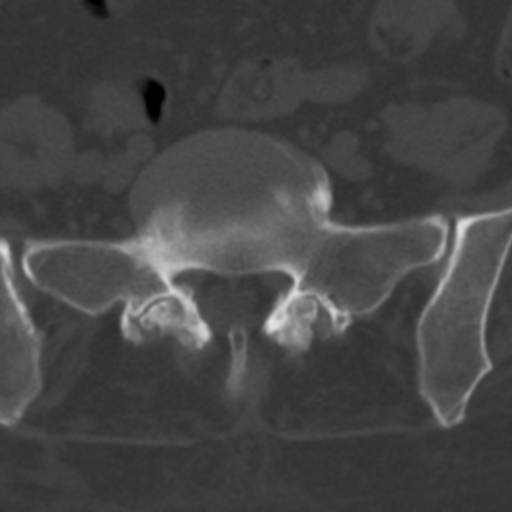
[im 24/72  bone]
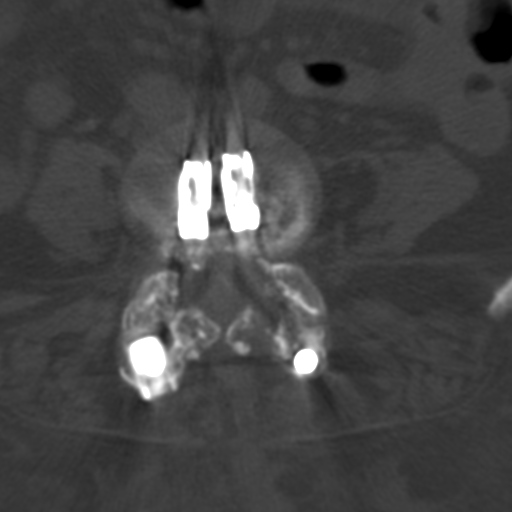
[im 36/72  bone]
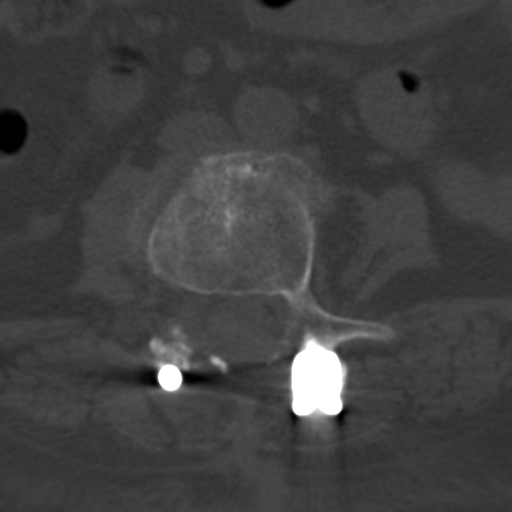
[im 48/72  bone]
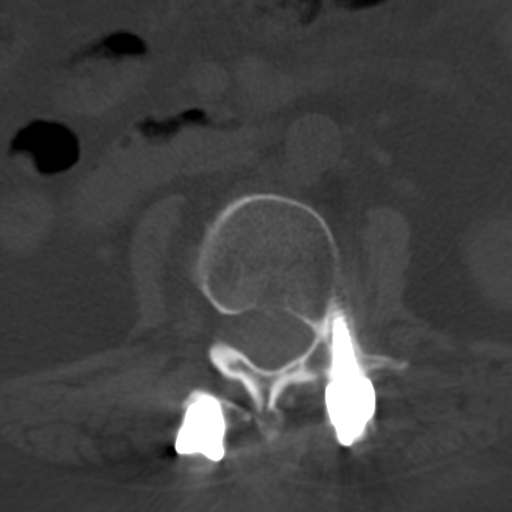
[im 60/72  soft-tissue]
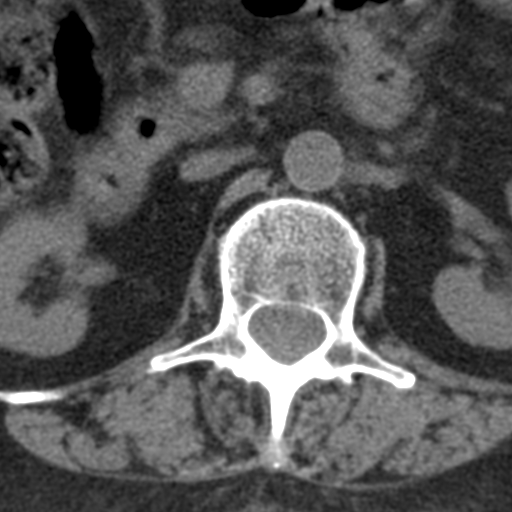
[im 60/72  bone]
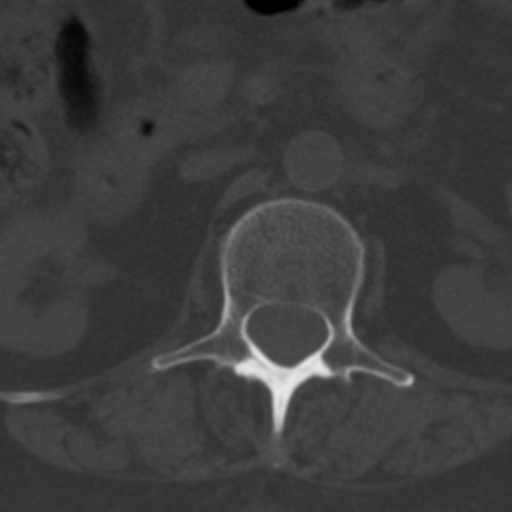

[5 of 14 positions shown; findings below may reference images not displayed]

FINDINGS: Segmentation: 5 lumbar type vertebrae.

Alignment: No static subluxation.

Vertebrae: There is posterior spinal fusion hardware extending from
L2-L5 with bilateral spinal rods and transpedicular screws. There
bilateral laminectomies at L3 and L4. There is disc spacer material
at L2-L3, L3-L4 and L4-L5. There is mild lucency surrounding the
left L2 screw and the left L5 screw. No abnormal lucency about any
of the other hardware.

Paraspinal and other soft tissues: Atherosclerotic calcification
within the abdominal aorta.

Disc levels:

T12-L1: Moderate facet hypertrophy. No disc bulge. No spinal canal
or neural foraminal stenosis.

L1-L2: Partially calcified left subarticular disc protrusion
narrowing the left lateral recess, unchanged. No neural foraminal
stenosis.

L2-L3: Disc spacer without abnormal subsidence. No anterior osseous
fusion. Posterior decompression without residual spinal canal
stenosis. No foraminal stenosis.

L3-L4: No abnormal spacer subsidence. Partial osseous fusion
anteriorly. Posterior decompression without residual spinal canal
stenosis. Moderate narrowing of the left neural foramen, primarily
due to endplate osteophyte at L3, unchanged.

L4-L5: Mild spacer subsidence. No anterior osseous fusion. No spinal
canal stenosis. Mild foraminal narrowing.

L5-S1:  No spinal canal or neural foraminal stenosis.
IMPRESSION: 1. Minimal lucency surrounding the left transpedicular screws at L2
and L5 could indicate a small degree of loosening. Otherwise normal
appearance of L2-L5 posterior spinal fusion.
2. No residual spinal canal stenosis.
3. Unchanged moderate left L3-L4 neural foraminal stenosis, largely
caused by an inferior L2-L3 endplate osteophyte.
4. Unchanged partially calcified left subarticular L1-L2 disc
protrusion with associated narrowing of the left lateral recess.

## 2018-11-14 ENCOUNTER — Ambulatory Visit (INDEPENDENT_AMBULATORY_CARE_PROVIDER_SITE_OTHER): Payer: Medicare Other | Admitting: Gastroenterology

## 2018-11-14 ENCOUNTER — Encounter: Payer: Self-pay | Admitting: Gastroenterology

## 2018-11-14 VITALS — BP 138/82 | HR 61 | Ht 62.0 in | Wt 193.0 lb

## 2018-11-14 DIAGNOSIS — R1310 Dysphagia, unspecified: Secondary | ICD-10-CM | POA: Diagnosis not present

## 2018-11-14 DIAGNOSIS — R1011 Right upper quadrant pain: Secondary | ICD-10-CM

## 2018-11-14 MED ORDER — OMEPRAZOLE 40 MG PO CPDR: 40.0000 mg | DELAYED_RELEASE_CAPSULE | Freq: Two times a day (BID) | ORAL | 3 refills | Status: AC

## 2018-11-14 NOTE — Patient Instructions (Signed)
We have sent the following medications to your pharmacy for you to pick up at your convenience: Omeprazole 40 mg twice a day  You have been scheduled for an endoscopy and colonoscopy. Please follow the written instructions given to you at your visit today. Please pick up your prep supplies at the pharmacy within the next 1-3 days. If you use inhalers (even only as needed), please bring them with you on the day of your procedure. Your physician has requested that you go to www.startemmi.com and enter the access code given to you at your visit today. This web site gives a general overview about your procedure. However, you should still follow specific instructions given to you by our office regarding your preparation for the procedure.  You have been scheduled for an abdominal ultrasound at [facility] (1st floor of hospital) on 11-20-18 at 10:00 am. Please arrive 15 minutes prior to your appointment for registration. Make certain not to have anything to eat or drink 6 hours prior to your appointment. Should you need to reschedule your appointment, please contact radiology at [phone]. This test typically takes about 30 minutes to perform.

## 2018-11-14 NOTE — Progress Notes (Signed)
Referring Provider: Joya Gaskins, MD Primary Care Physician:  Joya Gaskins, MD   Reason for Consultation:  Abdominal pain   IMPRESSION:  Post-prandial right upper quadrant abdominal pain  Dysphagia with history of esophageal stricture dilated 2016 History of colon polyps    - colon polyps on colonoscopy in [place]    - tubular adenoma on colonoscopy 2016 Chronic nausea following gastric bypass Sigmoid diverticulosis Familial polyposis syndrome in her father  Differential for right upper quadrant pain includes reflux, esophageal ulceration associated with prior stricture, peptic ulcer disease, gastritis, or hepatobiliary disease.  Recommending an EGD to evaluate both her dysphasia and the pain.  We will proceed with ultrasound to evaluate hepatobiliary tree.  She may ultimately need a HIDA with CCK.  Trial of higher dose PPI therapy in the meantime.  PLAN: Increase omeprazole to 40 mg BID EGD with possible dilation Abdominal ultrasound HIDA with CCK if ultrasound is non-diagnostic Colonoscopy  I consented the patient at the bedside today discussing the risks, benefits, and alternatives to endoscopic evaluation. In particular, we discussed the risks that include, but are not limited to, reaction to medication, cardiopulmonary compromise, bleeding requiring blood transfusion, aspiration resulting in pneumonia, perforation requiring surgery, lack of diagnosis, severe illness requiring hospitalization, and even death. We reviewed the risk of missed lesion including polyps or even cancer. The patient acknowledges these risks and asks that we proceed.   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 The history is obtained through the patient and review of her electronic health record. She had headaches, radicular neuropathy, hypertension, diabetes, hyperlipidemia, history of depression, nephrolithiasis.  She had  gastric bypass surgery 10 years ago with chronic nausea requiring Phenergan since that time.   Sharp, non-radiating RUQ pain occurring daily. Worse within minutes of eating but not with every meal. Not worse with fatty or greasy foods. No identified triggers. Worried about a fullness in the RUQ that she can palpate - although it is not the source of the pain.  Chronic nausea not worse with pain. No change with position. No change with defecation. She is also concerned about the possibility of diverticulitis.  No other associated symptoms. No identified exacerbating or relieving features.  No NSAIDs.  Intermittent dysphagia over the last year that has become progressively worse. Worse with meats. No trouble with vegetables or water. Symptom despite omeprazole 20 mg BID. Symptoms similar to 2016 when she required dilation for esophageal stricture.  Family history of Barrett's esophagus (paternal aunt and paternal grandmother).   Patient has a family history of colon cancer in distant relatives and a positive family history of colon polyps in her father.  Her mother has also had colon polyps.  She has a personal history of polyps on a colonoscopy in [place] in 2000.  She had a colonoscopy with Dr. Olevia Perches 04/06/2015 revealing an ascending colon tubular adenoma and sigmoid diverticulosis she had an EGD for dysphasia heartburn 04/06/2015 with a benign-appearing nonobstructing esophageal stricture surgery dilated with 16 mm savory.  There was evidence for prior gastrectomy and gastrojejunostomy.  Gastric biopsies were negative for H. pylori.  Past Medical History:  Diagnosis Date  . Anemia   . Cataracts, bilateral   . Chronic back pain    scoliosis/displacement of lumbosacral intervertebral disc/stenosis  . Depression    but doens't take any meds  . Diabetes mellitus without complication ([place])    takes Metformin daily  .  Family history of adverse reaction to anesthesia    pts mom would get short of  breath after anesthesia but was a smoker  . Fibromyalgia    takes Gabapentin daily  . Fusion of lumbar spine   . GERD (gastroesophageal reflux disease)    takes Nexium daily  . Heart murmur   . Hemorrhoids   . History of blood transfusion 2013   no abnormal reaction noted  . History of bronchitis 2009  . History of colon polyps    benign  . History of hiatal hernia   . History of kidney stones    has a kidney stone now  . History of migraine    hasn't had one in over a yr  . History of surgery on arm    plates and screws  . Hypertension   . Hypothyroidism    takes Synthroid daily  . Idiopathic thrombocytopenia ([place])   . Insomnia    takes Restoril nightly as needed   . Memory loss   . Muscle spasm    takes Flexeril daily as needed  . Numbness and tingling    both toes  . PONV (postoperative nausea and vomiting)   . Shortness of breath dyspnea    with exertion but states its bc she can't exercise  . Urinary frequency   . Vitamin D deficiency     Past Surgical History:  Procedure Laterality Date  . ABDOMINAL HYSTERECTOMY    . CARDIAC CATHETERIZATION  67yrs ago  . CATARACT EXTRACTION, BILATERAL    . CERVICAL FUSION    . COLONOSCOPY    . ESOPHAGOGASTRODUODENOSCOPY    . GASTRIC BYPASS    . I&D EXTREMITY Right 10/14/2016   Procedure: IRRIGATION AND DEBRIDEMENT RIGHT HAND;  Surgeon: Dayna Barker, MD;  Location: [facility];  Service: Plastics;  Laterality: Right;  . JOINT REPLACEMENT Bilateral   . kidney stone removed    . KNEE ARTHROSCOPY     total of 7 between both knees  . left arm surgery     pins  . LUMBAR FUSION    . SPINAL CORD STIMULATOR IMPLANT    . TONSILLECTOMY    . tummy tuck       Current Outpatient Medications  Medication Sig Dispense Refill  . Molla Cohosh 40 MG CAPS Take 40 mg by mouth 2 (two) times daily.    . butalbital-acetaminophen-caffeine (FIORICET, ESGIC) 50-325-40 MG tablet Take 1 tablet by mouth every 6 (six) hours as needed for headache. Do  not refill in less than 30 days 12 tablet 5  . cyclobenzaprine (FLEXERIL) 10 MG tablet Take 10 mg by mouth 3 (three) times daily as needed for muscle spasms.    Marland Kitchen gabapentin (NEURONTIN) 300 MG capsule Take 300 mg by mouth 3 (three) times daily.    Marland Kitchen levothyroxine (SYNTHROID, LEVOTHROID) 50 MCG tablet     . metFORMIN (GLUCOPHAGE) 500 MG tablet Take by mouth daily with breakfast.    . omeprazole (PRILOSEC) 20 MG capsule Take 1 capsule (20 mg total) by mouth 2 (two) times daily before a meal. 60 capsule 6  . Oxycodone HCl 20 MG TABS Take 1 tablet (20 mg total) by mouth 3 (three) times daily as needed (for pain). 90 tablet 0  . promethazine (PHENERGAN) 25 MG tablet Take 25 mg by mouth every 6 (six) hours as needed for nausea or vomiting.    . propranolol (INDERAL) 40 MG tablet Take 1 tablet (40 mg total) by mouth 2 (two) times  daily. 60 tablet 11  . temazepam (RESTORIL) 30 MG capsule Take 30 mg by mouth at bedtime.  1  . Vitamin D, Ergocalciferol, (DRISDOL) 50000 units CAPS capsule Take 50,000 Units by mouth 2 (two) times a week.      No current facility-administered medications for this visit.     Allergies as of 11/14/2018 - Review Complete 11/14/2018  Allergen Reaction Noted  . Formaldehyde Shortness Of Breath 08/13/2010  . Altace [ramipril] Cough 03/01/2015  . Piperacillin-tazobactam in dex Other (See Comments) 10/30/2016  . Dilaudid [hydromorphone] Itching 05/03/2016  . Morphine Hives   . Other Itching and Other (See Comments) 10/12/2016  . Zosyn [piperacillin sod-tazobactam so] Itching and Other (See Comments) 10/12/2016    Family History  Problem Relation Age of Onset  . Lung cancer Mother   . COPD Mother   . Hypertension Mother   . Depression Mother   . Colon cancer Father   . Lymphoma Paternal Uncle   . Diabetes Maternal Grandmother        x 5 uncles  . Heart disease Other        Maternal and Paternal  . Throat cancer Maternal Uncle   . Stomach cancer Neg Hx   .  Pancreatic cancer Neg Hx     Social History   Socioeconomic History  . Marital status: Single    Spouse name: Not on file  . Number of children: 1  . Years of education: 76  . Highest education level: Not on file  Occupational History  . Occupation: Retired Therapist, sports / Disabled  Social Needs  . Financial resource strain: Not on file  . Food insecurity:    Worry: Not on file    Inability: Not on file  . Transportation needs:    Medical: Not on file    Non-medical: Not on file  Tobacco Use  . Smoking status: Never Smoker  . Smokeless tobacco: Never Used  Substance and Sexual Activity  . Alcohol use: No    Alcohol/week: 0.0 standard drinks  . Drug use: No  . Sexual activity: Yes    Birth control/protection: Surgical  Lifestyle  . Physical activity:    Days per week: Not on file    Minutes per session: Not on file  . Stress: Not on file  Relationships  . Social connections:    Talks on phone: Not on file    Gets together: Not on file    Attends religious service: Not on file    Active member of club or organization: Not on file    Attends meetings of clubs or organizations: Not on file    Relationship status: Not on file  . Intimate partner violence:    Fear of current or ex partner: Not on file    Emotionally abused: Not on file    Physically abused: Not on file    Forced sexual activity: Not on file  Other Topics Concern  . Not on file  Social History Narrative   Lives at home alone.   Right handed.   Pot of coffee each morning.    Review of Systems: 12 system ROS is negative except as noted above with the additions of allergies, arthritis, chronic back pain, recent evaluation for abnormal mammogram, headaches, heart murmur, itching, muscle pains, frequent urination.  Filed Weights   11/14/18 1040  Weight: 193 lb (87.5 kg)    Physical Exam: Vital signs were reviewed. General:   Alert, well-nourished, pleasant and cooperative in NAD  Head:  Normocephalic and  atraumatic. Eyes:  Sclera clear, no icterus.   Conjunctiva pink. Mouth:  No deformity or lesions.   Neck:  Supple; no thyromegaly. Lungs:  Clear throughout to auscultation.   No wheezes.  Heart:  Regular rate and rhythm; no murmurs Abdomen:  Soft, nontender, normal bowel sounds. No rebound or guarding. No hepatosplenomegaly Rectal:  Deferred  Msk:  Symmetrical without gross deformities. Extremities:  No gross deformities or edema. Neurologic:  Alert and  oriented x4;  grossly nonfocal Skin:  No rash or bruise. Psych:  Alert and cooperative. Normal mood and affect.   Corbet Hanley L. Tarri Glenn, MD, MPH [facility] 11/14/2018, 10:59 AM

## 2018-11-19 ENCOUNTER — Other Ambulatory Visit: Payer: Self-pay | Admitting: Neurosurgery

## 2018-11-19 DIAGNOSIS — M5416 Radiculopathy, lumbar region: Secondary | ICD-10-CM

## 2018-11-20 ENCOUNTER — Ambulatory Visit (HOSPITAL_COMMUNITY)
Admission: RE | Admit: 2018-11-20 | Discharge: 2018-11-20 | Disposition: A | Discharge status: Home / Self Care | Payer: Medicare Other | Source: Ambulatory Visit | Attending: Gastroenterology | Admitting: Gastroenterology

## 2018-11-20 DIAGNOSIS — R1011 Right upper quadrant pain: Secondary | ICD-10-CM | POA: Diagnosis present

## 2018-11-20 DIAGNOSIS — R1310 Dysphagia, unspecified: Secondary | ICD-10-CM | POA: Diagnosis present

## 2018-11-21 ENCOUNTER — Other Ambulatory Visit: Payer: Self-pay

## 2018-11-21 DIAGNOSIS — R1011 Right upper quadrant pain: Secondary | ICD-10-CM

## 2018-11-21 NOTE — Progress Notes (Signed)
hida

## 2018-11-25 ENCOUNTER — Encounter: Payer: Self-pay | Admitting: Gastroenterology

## 2018-11-25 ENCOUNTER — Ambulatory Visit (AMBULATORY_SURGERY_CENTER): Payer: Medicare Other | Admitting: Gastroenterology

## 2018-11-25 VITALS — BP 128/64 | HR 65 | Temp 98.0°F | Resp 14 | Ht 62.0 in | Wt 193.0 lb

## 2018-11-25 DIAGNOSIS — R1084 Generalized abdominal pain: Secondary | ICD-10-CM

## 2018-11-25 DIAGNOSIS — K317 Polyp of stomach and duodenum: Secondary | ICD-10-CM

## 2018-11-25 DIAGNOSIS — R1310 Dysphagia, unspecified: Secondary | ICD-10-CM

## 2018-11-25 DIAGNOSIS — Z860100 Personal history of colon polyps, unspecified: Secondary | ICD-10-CM

## 2018-11-25 DIAGNOSIS — R1011 Right upper quadrant pain: Secondary | ICD-10-CM | POA: Diagnosis not present

## 2018-11-25 DIAGNOSIS — Z538 Procedure and treatment not carried out for other reasons: Secondary | ICD-10-CM | POA: Diagnosis not present

## 2018-11-25 MED ORDER — SODIUM CHLORIDE 0.9 % IV SOLN: 500.0000 mL | Freq: Once | INTRAVENOUS | Status: DC

## 2018-11-25 NOTE — Progress Notes (Signed)
Called to room to assist during endoscopic procedure.  Patient ID and intended procedure confirmed with present staff. Received instructions for my participation in the procedure from the performing physician.  

## 2018-11-25 NOTE — Op Note (Addendum)
[facility] Patient Name: [name] Procedure Date: 11/25/2018 2:05 PM MRN: [id number] Endoscopist: Thornton Park MD, MD Age: 68 Referring MD:  Date of Birth: [birth year] Gender: Female Account #: [id number] Procedure:                Upper GI endoscopy Indications:              Abdominal pain in the right upper quadrant,                            Dysphagia Medicines:                See the Anesthesia note for documentation of the                            administered medications Procedure:                Pre-Anesthesia Assessment:                           - Prior to the procedure, a History and Physical                            was performed, and patient medications and                            allergies were reviewed. The patient's tolerance of                            previous anesthesia was also reviewed. The risks                            and benefits of the procedure and the sedation                            options and risks were discussed with the patient.                            All questions were answered, and informed consent                            was obtained. Prior Anticoagulants: The patient has                            taken no previous anticoagulant or antiplatelet                            agents. ASA Grade Assessment: II - A patient with                            mild systemic disease. After reviewing the risks                            and benefits, the patient was deemed in  satisfactory condition to undergo the procedure.                           After obtaining informed consent, the endoscope was                            passed under direct vision. Throughout the                            procedure, the patient's blood pressure, pulse, and                            oxygen saturations were monitored continuously. The                            Model GIF-HQ190 [id number]) scope was introduced                             through the mouth, and advanced to the third part                            of duodenum. The upper GI endoscopy was                            accomplished without difficulty. The patient                            tolerated the procedure well. Scope In: Scope Out: Findings:                 The upper esophageal sphincter felt tight. The                            esophagus was normal. There was no resistance to                            the gastroscope at any point. No ring, web,                            stricture, or esophagitis. No mucosal or luminal                            abnormalities identified.                           Findings consistent with gastric bypass were                            identified. The anastomosis appears normal. The                            entire examined gastric pouch remained normal.                            Biopsies were taken with a cold forceps for  histology. Estimated blood loss was minimal.                           The examined duodenum was normal.                           The cardia and gastric fundus were normal on                            retroflexion. Complications:            No immediate complications. Estimated blood loss:                            Minimal. Estimated Blood Loss:     None. Impression:               - Normal esophagus.                           - Gastric bypass anatomy. Normal appearing                            anastomosis.                           - Normal stomach. Biopsied.                           - Normal examined duodenum. Recommendation:           - Patient has a contact number available for                            emergencies. The signs and symptoms of potential                            delayed complications were discussed with the                            patient. Return to normal activities tomorrow.                            Written  discharge instructions were provided to the                            patient.                           - Resume regular diet today.                           - Continue present medications.                           - Await pathology results.                           - No repeat upper endoscopy planned at this time.                           -  Proceed with colonoscopy today as previously                            planned. Thornton Park MD, MD 11/25/2018 2:44:25 PM This report has been signed electronically.

## 2018-11-25 NOTE — Patient Instructions (Signed)
Thank you or allowing [name] to care for you todau.  Call us when you have your calendar to re-schedule the pre-visit and colonoscopy,  Resume previous diet and medications today,.  May return to normal activities tomorrow.      YOU HAD AN ENDOSCOPIC PROCEDURE TODAY AT [facility]:   Refer to the procedure report that was given to you for any specific questions about what was found during the examination.  If the procedure report does not answer your questions, please call your gastroenterologist to clarify.  If you requested that your care partner not be given the details of your procedure findings, then the procedure report has been included in a sealed envelope for you to review at your convenience later.  YOU SHOULD EXPECT: Some feelings of bloating in the abdomen. Passage of more gas than usual.  Walking can help get rid of the air that was put into your GI tract during the procedure and reduce the bloating. If you had a lower endoscopy (such as a colonoscopy or flexible sigmoidoscopy) you may notice spotting of blood in your stool or on the toilet paper. If you underwent a bowel prep for your procedure, you may not have a normal bowel movement for a few days.  Please Note:  You might notice some irritation and congestion in your nose or some drainage.  This is from the oxygen used during your procedure.  There is no need for concern and it should clear up in a day or so.  SYMPTOMS TO REPORT IMMEDIATELY:   Following lower endoscopy (colonoscopy or flexible sigmoidoscopy):  Excessive amounts of blood in the stool  Significant tenderness or worsening of abdominal pains  Swelling of the abdomen that is new, acute  Fever of 100F or higher   Following upper endoscopy (EGD)  Vomiting of blood or coffee ground material  New chest pain or pain under the shoulder blades  Painful or persistently difficult swallowing  New shortness of breath  Fever of 100F or  higher  Furry, tarry-looking stools  For urgent or emergent issues, a gastroenterologist can be reached at any hour by calling [phone].   DIET:  We do recommend a small meal at first, but then you may proceed to your regular diet.  Drink plenty of fluids but you should avoid alcoholic beverages for 24 hours.  ACTIVITY:  You should plan to take it easy for the rest of today and you should NOT DRIVE or use heavy machinery until tomorrow (because of the sedation medicines used during the test).    FOLLOW UP: Our staff will call the number listed on your records the next business day following your procedure to check on you and address any questions or concerns that you may have regarding the information given to you following your procedure. If we do not reach you, we will leave a message.  However, if you are feeling well and you are not experiencing any problems, there is no need to return our call.  We will assume that you have returned to your regular daily activities without incident.  If any biopsies were taken you will be contacted by phone or by letter within the next 1-3 weeks.  Please call us at [phone] if you have not heard about the biopsies in 3 weeks.    SIGNATURES/CONFIDENTIALITY: You and/or your care partner have signed paperwork which will be entered into your electronic medical record.  These signatures attest to the fact that  that the information above on your After Visit Summary has been reviewed and is understood.  Full responsibility of the confidentiality of this discharge information lies with you and/or your care-partner.

## 2018-11-25 NOTE — Progress Notes (Signed)
PT taken to PACU. Monitors in place. VSS. Report given to RN. 

## 2018-11-25 NOTE — Progress Notes (Signed)
Pt's states no medical or surgical changes since previsit or office visit.  Patient states that she "doesn't only want propofol.  She wants versed and fentanyl.  She will speak to her CRNA.

## 2018-11-25 NOTE — Op Note (Signed)
[facility] Patient Name: [name] Procedure Date: 11/25/2018 2:05 PM MRN: [id number] Endoscopist: Thornton Park MD, MD Age: 68 Referring MD:  Date of Birth: 1951-06-14 Gender: Female Account #: [id number] Procedure:                Colonoscopy Indications:              RUQ abdominal pain. Personal history of colon                            polyps. Medicines:                See the Anesthesia note for documentation of the                            administered medications Procedure:                Pre-Anesthesia Assessment:                           - Prior to the procedure, a History and Physical                            was performed, and patient medications and                            allergies were reviewed. The patient's tolerance of                            previous anesthesia was also reviewed. The risks                            and benefits of the procedure and the sedation                            options and risks were discussed with the patient.                            All questions were answered, and informed consent                            was obtained. Prior Anticoagulants: The patient has                            taken no previous anticoagulant or antiplatelet                            agents. ASA Grade Assessment: II - A patient with                            mild systemic disease. After reviewing the risks                            and benefits, the patient was deemed in  satisfactory condition to undergo the procedure.                           After obtaining informed consent, the colonoscope                            was passed under direct vision. Throughout the                            procedure, the patient's blood pressure, pulse, and                            oxygen saturations were monitored continuously. The                            Colonoscope was introduced through the anus with                         the intention of advancing to the ileum. The scope                            was advanced to the sigmoid colon before the                            procedure was aborted. Medications were given. The                            colonoscopy was technically difficult and complex                            due to inadequate bowel prep. The patient tolerated                            the procedure well. The quality of the bowel                            preparation was inadequate. Scope In: 2:33:25 PM Scope Out: 2:34:28 PM Total Procedure Duration: 0 hours 1 minute 3 seconds  Findings:                 The perianal and digital rectal examinations were                            normal.                           Extensive amounts of stool was found in the rectum,                            in the recto-sigmoid colon and in the distal                            sigmoid colon, precluding visualization. Complications:            No immediate complications. Estimated Blood Loss:  Estimated blood loss: none. Impression:               - Preparation of the colon was inadequate.                           - Stool in the rectum, in the recto-sigmoid colon                            and in the distal sigmoid colon.                           - No specimens collected. Recommendation:           - Patient has a contact number available for                            emergencies. The signs and symptoms of potential                            delayed complications were discussed with the                            patient. Return to normal activities tomorrow.                            Written discharge instructions were provided to the                            patient.                           - Clear liquid diet today. Additional bowel prep                            tonight.                           - Continue present medications.                           - Repeat  colonoscopy tomorrow because the bowel                            preparation was poor. Thornton Park MD, MD 11/25/2018 2:53:10 PM This report has been signed electronically.

## 2018-11-26 ENCOUNTER — Telehealth: Payer: Self-pay | Admitting: *Deleted

## 2018-11-26 NOTE — Telephone Encounter (Signed)
  Follow up Call-  Call back number 11/25/2018  Post procedure Call Back phone  # [phone]  Permission to leave phone message Yes  Some recent data might be hidden     Patient questions:  Message left to call us if necessary.

## 2018-11-26 NOTE — Telephone Encounter (Signed)
  Follow up Call-  Call back number 11/25/2018  Post procedure Call Back phone  # [phone]  Permission to leave phone message Yes  Some recent data might be hidden     Patient questions:  Message left to call us if necessary.  Second call.

## 2018-12-01 ENCOUNTER — Other Ambulatory Visit: Payer: Medicare Other

## 2018-12-02 ENCOUNTER — Encounter: Payer: Self-pay | Admitting: Gastroenterology

## 2018-12-05 ENCOUNTER — Ambulatory Visit (HOSPITAL_COMMUNITY)
Admission: RE | Admit: 2018-12-05 | Discharge: 2018-12-05 | Disposition: A | Discharge status: Home / Self Care | Payer: Medicare Other | Source: Ambulatory Visit | Attending: Gastroenterology | Admitting: Gastroenterology

## 2018-12-05 DIAGNOSIS — R1011 Right upper quadrant pain: Secondary | ICD-10-CM | POA: Diagnosis not present

## 2018-12-05 MED ORDER — TECHNETIUM TC 99M MEBROFENIN IV KIT
5.4000 | PACK | Freq: Once | INTRAVENOUS | Status: AC | PRN
  Administered 2018-12-05: 5.4 via INTRAVENOUS

## 2018-12-30 ENCOUNTER — Ambulatory Visit: Payer: Medicare Other | Admitting: Neurology

## 2019-01-01 ENCOUNTER — Telehealth: Payer: Self-pay | Admitting: *Deleted

## 2019-01-01 NOTE — Telephone Encounter (Signed)
PA approved through 01/01/2020.

## 2019-01-01 NOTE — Telephone Encounter (Signed)
PA for [employer] started via covermymeds (key: [id number]).  She has coverage with [employer] [phone]).  Pt [id number].  Decision pending.

## 2019-01-28 ENCOUNTER — Encounter: Payer: Self-pay | Admitting: Neurology

## 2019-01-28 ENCOUNTER — Other Ambulatory Visit: Payer: Self-pay

## 2019-01-28 ENCOUNTER — Ambulatory Visit (INDEPENDENT_AMBULATORY_CARE_PROVIDER_SITE_OTHER): Payer: Medicare Other | Admitting: Neurology

## 2019-01-28 DIAGNOSIS — R51 Headache: Secondary | ICD-10-CM

## 2019-01-28 DIAGNOSIS — R519 Headache, unspecified: Secondary | ICD-10-CM

## 2019-01-28 DIAGNOSIS — G8929 Other chronic pain: Secondary | ICD-10-CM

## 2019-01-28 NOTE — Progress Notes (Signed)
Virtual Visit via Telephone Note  I connected with [name] on 01/28/19 at  3:15 PM EDT by telephone and verified that I am speaking with the correct person using two identifiers.   I discussed the limitations, risks, security and privacy concerns of performing an evaluation and management service by telephone and the availability of in person appointments. I also discussed with the patient that there may be a patient responsible charge related to this service. The patient expressed understanding and agreed to proceed.   History of Present Illness: [name] is a 68 years old right-handed female, seen in refer by her primary care physician Dr.Janice Theola Sequin  for evaluation of one episode of sudden onset left-sided weakness, difficulty texting, Initial evaluation was in November 21st 2016.  She had a history of hypertension, diabetes since 2012, hyperlipidemia, past history of depression, kidney stone, two lumbar decompression surgery in May 2016 by neurosurgeon Dr. Joya Salm, bilateral knee replacements, She had cervical decompression surgery in 2009, by Dr. Joya Salm, prior to surgery, she had neck pain, shooting pain to her spine,   In July 25 2015, while his she was texting using her phone, she noticed she typed the wrong letters, also noticed mild left arm weakness, when she called her aunt, she was noticed to have slurred speech, her aunt was a Equities trader, who came by to check on the patient, noticed that she was mildly weak on the left arm and leg, mild slurred speech, initial weakness last about 30 minutes, she felt tired, took a nap,  Since the incident, she continue complains of fatigue,                                                                                               She also complains of gradual onset mild memory trouble, missing her routine lunch appointment with her family, tends to take the note Laboratory evaluation showed normal B12, TSH,  UPDATE  Nov 09 2015: Her left side numbness weakness is getting better. She still has right lumbar radiculopathy, radiating pain has improved post lumbar decompression surgery in May 2016, getting worse after bearing weight.  She has frequent bilateral legs, left hand muscle spasm, she does have neck pain, hx of cervical fusion in the past.  She has tried valium, flexeril, without helping her symptoms  MRI of the brain in December 2016 that was normal, MRI of lumbar in 2015 prior to lumbar decompression surgery in May 2016, multi-level degenerative disc disease, most severe at L4-5, L3 and 4, with moderate to severe bilateral foraminal stenosis, mild central canal stenosis  Echocardiogram was within normal limit, ultrasound of carotid artery showed no significant abnormality  Laboratory evaluation, normal CMP, glucose 106 mild elevated, creatinine was 1.0, normal CBC, with hemoglobin of 10 point 2, mild decrease, magnesium 1.8 T1 was within normal limits 7, folic acid 8.9 B71 more than 1700, A1c was 5.4, normal thyroid functional task, TSH, cholesterol 166 LDL 23 HDL was 133, normal iron panel  Update Feb 16 2016: EMG nerve conduction study March 2017: Showed evidence of mild chronic left lumbar radiculopathy  mainly involving left L4-5 myotomes. There is no evidence of right upper extremity neuropathy or right cervical radiculopathy.  She complains of chronic low back pain neck pain, radiating pain to bilateral upper extremity, had a history of cervical decompression surgery in 2009 by Dr. Joya Salm after heavy lifting, radiating pain to her spine sounds like cervical myelopathy  Trileptal 150 mg every night has helped her muscle cramping  UPDATE Sept 25th 2017: She fell twice on April 30 2016, fell at her closet at evening time, she denied confusion, no loss of consciousness, but could not reach her phone asking for help until 3 days later on May 03 2016, eventually she was able to knock over a table  get to her phone to call 911, she was admitted to the hospital, was found to have hyponatremia, hyperkalemia, acute renal failure, metabolic derangements, suspected that patient presented with a component of adrenal insufficiency with abrupt discontinuation of the steroid, she was on tapering dose of steroid by her dermatologist for skin rash at her back, with a diagnosis of lichen planus, she was discharged to rehabilitation [place] [facility] for 2 weeks, now she is back home, she lives alone.  She has lost 33 Lbs in 6 weeks, she now complains of worsening balance, she also complains of neck pain,[place] signs,  Increased bilateral lower extremity swelling, rashes,   She has worsening bilateral lower extremity paresthesia, worsening chronic constipation, worsening urinary urgency, occasionally incontinence, we have personally reviewed MRI cervical spine May 2017: Multilevel degenerative changes, evidence of previous fusion anterior fusion Z6-1,-0,-9, T1 with metallic hardware  1. At C3-4: disc bulging and uncovertebral joint hypertrophy with moderate spinal stenosis and severe biforaminal stenosis. 2. At At C4-5, C5-6, C7-T1: uncovertebral joint hypertrophy with severe biforaminal stenosis. 3. At U0-4: metallic artifact projecting posteriorly into the spinal canal; uncovertebral joint hypertrophy and facet hypertrophy with severe biforaminal stenosis.  Reviewed laboratory evaluations, A1c 5.5, CPK was elevated 545, normal CBC with hemoglobin of 11 point 5, upon admission on May 03 2016, potassium 6.0, glucose 146,sodium 129, chloride 18, GFR 17, normal V40 981, folic acid 19.1, ferritin 48, decreased iron 21  UPDATE Feb 8th 2018: I reviewed and summarized hospital admission in January 2018, she was admitted for pulmonary emboli, she presented with frequent fall, worsening shortness of breath, she also had right hand abscess, was treated with I&D, vancomycin, discharged on by mouth doxycycline,  I  personally reviewed CT head without contrast in January 2018 that was normal, CT angiogram for PE protocol showed pulmonary emboli in several proximal right upper lobe pulmonary arterial branch, as well in the posterior segment of right upper lobe pulmonary arterial branch,  She was seen by Dr .Benetta Spar, and [facility] for evaluation of abnormal cervical spine, both seemed she is not a surgical candidate,    She complains of intermittent falling episodes, on further questioning,  she denies significant gait abnormality, but sometimes she has sudden onset unwarning falling, she described one episode shortly after a big lunch, she was standing talking with her aunt, she fell without warning signs, there was one episode of after watching TV for a few hours, she got up bending over then fell forward.   Today there was moderate orthostatic blood pressure changes, lying down blood pressure 148/70, heart rate of 90, standing up 129/87 heart rate of 110.  She had gestational diabetes, over the past few years, also developed abnormal glucose, on diet only, she has been taking Lyrica for fibromyalgia, denies significant lower  extremity paresthesia, she does endorse occasionally orthostatic dizziness  UPDATE Mar 06 2017: She has not falling since last visit in Feb 2018, she complains of dizziness with neck hyperextension.  She also complains of hand pain, shooting pain in left thumb,  Multiple joints pain, bilateral ankle pain and swelling   UPDATE May 15 2017: She was admitted to the hospital on July 25-26 for progressive dyspnea on exertion, was found to have declining hemoglobin 7.3, she denies signs of bleeding, she was taking anticoagulation because of pulmonary emboli in January 2018, iron deficiency anemia, she received 2 units of blood transfusion, symptoms overall has much improved, she will continue her outpatient workup, anticoagulation was discontinued.  Her dizziness overall has much  improved with blood transfusion The DVT and PE was considered due to hormone treatment, she was still taking estrogen prior to the development of PE in January, which has been discontinued.  She continue complains of bilateral lower extremity swelling, despite multiple rounds of antibiotics, was told it was not cellulitis, worry about autoimmune skin condition, rheumatologist appointment is pending.  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  Chronic calcified change at bilateral basal ganglion, no significant change compared to previous scan in January 2018  EEG was normal on May 08, 2018.  January 28, 2019 SS: Persistent headaches, history of migraine headache, rebound medication component with daily use of Tylenol; taking propanolol 40 mg twice a day, gabapentin 300 mg 2 tablets 3 times a day, added on Aimovig 70 mg once a month, Fioricet as needed  She reports she saw benefit with Aimovig.  She did give her 2  injections, then had to stop medication because she fell into the donut hole for [employer], was going to be very expensive.  She does think her headaches have improved.  She reports she may have about 3 spells per month where she will develop headache that lasts for several days.  She will take Fioricet, with benefit.  She has stopped taking as much Tylenol.  At times she will go a week without any headache.  She does work night shift as a Marine scientist, she has noticed that working nights makes her headaches worse.   Observations/Objective: Alert, answers questions appropriately, knowledgeable of health condition, speech is clear and concise  Assessment and Plan: 1.  Migraine headaches  Overall she reports her headaches have improved.  She will continue taking propanolol 40 mg twice daily, gabapentin 300 mg, 2 tablets 3 times a day, Fioricet as needed for acute headache.  She will continue to limit her Tylenol consumption.  Aimovig was beneficial for her however she fell into the donut hole and was unable to afford.  I gave her information for  [email or web address], to see if she can apply for financial assistance to afford Aimovig.  She will print the paperwork, complete her part, mail it to our office.  Follow Up Instructions: 6 months   I discussed the assessment and treatment plan with the patient. The patient was provided an opportunity to ask questions and all were answered. The patient agreed with the plan and demonstrated an understanding of the instructions.   The patient was advised to call back or seek an in-person evaluation if the symptoms worsen or if the condition fails to improve as anticipated.  I provided 25 minutes of non-face-to-face time during this encounter.   Evangeline Dakin, DNP  [facility] [address], [place],  [address] [phone]

## 2019-01-28 NOTE — Progress Notes (Signed)
I have reviewed and agreed above plan. 

## 2019-01-30 ENCOUNTER — Ambulatory Visit: Payer: Medicare Other | Admitting: Neurology

## 2019-02-19 ENCOUNTER — Telehealth: Payer: Self-pay | Admitting: *Deleted

## 2019-02-19 NOTE — Telephone Encounter (Signed)
LMVM for pt to call us back to reschedule 22mo RV with ss/np around. 07/30/19.   Ok to make appt when she calls.

## 2020-03-08 ENCOUNTER — Other Ambulatory Visit: Payer: Self-pay | Admitting: Neurology

## 2020-03-09 ENCOUNTER — Encounter: Payer: Self-pay | Admitting: Gastroenterology

## 2020-03-09 ENCOUNTER — Telehealth: Payer: Self-pay | Admitting: Neurology

## 2020-03-09 NOTE — Telephone Encounter (Signed)
Pt has called to report that she would like to know if she could have another vv with Sarah,NP due to her falling 5 times within a 2 week time frame.  Pt was reminded of notes from last visit:The patient was advised to call back or seek an in-person evaluation if the symptoms worsen or if the condition fails to improve as anticipated. Please call.

## 2020-03-10 NOTE — Telephone Encounter (Signed)
I called pt and she is states that she is retired and is living at home in [place] now till Fall season.  She saw [facility] neurologist and mentioned issue of falls/ offbalance and was they were not able to address.  I made in office appt with Dr. Krista Blue to evaluate.  04-13-20 at 1130. She will drive here for the appt.  She also mentioned about getting prescription of medication (not sure of name) could be fiorcet refilled at pharmacy to where she is now, as her local pharmacy would not transfer.  She will call back with name and pharmacy.

## 2020-04-04 ENCOUNTER — Other Ambulatory Visit: Payer: Self-pay | Admitting: *Deleted

## 2020-04-04 MED ORDER — BUTALBITAL-APAP-CAFFEINE 50-325-40 MG PO TABS: 1.0000 | ORAL_TABLET | Freq: Four times a day (QID) | ORAL | 5 refills | Status: AC | PRN

## 2020-04-13 ENCOUNTER — Ambulatory Visit (INDEPENDENT_AMBULATORY_CARE_PROVIDER_SITE_OTHER): Payer: Medicare Other | Admitting: Neurology

## 2020-04-13 ENCOUNTER — Encounter: Payer: Self-pay | Admitting: Neurology

## 2020-04-13 VITALS — BP 181/67 | HR 56 | Ht 62.0 in | Wt 181.5 lb

## 2020-04-13 DIAGNOSIS — G8929 Other chronic pain: Secondary | ICD-10-CM

## 2020-04-13 DIAGNOSIS — R269 Unspecified abnormalities of gait and mobility: Secondary | ICD-10-CM | POA: Diagnosis not present

## 2020-04-13 DIAGNOSIS — M79604 Pain in right leg: Secondary | ICD-10-CM | POA: Diagnosis not present

## 2020-04-13 DIAGNOSIS — R519 Headache, unspecified: Secondary | ICD-10-CM | POA: Diagnosis not present

## 2020-04-13 DIAGNOSIS — M79605 Pain in left leg: Secondary | ICD-10-CM | POA: Insufficient documentation

## 2020-04-13 NOTE — Progress Notes (Signed)
HISTORICAL [name] is a 69 years old right-handed female, seen in refer by her primary care physician Dr.Janice Theola Sequin  for evaluation of one episode of sudden onset left-sided weakness, difficulty texting, Initial evaluation was in November 21st 2016.  She had a history of hypertension, diabetes since 2012, hyperlipidemia, past history of depression, kidney stone, two lumbar decompression surgery in May 2016 by neurosurgeon Dr. Joya Salm, bilateral knee replacements, She had cervical decompression surgery in 2009, by Dr. Joya Salm, prior to surgery, she had neck pain, shooting pain to her spine,   In July 25 2015, while his she was texting using her phone, she noticed she typed the wrong letters, also noticed mild left arm weakness, when she called her aunt, she was noticed to have slurred speech, her aunt was a Equities trader, who came by to check on the patient, noticed that she was mildly weak on the left arm and leg, mild slurred speech, initial weakness last about 30 minutes, she felt tired, took a nap,  Since the incident, she continue complains of fatigue,                                                                                               She also complains of gradual onset mild memory trouble, missing her routine lunch appointment with her family, tends to take the note Laboratory evaluation showed normal B12, TSH,  UPDATE Nov 09 2015: Her left side numbness weakness is getting better. She still has right lumbar radiculopathy, radiating pain has improved post lumbar decompression surgery in May 2016, getting worse after bearing weight.  She has frequent bilateral legs, left hand muscle spasm, she does have neck pain, hx of cervical fusion in the past.  She has tried valium, flexeril, without helping her symptoms  MRI of the brain in December 2016 that was normal, MRI of lumbar in 2015 prior to lumbar decompression surgery in May 2016, multi-level degenerative  disc disease, most severe at L4-5, L3 and 4, with moderate to severe bilateral foraminal stenosis, mild central canal stenosis  Echocardiogram was within normal limit, ultrasound of carotid artery showed no significant abnormality  Laboratory evaluation, normal CMP, glucose 106 mild elevated, creatinine was 1.0, normal CBC, with hemoglobin of 10 point 2, mild decrease, magnesium 1.8 T1 was within normal limits 7, folic acid 8.9 L39 more than 1700, A1c was 5.4, normal thyroid functional task, TSH, cholesterol 166 LDL 23 HDL was 133, normal iron panel  Update Feb 16 2016: EMG nerve conduction study March 2017: Showed evidence of mild chronic left lumbar radiculopathy mainly involving left L4-5 myotomes. There is no evidence of right upper extremity neuropathy or right cervical radiculopathy.  She complains of chronic low back pain neck pain, radiating pain to bilateral upper extremity, had a history of cervical decompression surgery in 2009 by Dr. Joya Salm after heavy lifting, radiating pain to her spine sounds like cervical myelopathy  Trileptal 150 mg every night has helped her muscle cramping  UPDATE Sept 25th 2017: She fell twice on April 30 2016, fell at her closet at evening time, she denied confusion,  no loss of consciousness, but could not reach her phone asking for help until 3 days later on May 03 2016, eventually she was able to knock over a table get to her phone to call 911, she was admitted to the hospital, was found to have hyponatremia, hyperkalemia, acute renal failure, metabolic derangements, suspected that patient presented with a component of adrenal insufficiency with abrupt discontinuation of the steroid, she was on tapering dose of steroid by her dermatologist for skin rash at her back, with a diagnosis of lichen planus, she was discharged to rehabilitation [facility] for 2 weeks, now she is back home, she lives alone.  She has lost 33 Lbs in 6 weeks, she now complains of  worsening balance, she also complains of neck pain,[place] signs,  Increased bilateral lower extremity swelling, rashes,   She has worsening bilateral lower extremity paresthesia, worsening chronic constipation, worsening urinary urgency, occasionally incontinence, we have personally reviewed MRI cervical spine May 2017: Multilevel degenerative changes, evidence of previous fusion anterior fusion E7-2,-0,-9, T1 with metallic hardware  1. At C3-4: disc bulging and uncovertebral joint hypertrophy with moderate spinal stenosis and severe biforaminal stenosis. 2. At At C4-5, C5-6, C7-T1: uncovertebral joint hypertrophy with severe biforaminal stenosis. 3. At O7-0: metallic artifact projecting posteriorly into the spinal canal; uncovertebral joint hypertrophy and facet hypertrophy with severe biforaminal stenosis.  Reviewed laboratory evaluations, A1c 5.5, CPK was elevated 545, normal CBC with hemoglobin of 11 point 5, upon admission on May 03 2016, potassium 6.0, glucose 146,sodium 129, chloride 18, GFR 17, normal J62 836, folic acid 62.9, ferritin 48, decreased iron 21  UPDATE Feb 8th 2018: I reviewed and summarized hospital admission in January 2018, she was admitted for pulmonary emboli, she presented with frequent fall, worsening shortness of breath, she also had right hand abscess, was treated with I&D, vancomycin, discharged on by mouth doxycycline,  I personally reviewed CT head without contrast in January 2018 that was normal, CT angiogram for PE protocol showed pulmonary emboli in several proximal right upper lobe pulmonary arterial branch, as well in the posterior segment of right upper lobe pulmonary arterial branch,  She was seen by Dr .Benetta Spar, and [facility] for evaluation of abnormal cervical spine, both seemed she is not a surgical candidate,    She complains of intermittent falling episodes, on further questioning,  she denies significant gait abnormality, but sometimes she has  sudden onset unwarning falling, she described one episode shortly after a big lunch, she was standing talking with her aunt, she fell without warning signs, there was one episode of after watching TV for a few hours, she got up bending over then fell forward.   Today there was moderate orthostatic blood pressure changes, lying down blood pressure 148/70, heart rate of 90, standing up 129/87 heart rate of 110.  She had gestational diabetes, over the past few years, also developed abnormal glucose, on diet only, she has been taking Lyrica for fibromyalgia, denies significant lower extremity paresthesia, she does endorse occasionally orthostatic dizziness  UPDATE Mar 06 2017: She has not falling since last visit in Feb 2018, she complains of dizziness with neck hyperextension.  She also complains of hand pain, shooting pain in left thumb,  Multiple joints pain, bilateral ankle pain and swelling   UPDATE May 15 2017: She was admitted to the hospital on July 25-26 for progressive dyspnea on exertion, was found to have declining hemoglobin 7.3, she denies signs of bleeding, she was taking anticoagulation because of pulmonary emboli  in January 2018, iron deficiency anemia, she received 2 units of blood transfusion, symptoms overall has much improved, she will continue her outpatient workup, anticoagulation was discontinued.  Her dizziness overall has much improved with blood transfusion The DVT and PE was considered due to hormone treatment, she was still taking estrogen prior to the development of PE in January, which has been discontinued.  She continue complains of bilateral lower extremity swelling, despite multiple rounds of antibiotics, was told it was not cellulitis, worry about autoimmune skin condition, rheumatologist appointment is pending.  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  Chronic calcified change at bilateral basal ganglion, no significant change compared to previous scan in January 2018  EEG was normal on May 08, 2018.  UPDATE April 13 2020: [name] traveled 4 hours from [place] for today's visit, her migraine headache is overall under good control, she uses Fioricet 2-3 times each week, it is effective in taking care of her migraine,  Today her main concern is unsteady gait, she fell few times, she also reported episode of lightheadedness, woozy sensation in a standing up position, today she was noted to have elevated blood pressure up to 180/70, she denies a previous history of hypertension, not on antihypertensive agent  She also complains of intermittent bilateral lower extremity heaviness, deep achy pain involving anterior thigh, calf muscle after walking short distance, usually improve after she sits down for a while, she did have a history of lumbar decompression surgery in the past, chronic low back pain, under pain management, taking oxycodone 20 mg 3 times a  day,  She denies a history of smoking, denies a history of coronary artery disease,  REVIEW OF SYSTEMS: Full 14 system review of systems performed and notable only for as above All other review of systems were negative.  ALLERGIES: Allergies  Allergen Reactions  . Formaldehyde Shortness Of Breath and Cough  . Altace [Ramipril] Cough  . Morphine Hives  . Piperacillin-Tazobactam In Dex Other (See Comments)  . Hydromorphone Itching and Rash    Drip OK, pills cause itching - can take for a short period of time but nothing extended Drip causing itching - can take for a short period of time but nothing extended Over extended time  . Zosyn [Piperacillin Sod-Tazobactam So] Itching    flushing    HOME MEDICATIONS: Current Outpatient Medications  Medication Sig Dispense Refill  . Biotin 10000 MCG TABS Take 1 tablet by mouth daily.    . Lefkowitz Cohosh 40 MG  CAPS Take 40 mg by mouth 2 (two) times daily.    . butalbital-acetaminophen-caffeine (FIORICET) 50-325-40 MG tablet Take 1 tablet by mouth every 6 (six) hours as needed for headache. Do not refill in less than 30 days 12 tablet 5  . cyclobenzaprine (FLEXERIL) 10 MG tablet Take 10 mg by mouth 3 (three) times daily as needed for muscle spasms.    Marland Kitchen gabapentin (NEURONTIN) 300 MG capsule Take 300 mg by mouth 3 (three) times daily.    Marland Kitchen levothyroxine (SYNTHROID, LEVOTHROID) 50 MCG tablet     . metFORMIN (GLUCOPHAGE) 1000 MG tablet Take 1,000 mg by mouth daily with breakfast.    . omeprazole (PRILOSEC) 40 MG capsule Take 1 capsule (40 mg total) by mouth 2 (two) times daily. 60 capsule 3  . Oxycodone HCl 20 MG TABS Take 1 tablet (20 mg total) by mouth 3 (three) times daily as needed (for pain). 90 tablet 0  . promethazine (PHENERGAN) 25 MG tablet Take 25 mg by mouth every 6 (six) hours as needed for nausea or vomiting.    . propranolol (INDERAL) 40 MG tablet Take 1 tablet (40 mg total) by mouth 2 (two) times daily. Please call [phone] to schedule  an appt for continued refills. 60 tablet 1  . temazepam (RESTORIL) 30 MG capsule Take 30 mg by mouth at bedtime.  1  . VITAMIN D PO Take 1 tablet by mouth daily.     No current facility-administered medications for this visit.    PAST MEDICAL HISTORY: Past Medical History:  Diagnosis Date  . Anemia   . Cataracts, bilateral   . Chronic back pain    scoliosis/displacement of lumbosacral intervertebral disc/stenosis  . Depression    but doens't take any meds  . Diabetes mellitus without complication ([place])    takes Metformin daily  . Family history of adverse reaction to anesthesia    pts mom would get short of breath after anesthesia but was a smoker  . Fibromyalgia    takes Gabapentin daily  . Fusion of lumbar spine   . GERD (gastroesophageal reflux disease)    takes Nexium daily  . Heart murmur   . Hemorrhoids   . History of blood transfusion 2013   no abnormal reaction noted  . History of bronchitis 2009  . History of colon polyps    benign  . History of hiatal hernia   . History of kidney stones    has a kidney stone now  . History of migraine    hasn't had one in over a yr  . History of surgery on arm    plates and screws  . Hypertension   . Hypothyroidism    takes Synthroid daily  . Idiopathic thrombocytopenia ([place])   . Insomnia    takes Restoril nightly as needed   . Memory loss   . Muscle spasm    takes Flexeril daily as needed  . Numbness and tingling    both toes  . PONV (postoperative nausea and vomiting)   . Shortness of breath dyspnea    with exertion but states its bc she can't exercise  . Urinary frequency   . Vitamin D deficiency     PAST SURGICAL HISTORY: Past Surgical History:  Procedure Laterality Date  . ABDOMINAL HYSTERECTOMY    . CARDIAC CATHETERIZATION  35yrs ago  . CATARACT EXTRACTION, BILATERAL    . CERVICAL FUSION    . COLONOSCOPY    . ESOPHAGOGASTRODUODENOSCOPY    . GASTRIC  BYPASS    . I & D EXTREMITY Right 10/14/2016    Procedure: IRRIGATION AND DEBRIDEMENT RIGHT HAND;  Surgeon: Dayna Barker, MD;  Location: [facility];  Service: Plastics;  Laterality: Right;  . JOINT REPLACEMENT Bilateral   . kidney stone removed    . KNEE ARTHROSCOPY     total of 7 between both knees  . left arm surgery     pins  . LUMBAR FUSION    . SPINAL CORD STIMULATOR IMPLANT    . TONSILLECTOMY    . tummy tuck       FAMILY HISTORY: Family History  Problem Relation Age of Onset  . Lung cancer Mother   . COPD Mother   . Hypertension Mother   . Depression Mother   . Colon cancer Father   . Lymphoma Paternal Uncle   . Diabetes Maternal Grandmother        x 5 uncles  . Heart disease Other        Maternal and Paternal  . Throat cancer Maternal Uncle   . Colon cancer Maternal Grandfather   . Stomach cancer Neg Hx   . Pancreatic cancer Neg Hx     SOCIAL HISTORY: Social History   Socioeconomic History  . Marital status: Single    Spouse name: Not on file  . Number of children: 1  . Years of education: 65  . Highest education level: Not on file  Occupational History  . Occupation: Retired Therapist, sports / Disabled  Tobacco Use  . Smoking status: Never Smoker  . Smokeless tobacco: Never Used  Vaping Use  . Vaping Use: Never used  Substance and Sexual Activity  . Alcohol use: No    Alcohol/week: 0.0 standard drinks  . Drug use: No  . Sexual activity: Yes    Birth control/protection: Surgical  Other Topics Concern  . Not on file  Social History Narrative   Lives at home alone.   Right handed.   Pot of coffee each morning.   Social Determinants of Health   Financial Resource Strain:   . Difficulty of Paying Living Expenses:   Food Insecurity:   . Worried About Charity fundraiser in the Last Year:   . Arboriculturist in the Last Year:   Transportation Needs:   . Film/video editor (Medical):   Marland Kitchen Lack of Transportation (Non-Medical):   Physical Activity:   . Days of Exercise per Week:   . Minutes of Exercise per  Session:   Stress:   . Feeling of Stress :   Social Connections:   . Frequency of Communication with Friends and Family:   . Frequency of Social Gatherings with Friends and Family:   . Attends Religious Services:   . Active Member of Clubs or Organizations:   . Attends Archivist Meetings:   Marland Kitchen Marital Status:   Intimate Partner Violence:   . Fear of Current or Ex-Partner:   . Emotionally Abused:   Marland Kitchen Physically Abused:   . Sexually Abused:      PHYSICAL EXAM   Vitals:   04/13/20 1108  BP: (!) 181/67  Pulse: (!) 56  Weight: 181 lb 8 oz (82.3 kg)  Height: 5\' 2"  (1.575 m)   Not recorded     Body mass index is 33.2 kg/m.  PHYSICAL EXAMNIATION:  Gen: NAD, conversant, well nourised, well groomed  Cardiovascular: Regular rate rhythm, no peripheral edema, warm, nontender. Eyes: Conjunctivae clear without exudates or hemorrhage Neck: Supple, no carotid bruits. Pulmonary: Clear to auscultation bilaterally   NEUROLOGICAL EXAM:  MENTAL STATUS: Speech:    Speech is normal; fluent and spontaneous with normal comprehension.  Cognition:     Orientation to time, place and person     Normal recent and remote memory     Normal Attention span and concentration     Normal Language, naming, repeating,spontaneous speech     Fund of knowledge   CRANIAL NERVES: CN II: Visual fields are full to confrontation. Pupils are round equal and briskly reactive to light. CN III, IV, VI: extraocular movement are normal. No ptosis. CN V: Facial sensation is intact to light touch CN VII: Face is symmetric with normal eye closure  CN VIII: Hearing is normal to causal conversation. CN IX, X: Phonation is normal. CN XI: Head turning and shoulder shrug are intact  MOTOR: There is no pronator drift of out-stretched arms. Muscle bulk and tone are normal. Muscle strength is normal.  REFLEXES: Reflexes are 1 and symmetric at the biceps, triceps, knees, and ankles.  Plantar responses are flexor.  SENSORY: Intact to light touch, pinprick and vibratory sensation are intact in fingers and toes.  COORDINATION: There is no trunk or limb dysmetria noted.  GAIT/STANCE: She can get up from seated position arms crossed, steady, difficulty perform tiptoe and heel walking, mild difficulty with tandem walking   DIAGNOSTIC DATA (LABS, IMAGING, TESTING) - I reviewed patient records, labs, notes, testing and imaging myself where available.   ASSESSMENT AND PLAN  AMABEL STMARIE is a 69 y.o. female   Gait abnormality  Reported bilateral lower extremity deep achy pain after short distance, suggestive of claudication, neurogenic versus vascular claudication, right worse than left  Personally reviewed CT head without contrast August 2019 no acute abnormality,  CT lumbar in December 2017, evidence of posterior lumbar spine fusion hardware extending from L2-L5 with bilateral spinal rod, and trace pedicular screws, bilateral laminotomy at L3,4, there was no significant canal or foraminal narrowing  Arterial Doppler study of right lower extremity to rule out vascular insufficiency  Reported unsteadiness sensation could be multifactorial, including hypertension, blood pressure variation related to positional change, medication side effect, chronic low back pain, knee pain, had a history of extensive lumbar decompression surgery, bilateral knee replacement,  Emphasized importance of increased water intake to keep her volume up, moderate daily exercise   Chronic migraine headaches  Overall is under good control, Fioricet as needed  Marcial Pacas, M.D. Ph.D.  [facility] [address] [place], [place] [address] Ph: [phone] Fax: [phone]  CC:  Joya Gaskins, MD [place],  [place] [id number]  Joya Gaskins, MD

## 2020-05-15 ENCOUNTER — Other Ambulatory Visit: Payer: Self-pay | Admitting: Neurology

## 2020-08-23 ENCOUNTER — Other Ambulatory Visit: Payer: Self-pay | Admitting: Emergency Medicine

## 2020-08-23 MED ORDER — PROPRANOLOL HCL 40 MG PO TABS: ORAL_TABLET | ORAL | 1 refills | Status: DC

## 2021-04-05 ENCOUNTER — Telehealth: Payer: Self-pay | Admitting: Neurology

## 2021-04-05 NOTE — Telephone Encounter (Signed)
LVM for patient asking her to call back to r/s due to [name] being out of the office.

## 2021-04-13 ENCOUNTER — Ambulatory Visit: Payer: Medicare Other | Admitting: Neurology

## 2021-08-01 ENCOUNTER — Other Ambulatory Visit: Payer: Self-pay | Admitting: Neurology

## 2021-08-04 ENCOUNTER — Other Ambulatory Visit: Payer: Self-pay | Admitting: Neurology

## 2022-08-06 ENCOUNTER — Ambulatory Visit: Payer: Medicare Other | Admitting: Neurology

## 2022-08-06 ENCOUNTER — Encounter: Payer: Self-pay | Admitting: Neurology

## 2023-01-08 ENCOUNTER — Telehealth: Payer: Self-pay | Admitting: Neurology

## 2023-01-08 NOTE — Telephone Encounter (Signed)
Pt would like to know if in any of the tests she did in the past would have shown if she had early onset of Parkinson's. Please advise.

## 2023-01-14 NOTE — Telephone Encounter (Signed)
Called pt and LVM to call office and schedule appt.  

## 2024-04-24 ENCOUNTER — Encounter: Payer: Self-pay | Admitting: Advanced Practice Midwife
